# Patient Record
Sex: Female | Born: 1950 | Race: White | Hispanic: No | Smoking: Never smoker
Health system: Southern US, Community
[De-identification: ages and names within clinical notes are randomized; demographics above are authoritative.]

## PROBLEM LIST (undated history)

## (undated) DIAGNOSIS — M1990 Unspecified osteoarthritis, unspecified site: Secondary | ICD-10-CM

## (undated) DIAGNOSIS — M549 Dorsalgia, unspecified: Secondary | ICD-10-CM

## (undated) DIAGNOSIS — L719 Rosacea, unspecified: Secondary | ICD-10-CM

## (undated) DIAGNOSIS — T7840XA Allergy, unspecified, initial encounter: Secondary | ICD-10-CM

## (undated) DIAGNOSIS — E559 Vitamin D deficiency, unspecified: Secondary | ICD-10-CM

## (undated) DIAGNOSIS — R000 Tachycardia, unspecified: Secondary | ICD-10-CM

## (undated) HISTORY — DX: Dorsalgia, unspecified: M54.9

## (undated) HISTORY — DX: Vitamin D deficiency, unspecified: E55.9

## (undated) HISTORY — DX: Unspecified osteoarthritis, unspecified site: M19.90

## (undated) HISTORY — PX: COLONOSCOPY: SHX174

## (undated) HISTORY — DX: Tachycardia, unspecified: R00.0

## (undated) HISTORY — PX: OTHER SURGICAL HISTORY: SHX169

## (undated) HISTORY — DX: Allergy, unspecified, initial encounter: T78.40XA

---
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
## 1977-12-02 HISTORY — PX: PARTIAL HYSTERECTOMY: SHX80

## 2010-03-28 LAB — HM COLONOSCOPY

## 2012-12-07 LAB — HM MAMMOGRAPHY

## 2015-03-03 DIAGNOSIS — R21 Rash and other nonspecific skin eruption: Secondary | ICD-10-CM | POA: Diagnosis not present

## 2015-03-03 DIAGNOSIS — B351 Tinea unguium: Secondary | ICD-10-CM | POA: Diagnosis not present

## 2015-03-03 DIAGNOSIS — B372 Candidiasis of skin and nail: Secondary | ICD-10-CM | POA: Diagnosis not present

## 2015-03-14 DIAGNOSIS — T25232A Burn of second degree of left toe(s) (nail), initial encounter: Secondary | ICD-10-CM | POA: Diagnosis not present

## 2015-03-22 DIAGNOSIS — B881 Tungiasis [sandflea infestation]: Secondary | ICD-10-CM | POA: Diagnosis not present

## 2015-09-19 DIAGNOSIS — H43821 Vitreomacular adhesion, right eye: Secondary | ICD-10-CM | POA: Diagnosis not present

## 2015-09-19 DIAGNOSIS — H43392 Other vitreous opacities, left eye: Secondary | ICD-10-CM | POA: Diagnosis not present

## 2015-12-29 DIAGNOSIS — Z23 Encounter for immunization: Secondary | ICD-10-CM | POA: Diagnosis not present

## 2016-03-06 DIAGNOSIS — L718 Other rosacea: Secondary | ICD-10-CM | POA: Diagnosis not present

## 2016-03-30 ENCOUNTER — Emergency Department (INDEPENDENT_AMBULATORY_CARE_PROVIDER_SITE_OTHER)
Admission: EM | Admit: 2016-03-30 | Discharge: 2016-03-30 | Disposition: A | Discharge status: Home / Self Care | Payer: Medicare Other | Source: Home / Self Care

## 2016-03-30 ENCOUNTER — Emergency Department (INDEPENDENT_AMBULATORY_CARE_PROVIDER_SITE_OTHER): Payer: Medicare Other

## 2016-03-30 ENCOUNTER — Encounter: Payer: Self-pay | Admitting: Emergency Medicine

## 2016-03-30 DIAGNOSIS — S93402A Sprain of unspecified ligament of left ankle, initial encounter: Secondary | ICD-10-CM

## 2016-03-30 DIAGNOSIS — M79672 Pain in left foot: Secondary | ICD-10-CM | POA: Diagnosis not present

## 2016-03-30 DIAGNOSIS — S99922A Unspecified injury of left foot, initial encounter: Secondary | ICD-10-CM | POA: Diagnosis not present

## 2016-03-30 DIAGNOSIS — M25572 Pain in left ankle and joints of left foot: Secondary | ICD-10-CM | POA: Diagnosis not present

## 2016-03-30 DIAGNOSIS — M7989 Other specified soft tissue disorders: Secondary | ICD-10-CM | POA: Diagnosis not present

## 2016-03-30 DIAGNOSIS — S99912A Unspecified injury of left ankle, initial encounter: Secondary | ICD-10-CM | POA: Diagnosis not present

## 2016-03-30 NOTE — Discharge Instructions (Signed)
Apply ice pack for 30 minutes every 1 to 2 hours today and tomorrow.  Elevate.  Use crutches for 3 to 5 days.  Wear Ace wrap until swelling decreases.  Wear brace for about 2 to 3 weeks.  Begin range of motion and stretching exercises in about 5 days as per instruction sheet.  May take Ibuprofen 200mg , 4 tabs every 8 hours with food.    Ankle Sprain An ankle sprain is an injury to the strong, fibrous tissues (ligaments) that hold the bones of your ankle joint together.  CAUSES An ankle sprain is usually caused by a fall or by twisting your ankle. Ankle sprains most commonly occur when you step on the outer edge of your foot, and your ankle turns inward. People who participate in sports are more prone to these types of injuries.  SYMPTOMS   Pain in your ankle. The pain may be present at rest or only when you are trying to stand or walk.  Swelling.  Bruising. Bruising may develop immediately or within 1 to 2 days after your injury.  Difficulty standing or walking, particularly when turning corners or changing directions. DIAGNOSIS  Your caregiver will ask you details about your injury and perform a physical exam of your ankle to determine if you have an ankle sprain. During the physical exam, your caregiver will press on and apply pressure to specific areas of your foot and ankle. Your caregiver will try to move your ankle in certain ways. An X-ray exam may be done to be sure a bone was not broken or a ligament did not separate from one of the bones in your ankle (avulsion fracture).  TREATMENT  Certain types of braces can help stabilize your ankle. Your caregiver can make a recommendation for this. Your caregiver may recommend the use of medicine for pain. If your sprain is severe, your caregiver may refer you to a surgeon who helps to restore function to parts of your skeletal system (orthopedist) or a physical therapist. [place] ice to your injury for 1-2 days or as  directed by your caregiver. Applying ice helps to reduce inflammation and pain.  Put ice in a plastic bag.  Place a towel between your skin and the bag.  Leave the ice on for 15-20 minutes at a time, every 2 hours while you are awake.  Only take over-the-counter or prescription medicines for pain, discomfort, or fever as directed by your caregiver.  Elevate your injured ankle above the level of your heart as much as possible for 2-3 days.  If your caregiver recommends crutches, use them as instructed. Gradually put weight on the affected ankle. Continue to use crutches or a cane until you can walk without feeling pain in your ankle.  If you have a plaster splint, wear the splint as directed by your caregiver. Do not rest it on anything harder than a pillow for the first 24 hours. Do not put weight on it. Do not get it wet. You may take it off to take a shower or bath.  You may have been given an elastic bandage to wear around your ankle to provide support. If the elastic bandage is too tight (you have numbness or tingling in your foot or your foot becomes cold and blue), adjust the bandage to make it comfortable.  If you have an air splint, you may blow more air into it or let air out to make it more comfortable. You may take  your splint off at night and before taking a shower or bath. Wiggle your toes in the splint several times per day to decrease swelling. SEEK MEDICAL CARE IF:   You have rapidly increasing bruising or swelling.  Your toes feel extremely cold or you lose feeling in your foot.  Your pain is not relieved with medicine. SEEK IMMEDIATE MEDICAL CARE IF:  Your toes are numb or blue.  You have severe pain that is increasing. MAKE SURE YOU:   Understand these instructions.  Will watch your condition.  Will get help right away if you are not doing well or get worse.   This information is not intended to replace advice given to you by your health care provider. Make  sure you discuss any questions you have with your health care provider.   Document Released: 11/18/2005 Document Revised: 12/09/2014 Document Reviewed: 11/30/2011 Elsevier Interactive Patient Education [employer].

## 2016-03-30 NOTE — ED Provider Notes (Signed)
CSN: [id number]     Arrival date & time 03/30/16  [id number] History   None    Chief Complaint  Patient presents with  . Ankle Pain  . Foot Pain      HPI Comments: Patient fell and inverted her left foot/ankle yesterday, and has had persistent pain in her left foot/ankle.  Patient is a 65 y.o. female presenting with ankle pain. The history is provided by the patient.  Ankle Pain Location:  Ankle and foot Time since incident:  1 day Injury: yes   Mechanism of injury: fall   Ankle location:  L ankle Foot location:  L foot Pain details:    Quality:  Aching   Radiates to:  Does not radiate   Severity:  Moderate   Onset quality:  Sudden   Duration:  1 day   Timing:  Constant   Progression:  Unchanged Chronicity:  New Dislocation: no   Prior injury to area:  No Worsened by:  Bearing weight Ineffective treatments:  Ice and compression Associated symptoms: decreased ROM, stiffness and swelling   Associated symptoms: no back pain, no muscle weakness, no numbness and no tingling   Risk factors: obesity     History reviewed. No pertinent past medical history. Past Surgical History  Procedure Laterality Date  . Tonsillectomy    . Dilation curretage     Family History  Problem Relation Age of Onset  . Diabetes Paternal Uncle    Social History  Substance Use Topics  . Smoking status: Never Smoker   . Smokeless tobacco: None  . Alcohol Use: Yes   OB History    No data available     Review of Systems  Musculoskeletal: Positive for stiffness. Negative for back pain.  All other systems reviewed and are negative.   Allergies  Shellfish allergy  Home Medications   Prior to Admission medications   Not on File   Meds Ordered and Administered this Visit  Medications - No data to display  BP 124/76 mmHg  Pulse 73  Temp(Src) 98.6 F (37 C) (Oral)  Resp 16  Ht 5\' 10"  (1.778 m)  Wt 223 lb (101.152 kg)  BMI 32.00 kg/m2  SpO2 98% No data found.   Physical Exam    Constitutional: She is oriented to person, place, and time. She appears well-developed and well-nourished. No distress.  Patient is obese (BMI 32.0)  HENT:  Head: Atraumatic.  Eyes: Pupils are equal, round, and reactive to light.  Cardiovascular: Normal heart sounds.   Pulmonary/Chest: Breath sounds normal.  Musculoskeletal:       Left ankle: She exhibits decreased range of motion. She exhibits no swelling, no ecchymosis, no deformity and no laceration. Tenderness. Lateral malleolus and AITFL tenderness found. No head of 5th metatarsal tenderness found. Achilles tendon normal.       Left foot: There is tenderness and bony tenderness. There is normal range of motion, no swelling, normal capillary refill, no crepitus, no deformity and no laceration.       Feet:  Neurological: She is alert and oriented to person, place, and time.  Skin: Skin is warm and dry.  Nursing note and vitals reviewed.   ED Course  Procedures none   Imaging Review Dg Ankle Complete Left  03/30/2016  CLINICAL DATA:  Left ankle injury yesterday, posterior pain. EXAM: LEFT ANKLE COMPLETE - 3+ VIEW COMPARISON:  None. FINDINGS: Osseous alignment is normal. Bone mineralization appears normal. No fracture line or displaced fracture fragment seen. Ankle  mortise is symmetric. Soft tissues about the left ankle are unremarkable. IMPRESSION: Negative. Electronically Signed   By: Franki Cabot M.D.   On: 03/30/2016 10:12   Dg Foot Complete Left  03/30/2016  CLINICAL DATA:  Left foot injury yesterday, lateral pain with swelling. EXAM: LEFT FOOT - COMPLETE 3+ VIEW COMPARISON:  None. FINDINGS: Osseous alignment is normal. Perhaps mild diffuse osteopenia, bone mineralization otherwise normal. No fracture line or displaced fracture fragment seen. No significant degenerative change. Soft tissues about the left foot are unremarkable. IMPRESSION: Negative. Electronically Signed   By: Franki Cabot M.D.   On: 03/30/2016 10:11      MDM    1. Left ankle sprain, initial encounter    Ace wrap applied.  Dispensed AirCast stirrup splint and crutches. Apply ice pack for 30 minutes every 1 to 2 hours today and tomorrow.  Elevate.  Use crutches for 3 to 5 days.  Wear Ace wrap until swelling decreases.  Wear brace for about 2 to 3 weeks.  Begin range of motion and stretching exercises in about 5 days as per instruction sheet.  May take Ibuprofen 200mg , 4 tabs every 8 hours with food.  Followup with Dr. Aundria Mems or Dr. Lynne Leader ([facility]) if not improving about two weeks.     Kandra Nicolas, MD 04/09/16 [id number]

## 2016-03-30 NOTE — ED Notes (Signed)
Patient fell and rolled left foot and ankle yesterday; has elevated, iced and ace wrapped it but still painful. No recent OTCs.

## 2016-05-02 DIAGNOSIS — S93492A Sprain of other ligament of left ankle, initial encounter: Secondary | ICD-10-CM | POA: Diagnosis not present

## 2016-05-02 DIAGNOSIS — S93422A Sprain of deltoid ligament of left ankle, initial encounter: Secondary | ICD-10-CM | POA: Diagnosis not present

## 2016-05-02 DIAGNOSIS — M1711 Unilateral primary osteoarthritis, right knee: Secondary | ICD-10-CM | POA: Diagnosis not present

## 2016-05-02 DIAGNOSIS — M25562 Pain in left knee: Secondary | ICD-10-CM | POA: Diagnosis not present

## 2016-05-28 ENCOUNTER — Encounter: Payer: Self-pay | Admitting: *Deleted

## 2016-05-28 ENCOUNTER — Emergency Department (INDEPENDENT_AMBULATORY_CARE_PROVIDER_SITE_OTHER)
Admission: EM | Admit: 2016-05-28 | Discharge: 2016-05-28 | Disposition: A | Discharge status: Home / Self Care | Payer: Medicare Other | Source: Home / Self Care | Attending: Family Medicine | Admitting: Family Medicine

## 2016-05-28 DIAGNOSIS — S91311A Laceration without foreign body, right foot, initial encounter: Secondary | ICD-10-CM | POA: Diagnosis not present

## 2016-05-28 HISTORY — DX: Rosacea, unspecified: L71.9

## 2016-05-28 MED ORDER — LIDOCAINE-EPINEPHRINE-TETRACAINE (LET) SOLUTION
3.0000 mL | Freq: Once | NASAL | Status: AC
  Administered 2016-05-28: 3 mL via TOPICAL

## 2016-05-28 NOTE — Discharge Instructions (Signed)
You may take acetaminophen and ibuprofen as needed for pain. You should keep bandage in place for 24 hours, then removal and gently clean with soap and water. You may use over the counter antibiotic ointment for up to 5 days, any longer might cause skin irritation.  Keep covered with a bandage or gauze when wearing shoes that may rub over the area.

## 2016-05-28 NOTE — ED Provider Notes (Signed)
CSN: [id number]     Arrival date & time 05/28/16  1806 History   First MD Initiated Contact with Patient 05/28/16 1812     Chief Complaint  Patient presents with  . Extremity Laceration   (Consider location/radiation/quality/duration/timing/severity/associated sxs/prior Treatment) HPI  Donna [name] is a 65 y.o. female presenting to UC with c/o laceration to the top of her Right foot that occurred about 88min PTA.  Pt reports dropping a sharp metal quilting tool "kind of like a pizza cutter" onto her foot.  Bleeding controlled with bandage but states back when bandage removed.  Pain is minimal at this time. Pt is unsure of last tetanus and wants to check with her PCP before getting a booster.    Past Medical History  Diagnosis Date  . Rosacea    Past Surgical History  Procedure Laterality Date  . Tonsillectomy    . Dilation curretage     Family History  Problem Relation Age of Onset  . Diabetes Paternal Uncle    Social History  Substance Use Topics  . Smoking status: Never Smoker   . Smokeless tobacco: None  . Alcohol Use: Yes   OB History    No data available     Review of Systems  Musculoskeletal: Negative for myalgias and arthralgias.  Skin: Positive for wound. Negative for rash.    Allergies  Shellfish allergy  Home Medications   Prior to Admission medications   Medication Sig Start Date End Date Taking? Authorizing Provider  UNKNOWN TO PATIENT    Yes Historical Provider, MD  UNKNOWN TO PATIENT    Yes Historical Provider, MD   Meds Ordered and Administered this Visit   Medications  lidocaine-EPINEPHrine-tetracaine (LET) solution (3 mLs Topical Given 05/28/16 1846)    BP 160/93 mmHg  Pulse 84  Temp(Src) 98.2 F (36.8 C) (Oral)  Resp 18  Ht 5\' 10"  (1.778 m)  Wt 215 lb (97.523 kg)  BMI 30.85 kg/m2  SpO2 96% No data found.   Physical Exam  Constitutional: She is oriented to person, place, and time. She appears well-developed and well-nourished.   HENT:  Head: Normocephalic and atraumatic.  Eyes: EOM are normal.  Neck: Normal range of motion.  Cardiovascular: Normal rate.   Pulses:      Dorsalis pedis pulses are 2+ on the right side.  Right little toe: cap refill < 3 seconds  Pulmonary/Chest: Effort normal.  Musculoskeletal: Normal range of motion.  Neurological: She is alert and oriented to person, place, and time.  Right little toe: normal sensation   Skin: Skin is warm and dry.  Right foot, dorsal aspect over 5th metatarsal: 1.5cm laceration, oozing red blood. No foreign bodies seen or palpated.   Psychiatric: She has a normal mood and affect. Her behavior is normal.  Nursing note and vitals reviewed.   ED Course  .Marland KitchenLaceration Repair Date/Time: 05/28/2016 7:51 PM Performed by: Noland Fordyce Authorized by: Theone Murdoch A Consent: Verbal consent obtained. Risks and benefits: risks, benefits and alternatives were discussed Consent given by: patient Patient understanding: patient states understanding of the procedure being performed Patient consent: the patient's understanding of the procedure matches consent given Required items: required blood products, implants, devices, and special equipment available Patient identity confirmed: verbally with patient Body area: lower extremity Location details: right foot Laceration length: 1.5 cm Foreign bodies: no foreign bodies Tendon involvement: none Nerve involvement: none Vascular damage: no Anesthesia: local infiltration Local anesthetic: LET (lido,epi,tetracaine) Anesthetic total: 2 ml Patient sedated: no Preparation:  Patient was prepped and draped in the usual sterile fashion. Irrigation solution: saline Irrigation method: syringe Amount of cleaning: standard Debridement: none Degree of undermining: none Skin closure: 4-0 Prolene Number of sutures: 3 Technique: simple Approximation: close Approximation difficulty: simple Dressing: 4x4 sterile gauze and  antibiotic ointment Patient tolerance: Patient tolerated the procedure well with no immediate complications   (including critical care time)  Labs Review Labs Reviewed - No data to display  Imaging Review No results found.    MDM   1. Foot laceration, right, initial encounter    Laceration to Right foot. PMS in tact. Laceration sutured closed as noted above. Pt to call back or return tomorrow if she needs Tdap update depending on response from her PCP.  Home care instructions for sutured wound care provided. Return in 10-14 days at [facility] or f/u with PCP for suture removal. Sooner if signs of infection.  Patient verbalized understanding and agreement with treatment plan.     Noland Fordyce, PA-C 05/28/16 1953

## 2016-05-28 NOTE — ED Notes (Signed)
Pt c/o RT foot laceration x 45 minutes ago. She reports dropping a quilting tool on it. Tdap status unknown. She would like to check with her PCP about her last Tdap date.

## 2016-06-03 DIAGNOSIS — L821 Other seborrheic keratosis: Secondary | ICD-10-CM | POA: Diagnosis not present

## 2016-06-03 DIAGNOSIS — D225 Melanocytic nevi of trunk: Secondary | ICD-10-CM | POA: Diagnosis not present

## 2016-06-10 ENCOUNTER — Ambulatory Visit (INDEPENDENT_AMBULATORY_CARE_PROVIDER_SITE_OTHER): Payer: Medicare Other | Admitting: Physician Assistant

## 2016-06-10 ENCOUNTER — Encounter: Payer: Self-pay | Admitting: Physician Assistant

## 2016-06-10 VITALS — BP 122/57 | HR 84 | Ht 70.0 in | Wt 231.0 lb

## 2016-06-10 DIAGNOSIS — IMO0002 Reserved for concepts with insufficient information to code with codable children: Secondary | ICD-10-CM | POA: Insufficient documentation

## 2016-06-10 DIAGNOSIS — Z780 Asymptomatic menopausal state: Secondary | ICD-10-CM

## 2016-06-10 DIAGNOSIS — Z13220 Encounter for screening for lipoid disorders: Secondary | ICD-10-CM

## 2016-06-10 DIAGNOSIS — Z823 Family history of stroke: Secondary | ICD-10-CM

## 2016-06-10 DIAGNOSIS — E7800 Pure hypercholesterolemia, unspecified: Secondary | ICD-10-CM | POA: Insufficient documentation

## 2016-06-10 DIAGNOSIS — M25562 Pain in left knee: Secondary | ICD-10-CM | POA: Diagnosis not present

## 2016-06-10 DIAGNOSIS — Z131 Encounter for screening for diabetes mellitus: Secondary | ICD-10-CM | POA: Diagnosis not present

## 2016-06-10 DIAGNOSIS — M1712 Unilateral primary osteoarthritis, left knee: Secondary | ICD-10-CM | POA: Insufficient documentation

## 2016-06-10 DIAGNOSIS — M1710 Unilateral primary osteoarthritis, unspecified knee: Secondary | ICD-10-CM

## 2016-06-10 NOTE — Patient Instructions (Signed)
Labs: fast 8 hours before labs drawn. Nothing to eat or drink except water and/or black coffee.

## 2016-06-11 DIAGNOSIS — Z1231 Encounter for screening mammogram for malignant neoplasm of breast: Secondary | ICD-10-CM | POA: Diagnosis not present

## 2016-06-12 NOTE — Progress Notes (Signed)
Subjective:    Patient ID: Donna [name], female    DOB: 09-Apr-1951, 65 y.o.   MRN: [id number]  HPI Pt is a 65 yo female who presents to the clinic to establish care.   .. Active Ambulatory Problems    Diagnosis Date Noted  . Left knee pain 06/10/2016  . Primary osteoarthritis of knee 06/10/2016  . Laceration 06/10/2016  . Elevated LDL cholesterol level 06/10/2016   Resolved Ambulatory Problems    Diagnosis Date Noted  . No Resolved Ambulatory Problems   Past Medical History  Diagnosis Date  . Rosacea   . Tachycardia    .[name] Family History  Problem Relation Age of Onset  . Diabetes Paternal Uncle   . Breast cancer Sister   . Stroke Paternal Grandmother   . Liver disease Father    .[name] Social History   Social History  . Marital Status: Married    Spouse Name: N/A  . Number of Children: N/A  . Years of Education: N/A   Occupational History  . Not on file.   Social History Main Topics  . Smoking status: Never Smoker   . Smokeless tobacco: Not on file  . Alcohol Use: 0.0 oz/week    0 Standard drinks or equivalent per week     Comment: 0-1 drinks per week  . Drug Use: No  . Sexual Activity: Not Currently   Other Topics Concern  . Not on file   Social History Narrative    She was seen in UC for right foot laceration. She comes in today for suture removal. No complications.   Pt travels a lot and does not remember the last time she had labs.   She is also having some left knee pain for the past few months. Hx of OA in knees. Taking ibuprofen as needed which does help. She wants to be more active and walk more but the pain is restricting her. She remember twisting her ankle a few months back and wonders if could have twisted knee. Left knee has "given out" at times.    Review of Systems  All other systems reviewed and are negative.      Objective:   Physical Exam  Constitutional: She is oriented to person, place, and time. She appears well-developed  and well-nourished.  HENT:  Head: Normocephalic and atraumatic.  Cardiovascular: Normal rate, regular rhythm and normal heart sounds.   Pulmonary/Chest: Effort normal and breath sounds normal. She has no wheezes.  Musculoskeletal:  Left knee: +fluid wave.  NROM. Tenderness over medial joint space of left knee.  No laxity.  Negative mcmurrays. Negative anterior drawer.   Neurological: She is alert and oriented to person, place, and time.  Skin:  1.5cm well healed laceration with 3 stitches of the dorsal foot over 5th metatarsal.   Psychiatric: She has a normal mood and affect. Her behavior is normal.          Assessment & Plan:  Right foot laceration- 3 stitches removed without complication. Appears well-healed. Vitamin E encouraged. Per pt has had Tdap in last 6 years but no documentation.   Left knee pain- hx of OA. Discussed xray she declined today. Concerned she could have a meniscal tear. Exercises given. mobic given daily as needed. Discussed follow up with sports medicine there could be a   Screening labs ordered to check lipid and cmp. Hx of elevated LDL.   Needs mammogram and other health prevention. Pt needs CPE. Will wait to get records  to see everything that needs to be completed.

## 2016-06-17 ENCOUNTER — Encounter: Payer: Medicare Other | Admitting: Sports Medicine

## 2016-06-18 ENCOUNTER — Encounter: Payer: Self-pay | Admitting: Sports Medicine

## 2016-06-18 ENCOUNTER — Ambulatory Visit (INDEPENDENT_AMBULATORY_CARE_PROVIDER_SITE_OTHER): Payer: Medicare Other | Admitting: Sports Medicine

## 2016-06-18 DIAGNOSIS — Z780 Asymptomatic menopausal state: Secondary | ICD-10-CM | POA: Diagnosis not present

## 2016-06-18 DIAGNOSIS — E7800 Pure hypercholesterolemia, unspecified: Secondary | ICD-10-CM | POA: Diagnosis not present

## 2016-06-18 DIAGNOSIS — Z823 Family history of stroke: Secondary | ICD-10-CM | POA: Diagnosis not present

## 2016-06-18 DIAGNOSIS — Z131 Encounter for screening for diabetes mellitus: Secondary | ICD-10-CM | POA: Diagnosis not present

## 2016-06-18 DIAGNOSIS — M1712 Unilateral primary osteoarthritis, left knee: Secondary | ICD-10-CM | POA: Diagnosis not present

## 2016-06-18 DIAGNOSIS — Z13220 Encounter for screening for lipoid disorders: Secondary | ICD-10-CM | POA: Diagnosis not present

## 2016-06-18 NOTE — Progress Notes (Addendum)
   Subjective:    I'm seeing this patient as a consultation for:  Iran Planas, PA-C  CC: Left knee pain  HPI: This is a pleasant 65 year old female with years of left knee pain, moderate, persistent, localized at the medial joint line without radiation, no mechanical symptoms, she has seen an orthopedist in the past who suspected a torn meniscus. She's never had an injection.  Past medical history, Surgical history, Family history not pertinant except as noted below, Social history, Allergies, and medications have been entered into the medical record, reviewed, and no changes needed.   Review of Systems: No headache, visual changes, nausea, vomiting, diarrhea, constipation, dizziness, abdominal pain, skin rash, fevers, chills, night sweats, weight loss, swollen lymph nodes, body aches, joint swelling, muscle aches, chest pain, shortness of breath, mood changes, visual or auditory hallucinations.   Objective:   General: Well Developed, well nourished, and in no acute distress.  Neuro/Psych: Alert and oriented x3, extra-ocular muscles intact, able to move all 4 extremities, sensation grossly intact. Skin: Warm and dry, no rashes noted.  Respiratory: Not using accessory muscles, speaking in full sentences, trachea midline.  Cardiovascular: Pulses palpable, no extremity edema. Abdomen: Does not appear distended. Left Knee: Minimal swelling with tenderness at the medial joint line ROM normal in flexion and extension and lower leg rotation. Ligaments with solid consistent endpoints including ACL, PCL, LCL, MCL. Negative Mcmurray's and provocative meniscal tests. Non painful patellar compression. Patellar and quadriceps tendons unremarkable. Hamstring and quadriceps strength is normal.  Impression and Recommendations:   This case required medical decision making of moderate complexity.  I spent 25 minutes with this patient, greater than 50% was face-to-face time counseling regarding the  above diagnoses

## 2016-06-18 NOTE — Addendum Note (Signed)
Addended by: Silverio Decamp on: 06/18/2016 10:56 AM   Modules accepted: Miquel Dunn

## 2016-06-18 NOTE — Assessment & Plan Note (Signed)
Physical therapy, continue meloxicam, return for custom orthotics. [place], we can inject her before that if needed.

## 2016-06-19 LAB — COMPLETE METABOLIC PANEL WITH GFR
ALBUMIN: 3.8 g/dL (ref 3.6–5.1)
ALK PHOS: 95 U/L (ref 33–130)
ALT: 11 U/L (ref 6–29)
AST: 12 U/L (ref 10–35)
BILIRUBIN TOTAL: 0.6 mg/dL (ref 0.2–1.2)
BUN: 20 mg/dL (ref 7–25)
CALCIUM: 8.7 mg/dL (ref 8.6–10.4)
CHLORIDE: 106 mmol/L (ref 98–110)
CO2: 24 mmol/L (ref 20–31)
Creat: 0.92 mg/dL (ref 0.50–0.99)
GFR, Est African American: 75 mL/min (ref >=60)
GFR, Est Non African American: 65 mL/min (ref >=60)
GLUCOSE: 95 mg/dL (ref 65–99)
Potassium: 4.2 mmol/L (ref 3.5–5.3)
SODIUM: 138 mmol/L (ref 135–146)
Total Protein: 6.6 g/dL (ref 6.1–8.1)

## 2016-06-19 LAB — LIPID PANEL
Cholesterol: 192 mg/dL (ref 125–200)
HDL: 72 mg/dL (ref >=46)
LDL Cholesterol: 94 mg/dL (ref <130)
Total CHOL/HDL Ratio: 2.7 Ratio (ref <=5.0)
Triglycerides: 130 mg/dL (ref <150)
VLDL: 26 mg/dL (ref <30)

## 2016-06-25 ENCOUNTER — Encounter: Payer: Self-pay | Admitting: Sports Medicine

## 2016-06-25 ENCOUNTER — Ambulatory Visit (INDEPENDENT_AMBULATORY_CARE_PROVIDER_SITE_OTHER): Payer: Medicare Other | Admitting: Sports Medicine

## 2016-06-25 ENCOUNTER — Encounter: Payer: Self-pay | Admitting: Physician Assistant

## 2016-06-25 DIAGNOSIS — M1712 Unilateral primary osteoarthritis, left knee: Secondary | ICD-10-CM

## 2016-06-25 NOTE — Assessment & Plan Note (Signed)
Custom orthotics as above. 

## 2016-06-25 NOTE — Progress Notes (Signed)

## 2016-07-03 ENCOUNTER — Encounter: Payer: Self-pay | Admitting: Rehabilitative and Restorative Service Providers"

## 2016-07-03 ENCOUNTER — Ambulatory Visit (INDEPENDENT_AMBULATORY_CARE_PROVIDER_SITE_OTHER): Payer: Medicare Other | Admitting: Rehabilitative and Restorative Service Providers"

## 2016-07-03 DIAGNOSIS — M25562 Pain in left knee: Secondary | ICD-10-CM | POA: Diagnosis not present

## 2016-07-03 DIAGNOSIS — R29898 Other symptoms and signs involving the musculoskeletal system: Secondary | ICD-10-CM

## 2016-07-03 NOTE — Therapy (Addendum)
[place], [place], [address] Phone: [phone]   Fax:  [phone]  Physical Therapy Evaluation  Patient Details  Name: Donna [name] MRN: [id number] Date of Birth: 01-22-1951 Referring Provider: Dr. Sophronia Simas  Encounter Date: 07/03/2016      PT End of Session - 07/03/16 1400    Visit Number 1   Number of Visits 12   Date for PT Re-Evaluation 08/14/16   PT Start Time 1400   PT Stop Time 1456   PT Time Calculation (min) 56 min   Activity Tolerance Patient tolerated treatment well      Past Medical History:  Diagnosis Date  . Rosacea   . Tachycardia     Past Surgical History:  Procedure Laterality Date  . dilation curretage    . endometrial tumor    . NOSE SURGERY  1960  . PARTIAL HYSTERECTOMY  1979   removed one tube and one ovary   . TONSILLECTOMY  1957    There were no vitals filed for this visit.       Subjective Assessment - 07/03/16 1403    Subjective Patient reports that she has been having Lt knee pain which began sometime after Lt ankle sprain 03/29/16. She was on crutches and in an air cast for about 2 weeks.  Lt knee began to hurt when she was full weight bearing on the Lt LE again. She was seen by MD and xrays of knee showed some mild arthritis. She returned to treadmill and more normal activites and pain continued/increased. She was seen by Dr. Dianah Field and diagnosed with arthritis and referred to PT.    Pertinent History Some Rt knee pain on an intermittent basis; arthritis; some heart problems non-life threatening    How long can you stand comfortably? 15 min    How long can you walk comfortably? 30 min    Diagnostic tests xray   Patient Stated Goals improve knee pain to be able to walk and exercise and go up and down stairs    Currently in Pain? No/denies   Pain Location Knee   Pain Orientation Left   Pain Descriptors / Indicators Sharp;Sore;Tightness    Pain Type Acute pain   Aggravating Factors  stairs; twisting   Pain Relieving Factors rest            [facility] PT Assessment - 07/03/16 0001      Assessment   Medical Diagnosis Lt knee pain    Referring Provider Dr. Sophronia Simas   Onset Date/Surgical Date 03/29/16   Hand Dominance Right   Next MD Visit 07/26/16   Prior Therapy none     Precautions   Precautions None   Precaution Comments no twisting of knee      Balance Screen   Has the patient fallen in the past 6 months Yes   How many times? 1   Has the patient had a decrease in activity level because of a fear of falling?  No   Is the patient reluctant to leave their home because of a fear of falling?  No     Home Environment   Additional Comments single level home level entry      Prior Function   Level of Independence Independent   Vocation Retired   Chief Financial Officer - retired 2013   Leisure household chores; Psychologist, occupational work; gym 3 days/wk cardio and machines; enjoys walking      Observation/Other Assessments   Focus  on Therapeutic Outcomes (FOTO)  52% limitation     Sensation   Additional Comments WNL's per pt report      Posture/Postural Control   Posture Comments sits with Lt knee crossed over Rt      AROM   Overall AROM Comments WFL's bilat LE's Lt equal to Rt      Strength   Overall Strength Comments 5/5 bilat LE's no pain      Flexibility   Hamstrings tight Rt 85 deg; Lt 80 deg   Quadriceps tight Rt 100 deg; Lt 95 deg heel to buttock in prone    ITB mild tightness bilat    Piriformis mild tightness Lt > Rt     Palpation   Patella mobility WFL's    Palpation comment tender to palpation along the medial joint line and just distal medially Lt knee      Ambulation/Gait   Gait Comments WFL's no noticable limp                    [facility] Adult PT Treatment/Exercise - 07/03/16 0001      Knee/Hip Exercises: Stretches   Passive Hamstring Stretch 3 reps;30  seconds   ITB Stretch 3 reps;30 seconds   Other Knee/Hip Stretches HS adductor stretch 30 sec x 2 Lt      Knee/Hip Exercises: Supine   Quad Sets Left;1 set;10 reps  small flat pillow under knee      Cryotherapy   Number Minutes Cryotherapy 15 Minutes   Cryotherapy Location Knee  Lt   Type of Cryotherapy Ice pack     Iontophoresis   Type of Iontophoresis Dexamethasone   Location medial Lt knee   Dose 120 mAmp   Time 8 hours      Ankle Exercises: Stretches   Soleus Stretch 2 reps;30 seconds   Gastroc Stretch 2 reps;30 seconds                PT Education - 07/03/16 1451    Education provided Yes   Education Details HEP; ionto   Person(s) Educated Patient   Methods Explanation;Demonstration;Tactile cues;Verbal cues;Handout   Comprehension Verbalized understanding;Returned demonstration;Verbal cues required;Tactile cues required             PT Long Term Goals - 07/03/16 1459      PT LONG TERM GOAL #1   Title Decrease pain and tenderness to palpation through the Lt medial knee area 08/15/16   Time 6   Period Weeks   Status New     PT LONG TERM GOAL #2   Title Patient reports increased standing and walking tolerance to 30-60 min 08/15/16   Time 6   Period Weeks   Status New     PT LONG TERM GOAL #3   Title Ascend and descend steps with one railing with min to no pain 08/15/16   Time 6   Period Weeks   Status New     PT LONG TERM GOAL #4   Title Independent in HEP 08/15/16   Time 6   Period Weeks   Status New     PT LONG TERM GOAL #5   Title Improve FOTO to </=39% limitation 08/15/16   Time 6   Period Weeks   Status New               Plan - 07/03/16 1456    Clinical Impression Statement Patient presents with 2 month history of Lt knee pain following Lt ankle sprain  03/29/16. She has pain in the medial aspect of the Lt knee to palpation and pain with functional activities including prolonged standing; walking and ascending/descending steps.     Rehab Potential Good   PT Frequency 2x / week   PT Duration 6 weeks   PT Treatment/Interventions Patient/family education;ADLs/Self Care Home Management;Cryotherapy;Electrical Stimulation;Iontophoresis 4mg /ml Dexamethasone;Ultrasound;Moist Heat;Manual techniques;Dry needling;Therapeutic activities;Therapeutic exercise   PT Next Visit Plan progress with strengthening Lt LE; assess response to ionto; modalities as indicated    Consulted and Agree with Plan of Care Patient      Patient will benefit from skilled therapeutic intervention in order to improve the following deficits and impairments:  Improper body mechanics, Pain, Decreased activity tolerance, Abnormal gait  Visit Diagnosis: Left knee pain - Plan: PT plan of care cert/re-cert  Other symptoms and signs involving the musculoskeletal system     Problem List Patient Active Problem List   Diagnosis Date Noted  . Left knee pain 06/10/2016  . Primary osteoarthritis of left knee 06/10/2016  . Laceration 06/10/2016  . Elevated LDL cholesterol level 06/10/2016    Keyoni Lapinski Nilda Simmer PT, MPH  07/03/2016, 3:07 PM  [facility] [place], [place], [address] Phone: [phone]   Fax:  [phone]  Name: Donna [name] MRN: [id number] Date of Birth: Dec 28, 1950

## 2016-07-03 NOTE — Patient Instructions (Addendum)
HIP: Hamstrings - Supine   Place strap around foot. Raise leg up, keeping knee straight.  Bend opposite knee to protect back if indicated. Hold 30 seconds. 3 reps per set, 2-3 sets per day    Outer Hip Stretch: Reclined IT Band Stretch (Strap)   Strap around one foot, pull leg across body until you feel a pull or stretch, with shoulders on mat. Hold for 30 seconds. Repeat 3 times each leg. 2-3 times/day.  Repeat - bringing leg out to side  Hold 30 sec 3 reps  2-3 times per day    Quads / HF, Prone   Lie face down. Grasp one ankle with same-side hand. Use towel if needed to reach. Gently pull foot toward buttock.  Hold 30 seconds. Repeat 3 times per session. Do 2-3 sessions per day.  Quad Set    With other leg bent, foot flat, slowly tighten muscles on thigh of straight leg while counting out loud to __10__. Repeat with other leg. Repeat __10__ times. Do __2__ sessions per day.   Achilles / Gastroc, Standing    Stand, right foot behind, heel on floor and turned slightly out, leg straight, forward leg bent. Move hips forward. Hold _30__ seconds. Repeat _3__ times per session. Do _2-3__ sessions per day.  Achilles / Soleus, Standing    Stand, right foot behind, heel on floor and turned slightly out. Lower hips and bend knees. Hold _30__ seconds. Repeat _3__ times per session. Do __2-3_ sessions per day.      Stretch out strap - [employer]

## 2016-07-10 ENCOUNTER — Ambulatory Visit (INDEPENDENT_AMBULATORY_CARE_PROVIDER_SITE_OTHER): Payer: Medicare Other | Admitting: Rehabilitative and Restorative Service Providers"

## 2016-07-10 ENCOUNTER — Encounter (INDEPENDENT_AMBULATORY_CARE_PROVIDER_SITE_OTHER): Payer: Self-pay

## 2016-07-10 ENCOUNTER — Encounter: Payer: Self-pay | Admitting: Rehabilitative and Restorative Service Providers"

## 2016-07-10 DIAGNOSIS — R29898 Other symptoms and signs involving the musculoskeletal system: Secondary | ICD-10-CM | POA: Diagnosis not present

## 2016-07-10 DIAGNOSIS — M25562 Pain in left knee: Secondary | ICD-10-CM | POA: Diagnosis present

## 2016-07-10 NOTE — Therapy (Signed)
[place], [place], [address] Phone: [phone]   Fax:  [phone]  Physical Therapy Treatment  Patient Details  Name: Donna [name] MRN: [id number] Date of Birth: March 26, 1951 Referring Provider: Dr. Sophronia Simas  Encounter Date: 07/10/2016      PT End of Session - 07/10/16 0850    Visit Number 2   Number of Visits 12   Date for PT Re-Evaluation 08/14/16   PT Start Time 0850   PT Stop Time 0945   PT Time Calculation (min) 55 min   Activity Tolerance Patient tolerated treatment well      Past Medical History:  Diagnosis Date  . Rosacea   . Tachycardia     Past Surgical History:  Procedure Laterality Date  . dilation curretage    . endometrial tumor    . NOSE SURGERY  1960  . PARTIAL HYSTERECTOMY  1979   removed one tube and one ovary   . TONSILLECTOMY  1957    There were no vitals filed for this visit.      Subjective Assessment - 07/10/16 0853    Subjective Patient reports that her exercises went "pretty good".    Currently in Pain? Yes   Pain Score 2    Pain Location Knee   Pain Orientation Left   Pain Descriptors / Indicators Sore;Tightness   Pain Type Acute pain   Pain Onset More than a month ago   Pain Frequency Intermittent                         [facility] Adult PT Treatment/Exercise - 07/10/16 0001      Knee/Hip Exercises: Stretches   Passive Hamstring Stretch 3 reps;30 seconds   ITB Stretch 3 reps;30 seconds   Other Knee/Hip Stretches HS adductor stretch 30 sec x 2 Lt      Knee/Hip Exercises: Aerobic   Stationary Bike Nustep L4 x 5 min      Knee/Hip Exercises: Standing   Heel Raises Both;10 reps;1 second;2 seconds   Forward Step Up Left;10 reps  4 " step    Forward Step Up Limitations pain reported Lt knee    SLS Lt LE for 10-30 sec x 4 trials - pt reported pain in the Lt knee with longer trials of SLS      Knee/Hip Exercises: Supine   Quad Sets Left;1 set;10 reps  small flat pillow under knee    Hip Adduction Isometric Strengthening;Both;10 reps  ball squeeze    Bridges Strengthening;Both;10 reps  3-5 sec hold    Straight Leg Raise with External Rotation Strengthening;Left;10 reps  3-5 sec hold - ~12 in lift    Other Supine Knee/Hip Exercises clam w/red tb      Iontophoresis   Type of Iontophoresis Dexamethasone   Location medial Lt knee   Dose 120 mAmp   Time 8 hours      Ankle Exercises: Stretches   Soleus Stretch 2 reps;30 seconds   Gastroc Stretch 2 reps;30 seconds                PT Education - 07/10/16 0933    Education provided Yes   Education Details HEP   Person(s) Educated Patient   Methods Explanation;Demonstration;Tactile cues;Verbal cues   Comprehension Verbalized understanding;Returned demonstration;Verbal cues required;Tactile cues required             PT Long Term Goals - 07/10/16 0916      PT  LONG TERM GOAL #1   Title Decrease pain and tenderness to palpation through the Lt medial knee area 08/15/16   Time 6   Period Weeks   Status On-going     PT LONG TERM GOAL #2   Title Patient reports increased standing and walking tolerance to 30-60 min 08/15/16   Time 6   Period Weeks   Status On-going     PT LONG TERM GOAL #3   Title Ascend and descend steps with one railing with min to no pain 08/15/16   Time 6   Period Weeks   Status On-going     PT LONG TERM GOAL #4   Title Independent in HEP 08/15/16   Time 6   Period Weeks   Status On-going     PT LONG TERM GOAL #5   Title Improve FOTO to </=39% limitation 08/15/16   Time 6   Period Weeks   Status On-going               Plan - 07/10/16 1140    Clinical Impression Statement Patient reports increased pain today - not sure why. She was busy yesterday but was mostly sitting for volunteer activities. She didn't have time to work on her exercises much. Not sure the ionto patch helped. Part of the patch came  loose from her leg. She didn't remove it but just left it where it was. Patient tolerated most oexercises without increased pain but had difficulty with weight bearing activities. She will add those cautiously for home. Cleaned skin well before applying ionto so that will hopefully help with patch sticking. Continue rehab - only 2nd visit.    Rehab Potential Good   PT Frequency 2x / week   PT Duration 6 weeks   PT Treatment/Interventions Patient/family education;ADLs/Self Care Home Management;Cryotherapy;Electrical Stimulation;Iontophoresis 4mg /ml Dexamethasone;Ultrasound;Moist Heat;Manual techniques;Dry needling;Therapeutic activities;Therapeutic exercise   PT Next Visit Plan progress with strengthening Lt LE; assess response to ionto; modalities as indicated    Consulted and Agree with Plan of Care Patient      Patient will benefit from skilled therapeutic intervention in order to improve the following deficits and impairments:  Improper body mechanics, Pain, Decreased activity tolerance, Abnormal gait  Visit Diagnosis: Left knee pain  Other symptoms and signs involving the musculoskeletal system     Problem List Patient Active Problem List   Diagnosis Date Noted  . Left knee pain 06/10/2016  . Primary osteoarthritis of left knee 06/10/2016  . Laceration 06/10/2016  . Elevated LDL cholesterol level 06/10/2016    Zakyia Gagan Nilda Simmer PT, MPH  07/10/2016, 12:25 PM  [facility] [place], [place], [address] Phone: [phone]   Fax:  [phone]  Name: Donna [name] MRN: [id number] Date of Birth: 04/03/1951

## 2016-07-10 NOTE — Patient Instructions (Addendum)
Straight Leg Raise: With External Leg Rotation    Lie on back with right leg straight, opposite leg bent. Rotate straight leg out and lift _12___ inches. Repeat __10__ times per set. Do _1-3___ sets per session. Do __1-2__ sessions per day.    Strengthening: Hip Adduction - Isometric    With ball or folded pillow between knees, squeeze knees together. Hold __3-5__ seconds. Repeat _10___ times per set. Do __1-3__ sets per session. Do _1-2___ sessions per day.    Strengthening: Hip Abductor - Resisted    With band looped around both legs above knees, push thighs apart. Repeat __10__ times per set. Do _1-3___ sets per session. Do _1-2___ sessions per day.  Bridging    Slowly raise buttocks from floor, keeping stomach tight. Repeat _10___ times per set. Do _1-3___ sets per session. Do __1-2__ sessions per day.    Heel Raise: Bilateral (Standing)    Rise on balls of feet. Repeat _10__ times per set. Do __1-3__ sets per session. Do _1-2___ sessions per day.     Balance: Unilateral    Attempt to balance on left leg, eyes open. Hold _10___ seconds. Repeat __10__ times per set. Do __1-3__ sets per session. Do ___1-2_ sessions per day. Perform exercise with eyes closed.

## 2016-07-12 ENCOUNTER — Encounter: Payer: Medicare Other | Admitting: Rehabilitative and Restorative Service Providers"

## 2016-07-15 ENCOUNTER — Ambulatory Visit (INDEPENDENT_AMBULATORY_CARE_PROVIDER_SITE_OTHER): Payer: Medicare Other | Admitting: Rehabilitative and Restorative Service Providers"

## 2016-07-15 ENCOUNTER — Encounter: Payer: Self-pay | Admitting: Rehabilitative and Restorative Service Providers"

## 2016-07-15 DIAGNOSIS — R29898 Other symptoms and signs involving the musculoskeletal system: Secondary | ICD-10-CM | POA: Diagnosis not present

## 2016-07-15 DIAGNOSIS — M25562 Pain in left knee: Secondary | ICD-10-CM | POA: Diagnosis present

## 2016-07-15 NOTE — Patient Instructions (Addendum)
Strengthening: Hip Abduction (Side-Lying)    Tighten muscles on front of left thigh, then lift leg __12-15__ inches from surface, keeping knee locked.  Repeat __10__ times per set. Do __1-3__ sets per session. Do __1_ sessions per day.  As above tap forward 2-3 reps/tap back 2-3 reps  Same reps as above     Functional Quadriceps: Chair Squat    Keeping feet flat on floor, shoulder width apart, squat as low as is comfortable. Use support as necessary. Repeat __10__ times per set. Do __1-3__ sets per session. Do __1-2__ sessions per day.   Forward    Facing step, place one leg on step, flexed at hip. Step up slowly, bringing hips in line with knee and shoulder. Bring other foot onto step. Reverse process to step back down. Repeat with other leg. Do _10___ repetitions, _1-3___ sets. Start with 1-2 inch book and gradually progress with height of step

## 2016-07-15 NOTE — Therapy (Addendum)
[place], [place], [address] Phone: [phone]   Fax:  [phone]  Physical Therapy Treatment  Patient Details  Name: Donna [name] MRN: [id number] Date of Birth: [birth year] Referring Provider: Dr Dianah Field  Encounter Date: 07/15/2016      PT End of Session - 07/15/16 0904    Visit Number 3   Number of Visits 12   Date for PT Re-Evaluation 08/14/16   PT Start Time 0847   PT Stop Time 0945   PT Time Calculation (min) 58 min   Activity Tolerance Patient tolerated treatment well      Past Medical History:  Diagnosis Date  . Rosacea   . Tachycardia     Past Surgical History:  Procedure Laterality Date  . dilation curretage    . endometrial tumor    . NOSE SURGERY  1960  . PARTIAL HYSTERECTOMY  1979   removed one tube and one ovary   . TONSILLECTOMY  1957    There were no vitals filed for this visit.      Subjective Assessment - 07/15/16 0901    Subjective Some days a bit better some days worse. Really bad day Friday - not sure why. Concerned with the cost of her therapy and wants to hold PT until she sees Dr. Georgina Snell next week. She does notice that her balance is better. Pain is intermittent. Can not tell a difference in the ionto patches.    Currently in Pain? Yes   Pain Score 4    Pain Location Knee   Pain Orientation Left   Pain Descriptors / Indicators Tightness;Sore   Pain Type Acute pain   Pain Onset More than a month ago   Pain Frequency Intermittent   Aggravating Factors  stairs; twisting    Pain Relieving Factors rest            [facility] PT Assessment - 07/15/16 0001      Assessment   Medical Diagnosis Lt knee pain    Referring Provider Dr Dianah Field   Onset Date/Surgical Date 03/29/16   Hand Dominance Right   Next MD Visit 07/26/16   Prior Therapy none     Strength   Overall Strength Comments 5/5 bilat LE's no pain      Flexibility   Hamstrings tight  Rt 90 deg; Lt 90 deg   Quadriceps tight Rt 100 deg; Lt 95 deg heel to buttock in prone    ITB mild tightness bilat    Piriformis mild tightness Lt > Rt     Palpation   Patella mobility WFL's    Palpation comment tender to palpation along the medial joint line and just distal medially Lt knee      Ambulation/Gait   Gait Comments WFL's no noticable limp                      [facility] Adult PT Treatment/Exercise - 07/15/16 0001      Knee/Hip Exercises: Stretches   Passive Hamstring Stretch 3 reps;30 seconds   ITB Stretch 3 reps;30 seconds   Other Knee/Hip Stretches HS adductor stretch 30 sec x 2 Lt      Knee/Hip Exercises: Aerobic   Stationary Bike Nustep L4 x 7 min      Knee/Hip Exercises: Standing   Heel Raises Both;10 reps;1 second;2 seconds   Forward Step Up Left;10 reps  1 " book - pt will gradually increase height of step  SLS Lt LE for 10-30 sec x 4 trials - pt reported pain in the Lt knee with longer trials of SLS      Knee/Hip Exercises: Seated   Sit to Sand 10 reps     Knee/Hip Exercises: Supine   Quad Sets Left;1 set;10 reps  small flat pillow under knee    Hip Adduction Isometric Strengthening;Both;10 reps  ball squeeze    Bridges Strengthening;Both;10 reps  3-5 sec hold    Bridges with Actuary with Clamshell Strengthening;Right;Left;10 reps  red TB   Straight Leg Raise with External Rotation Strengthening;Left;10 reps  3-5 sec hold - ~12 in lift    Other Supine Knee/Hip Exercises clam w/red tb    Other Supine Knee/Hip Exercises hip abduction in sidelying x 10; hip abduction tapping forward/back x 10      Ankle Exercises: Stretches   Soleus Stretch 2 reps;30 seconds   Gastroc Stretch 2 reps;30 seconds                PT Education - 07/15/16 0925    Education provided Yes   Education Details HEP   Person(s) Educated Patient   Methods Explanation;Demonstration;Tactile cues;Verbal cues;Handout   Comprehension  Verbalized understanding;Returned demonstration;Verbal cues required;Tactile cues required             PT Long Term Goals - 07/15/16 0904      PT LONG TERM GOAL #1   Title Decrease pain and tenderness to palpation through the Lt medial knee area 08/15/16   Time 6   Period Weeks   Status On-going     PT LONG TERM GOAL #2   Title Patient reports increased standing and walking tolerance to 30-60 min 08/15/16   Time 6   Period Weeks   Status On-going     PT LONG TERM GOAL #3   Title Ascend and descend steps with one railing with min to no pain 08/15/16   Time 6   Period Weeks   Status On-going     PT LONG TERM GOAL #4   Title Independent in HEP 08/15/16   Time 6   Period Weeks   Status On-going     PT LONG TERM GOAL #5   Title Improve FOTO to </=39% limitation 08/15/16   Time 6   Period Weeks   Status On-going               Plan - 07/15/16 0931    Clinical Impression Statement continued symptoms with some days better than others. She is frustrated with the slow progress and wants to hold PT until she sees Dr Darene Lamer. Terrence Dupont and ROM are WFL's with testing. Goals are partially accomplished.    Rehab Potential Good   PT Frequency 2x / week   PT Duration 6 weeks   PT Treatment/Interventions Patient/family education;ADLs/Self Care Home Management;Cryotherapy;Electrical Stimulation;Iontophoresis 71m/ml Dexamethasone;Ultrasound;Moist Heat;Manual techniques;Dry needling;Therapeutic activities;Therapeutic exercise   PT Next Visit Plan hold PT pending MD visit next week - patient encouraged to continue with HEP    Consulted and Agree with Plan of Care Patient      Patient will benefit from skilled therapeutic intervention in order to improve the following deficits and impairments:  Improper body mechanics, Pain, Decreased activity tolerance, Abnormal gait  Visit Diagnosis: Left knee pain  Other symptoms and signs involving the musculoskeletal system     Problem  List Patient Active Problem List   Diagnosis Date Noted  . Left knee pain 06/10/2016  .  Primary osteoarthritis of left knee 06/10/2016  . Laceration 06/10/2016  . Elevated LDL cholesterol level 06/10/2016    Celyn Nilda Simmer PT, MPH  07/15/2016, 9:38 AM  [facility] [place], [place], [address] Phone: [phone]   Fax:  [phone]  Name: Donna [name] MRN: [id number] Date of Birth: 13-Feb-1951  PHYSICAL THERAPY DISCHARGE SUMMARY  Visits from Start of Care: 3  Current functional level related to goals / functional outcomes: See last progress note for discharge status. Current status unknown   Remaining deficits: unknown   Education / Equipment: HEP Plan: Patient agrees to discharge.  Patient goals were not met. Patient is being discharged due to not returning since the last visit.  ?????    Celyn P. Helene Kelp PT, MPH 10/28/16 9:39 AM

## 2016-07-26 ENCOUNTER — Encounter: Payer: Self-pay | Admitting: Sports Medicine

## 2016-07-26 ENCOUNTER — Ambulatory Visit (INDEPENDENT_AMBULATORY_CARE_PROVIDER_SITE_OTHER): Payer: Medicare Other | Admitting: Sports Medicine

## 2016-07-26 DIAGNOSIS — Z6832 Body mass index (BMI) 32.0-32.9, adult: Secondary | ICD-10-CM

## 2016-07-26 DIAGNOSIS — E669 Obesity, unspecified: Secondary | ICD-10-CM

## 2016-07-26 DIAGNOSIS — E6609 Other obesity due to excess calories: Secondary | ICD-10-CM | POA: Insufficient documentation

## 2016-07-26 DIAGNOSIS — E66811 Obesity, class 1: Secondary | ICD-10-CM | POA: Insufficient documentation

## 2016-07-26 DIAGNOSIS — M1712 Unilateral primary osteoarthritis, left knee: Secondary | ICD-10-CM | POA: Diagnosis not present

## 2016-07-26 MED ORDER — PHENTERMINE HCL 37.5 MG PO TABS: ORAL_TABLET | ORAL | 0 refills | Status: DC

## 2016-07-26 MED ORDER — MELOXICAM 15 MG PO TABS
15.0000 mg | ORAL_TABLET | Freq: Every day | ORAL | 3 refills | Status: DC
Start: 2016-07-26 — End: 2017-07-30

## 2016-07-26 NOTE — Assessment & Plan Note (Signed)
Essentially 90% better with conservative measures including orthotics which were planed down today. Continue meloxicam which is highly effective. She is also going to work on weight loss. There is a trip coming up to [place] at the end of October, so I'm happy to inject her before that if needed.

## 2016-07-26 NOTE — Progress Notes (Signed)
  Subjective:    CC: Follow-up  HPI: Left knee osteoarthritis: 90% better after orthotics, meloxicam, physical therapy. She is also agreeable to start weight loss treatment.  Past medical history, Surgical history, Family history not pertinant except as noted below, Social history, Allergies, and medications have been entered into the medical record, reviewed, and no changes needed.   Review of Systems: No fevers, chills, night sweats, weight loss, chest pain, or shortness of breath.   Objective:    General: Well Developed, well nourished, and in no acute distress.  Neuro: Alert and oriented x3, extra-ocular muscles intact, sensation grossly intact.  HEENT: Normocephalic, atraumatic, pupils equal round reactive to light, neck supple, no masses, no lymphadenopathy, thyroid nonpalpable.  Skin: Warm and dry, no rashes. Cardiac: Regular rate and rhythm, no murmurs rubs or gallops, no lower extremity edema.  Respiratory: Clear to auscultation bilaterally. Not using accessory muscles, speaking in full sentences. Left Knee: Normal to inspection with no erythema or effusion or obvious bony abnormalities. Palpation normal with no warmth or joint line tenderness or patellar tenderness or condyle tenderness. ROM normal in flexion and extension and lower leg rotation. Ligaments with solid consistent endpoints including ACL, PCL, LCL, MCL. Negative Mcmurray's and provocative meniscal tests. Non painful patellar compression. Patellar and quadriceps tendons unremarkable. Hamstring and quadriceps strength is normal.  Impression and Recommendations:    Primary osteoarthritis of left knee Essentially 90% better with conservative measures including orthotics which were planed down today. Continue meloxicam which is highly effective. She is also going to work on weight loss. There is a trip coming up to [place] at the end of October, so I'm happy to inject her before that if  needed.  Obesity Starting phentermine, she does have a history of what sounds to be supraventricular tachycardia, she is post-ablation in the distant past. Rarely gets symptoms in the do not put her in the hospital. She will start a half tab phentermine for 1 week before going to a full tab with careful observation of her heart rate. I'm also going to place a referral to nutrition, she will follow this up with her PCP.  I spent 25 minutes with this patient, greater than 50% was face-to-face time counseling regarding the above diagnoses

## 2016-07-26 NOTE — Assessment & Plan Note (Signed)
Starting phentermine, she does have a history of what sounds to be supraventricular tachycardia, she is post-ablation in the distant past. Rarely gets symptoms in the do not put her in the hospital. She will start a half tab phentermine for 1 week before going to a full tab with careful observation of her heart rate. I'm also going to place a referral to nutrition, she will follow this up with her PCP.

## 2016-08-15 DIAGNOSIS — Z23 Encounter for immunization: Secondary | ICD-10-CM | POA: Diagnosis not present

## 2016-08-23 ENCOUNTER — Ambulatory Visit (INDEPENDENT_AMBULATORY_CARE_PROVIDER_SITE_OTHER): Payer: Medicare Other | Admitting: Physician Assistant

## 2016-08-23 ENCOUNTER — Encounter: Payer: Self-pay | Admitting: Physician Assistant

## 2016-08-23 VITALS — BP 141/68 | HR 97 | Ht 70.0 in | Wt 220.0 lb

## 2016-08-23 DIAGNOSIS — R635 Abnormal weight gain: Secondary | ICD-10-CM | POA: Diagnosis not present

## 2016-08-23 DIAGNOSIS — E669 Obesity, unspecified: Secondary | ICD-10-CM

## 2016-08-23 MED ORDER — PHENTERMINE HCL 37.5 MG PO TABS: ORAL_TABLET | ORAL | 0 refills | Status: DC

## 2016-08-23 NOTE — Patient Instructions (Signed)
Grilled/Baked Chicken/Fish, once or twice a week red meat.  Protein need about 200g.  shakeology is a good replacement shake.   Lose it  My Fitness Pal

## 2016-08-25 ENCOUNTER — Encounter: Payer: Self-pay | Admitting: Physician Assistant

## 2016-08-25 NOTE — Progress Notes (Signed)
   Subjective:    Patient ID: Donna [name], female    DOB: 1951/09/07, 65 y.o.   MRN: [id number]  HPI Pt presents to the clinic to follow up on weight loss. Dr. Darene Lamer started  phentermine. She is having a lot of heart palpitations and feeling more anxious. She would like to cut back on dose. She is exercising regularly. She has lost 10lbs in 1 month.    Review of Systems  All other systems reviewed and are negative.  See HPI.     Objective:   Physical Exam  Constitutional: She is oriented to person, place, and time. She appears well-nourished.  HENT:  Head: Normocephalic and atraumatic.  Cardiovascular: Normal rate, regular rhythm and normal heart sounds.   Pulmonary/Chest: Effort normal and breath sounds normal.  Neurological: She is alert and oriented to person, place, and time.  Psychiatric: She has a normal mood and affect. Her behavior is normal.          Assessment & Plan:  Abnormal weight gain/obesity- discussion about weight loss and diet. Encouraged Mediterranean  diet and printed out information. Encouraged use of myfitness pal and/or lose it. Encouraged 1500 calorie diet. Encouraged at least 150 minutes of exercise a week. Decreased phentermine to 1/2 tablet due to palpitations.   Spent 30 minutes and greater than 50 percent of visit spent with diet education and exercise.   Follow up in 1 month for vital and symptom recheck.

## 2016-10-01 IMAGING — DX DG FOOT COMPLETE 3+V*L*
3 series · 3 of 3 positions shown · non-contrast
Comparison: None.

CLINICAL DATA: Left foot injury yesterday, lateral pain with
swelling.

EXAM:
LEFT FOOT - COMPLETE 3+ VIEW

[foot ap]
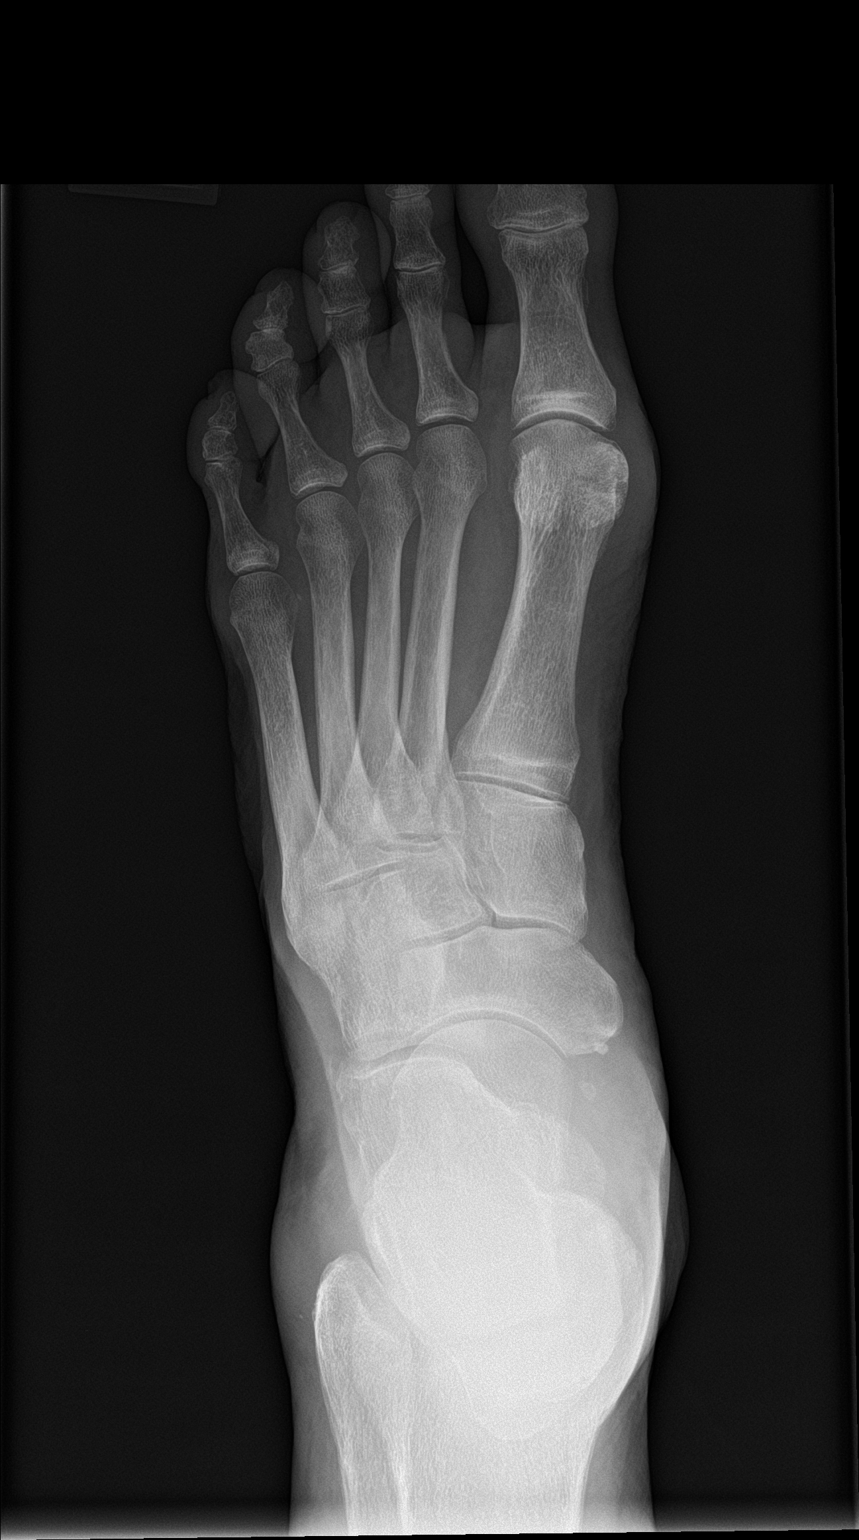

[foot obl]
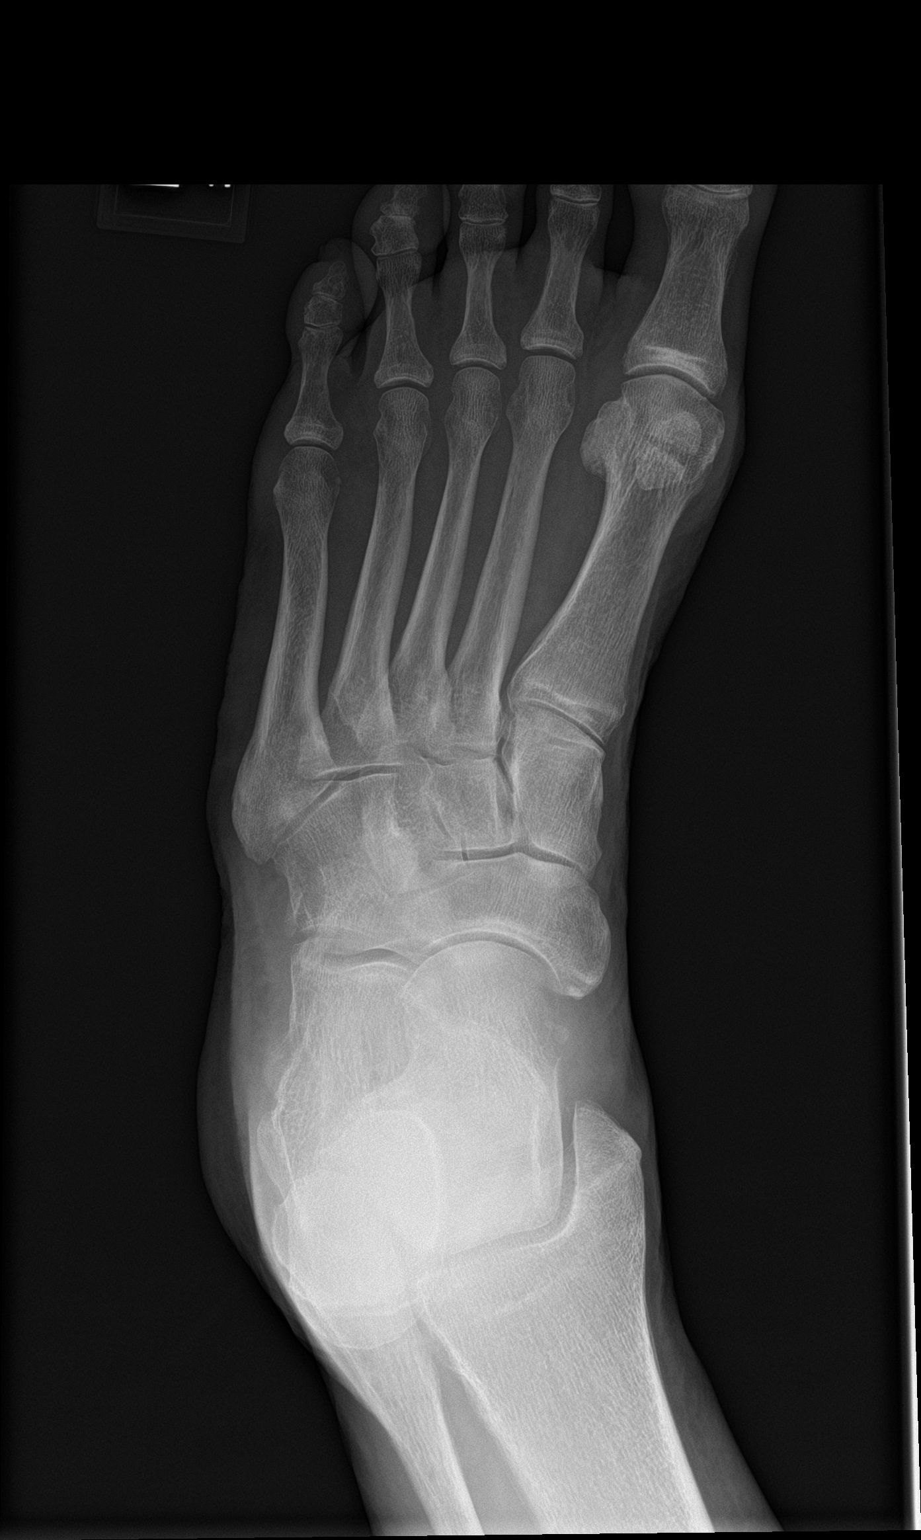

[foot lat]
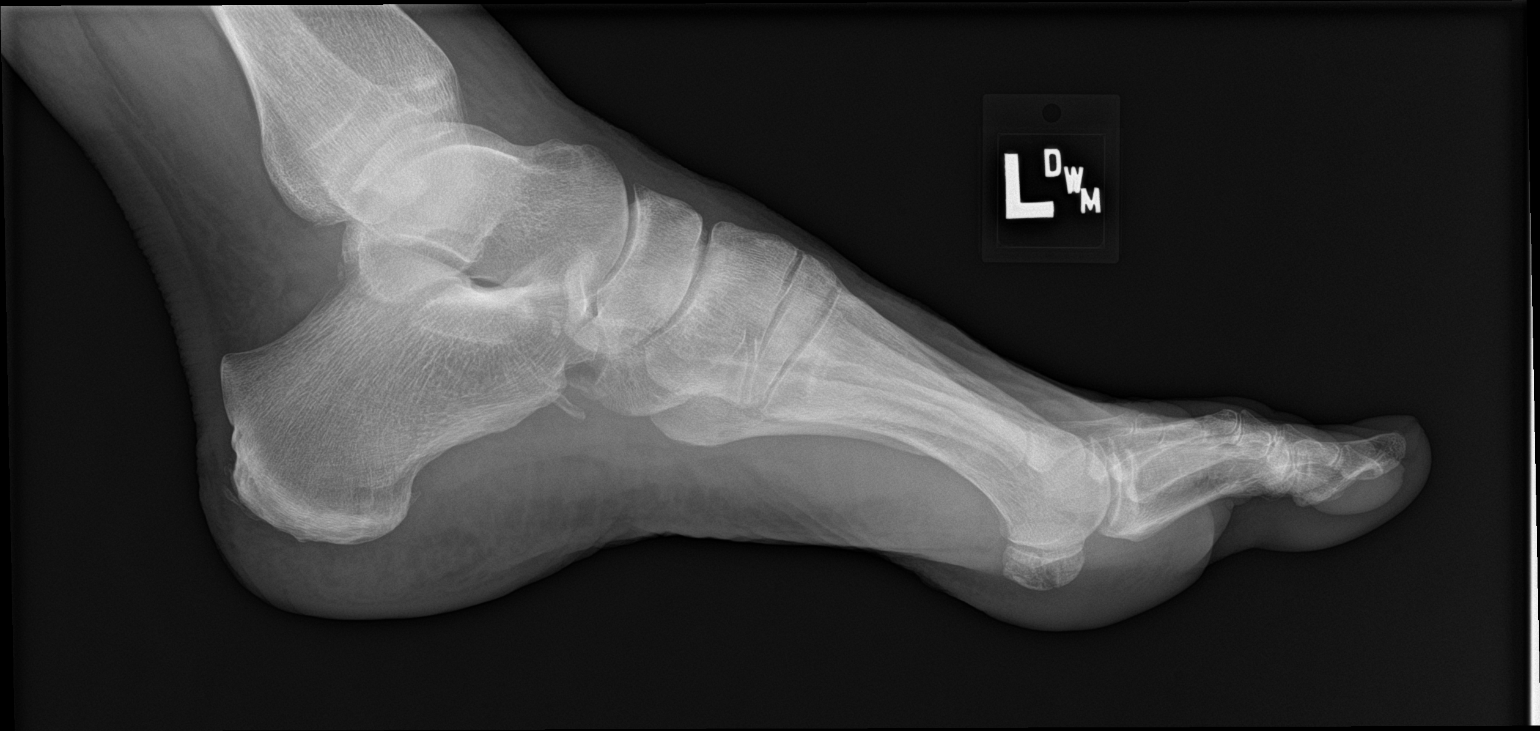

[3 of 3 positions shown; findings below may reference images not displayed]

FINDINGS: Osseous alignment is normal. Perhaps mild diffuse osteopenia, bone
mineralization otherwise normal. No fracture line or displaced
fracture fragment seen. No significant degenerative change. Soft
tissues about the left foot are unremarkable.
IMPRESSION: Negative.

## 2016-10-23 ENCOUNTER — Ambulatory Visit (INDEPENDENT_AMBULATORY_CARE_PROVIDER_SITE_OTHER): Payer: Medicare Other | Admitting: Physician Assistant

## 2016-10-23 ENCOUNTER — Encounter: Payer: Self-pay | Admitting: Physician Assistant

## 2016-10-23 VITALS — BP 148/82 | HR 81 | Ht 70.0 in | Wt 213.0 lb

## 2016-10-23 DIAGNOSIS — E6609 Other obesity due to excess calories: Secondary | ICD-10-CM | POA: Diagnosis not present

## 2016-10-23 DIAGNOSIS — Z6833 Body mass index (BMI) 33.0-33.9, adult: Secondary | ICD-10-CM | POA: Diagnosis not present

## 2016-10-23 MED ORDER — PHENTERMINE HCL 37.5 MG PO TABS: ORAL_TABLET | ORAL | 0 refills | Status: DC

## 2016-10-23 NOTE — Progress Notes (Signed)
   Subjective:    Patient ID: Donna [name], female    DOB: 02/13/1951, 65 y.o.   MRN: [id number]  HPI Pt is a 65 yo female who presents to the clinic to follow up on weight loss. She has lost 7lbs in 2 months taking 1/2 tablet for phentermine. She denies any palpitations, headaches, CP. She is doing a combination of mediterranean diet and atkins diet. She is exercising at least 3 times a week on stationary bike and treadmill.    Review of Systems  All other systems reviewed and are negative.      Objective:   Physical Exam  Constitutional: She is oriented to person, place, and time. She appears well-developed and well-nourished.  HENT:  Head: Normocephalic and atraumatic.  Cardiovascular: Normal rate, regular rhythm and normal heart sounds.   Pulmonary/Chest: Effort normal and breath sounds normal.  Neurological: She is alert and oriented to person, place, and time.  Psychiatric: She has a normal mood and affect. Her behavior is normal.          Assessment & Plan:  Marland KitchenMarland KitchenKamea was seen today for abnormal weight gain.  Diagnoses and all orders for this visit:  Class 1 obesity due to excess calories without serious comorbidity with body mass index (BMI) of 33.0 to 33.9 in adult -     phentermine (ADIPEX-P) 37.5 MG tablet; One tablet daily.   Refilled for another month.  Continue diet and exercise.  Follow up in 2 months.

## 2016-11-22 ENCOUNTER — Ambulatory Visit (INDEPENDENT_AMBULATORY_CARE_PROVIDER_SITE_OTHER): Payer: Medicare Other | Admitting: Physician Assistant

## 2016-11-22 ENCOUNTER — Encounter: Payer: Self-pay | Admitting: Physician Assistant

## 2016-11-22 VITALS — BP 127/74 | HR 118 | Ht 70.0 in | Wt 207.0 lb

## 2016-11-22 DIAGNOSIS — N764 Abscess of vulva: Secondary | ICD-10-CM | POA: Diagnosis not present

## 2016-11-22 MED ORDER — DOXYCYCLINE HYCLATE 100 MG PO TABS: 100.0000 mg | ORAL_TABLET | Freq: Two times a day (BID) | ORAL | 0 refills | Status: DC

## 2016-11-22 NOTE — Patient Instructions (Signed)

## 2016-11-22 NOTE — Progress Notes (Signed)
   Subjective:    Patient ID: Donna [name], female    DOB: 07-08-1951, 65 y.o.   MRN: [id number]  HPI Pt is a 65 yo female who presents to the clinic with a painful bump on left lower labium. Symptoms have been present for 3 days. She denies any fever, chills, nausea, abdominal pain, vomiting. She has not tried anything to make better. Feels like it might be increasing in size.    Review of Systems  All other systems reviewed and are negative.      Objective:   Physical Exam  Constitutional: She appears well-developed and well-nourished.  HENT:  Head: Normocephalic and atraumatic.  Cardiovascular: Normal rate, regular rhythm and normal heart sounds.   Pulmonary/Chest: Effort normal and breath sounds normal.  Genitourinary:             Assessment & Plan:  Marland KitchenMarland KitchenDiagnoses and all orders for this visit:  Boil, labium -     doxycycline (VIBRA-TABS) 100 MG tablet; Take 1 tablet (100 mg total) by mouth 2 (two) times daily. For 10 days.   Discussed symptomatic care with ibuprofen, warm compresses. Possible to be some bartholin's involvement. If no improvement need to drain.

## 2017-01-24 ENCOUNTER — Ambulatory Visit: Payer: Medicare Other | Admitting: Physician Assistant

## 2017-01-27 ENCOUNTER — Ambulatory Visit (INDEPENDENT_AMBULATORY_CARE_PROVIDER_SITE_OTHER): Payer: Medicare Other | Admitting: Physician Assistant

## 2017-01-27 ENCOUNTER — Encounter: Payer: Self-pay | Admitting: Physician Assistant

## 2017-01-27 VITALS — BP 123/72 | HR 89 | Ht 70.0 in | Wt 197.0 lb

## 2017-01-27 DIAGNOSIS — G8929 Other chronic pain: Secondary | ICD-10-CM

## 2017-01-27 DIAGNOSIS — M25562 Pain in left knee: Secondary | ICD-10-CM

## 2017-01-27 DIAGNOSIS — M25561 Pain in right knee: Secondary | ICD-10-CM | POA: Diagnosis not present

## 2017-01-27 DIAGNOSIS — R252 Cramp and spasm: Secondary | ICD-10-CM

## 2017-01-27 DIAGNOSIS — M1712 Unilateral primary osteoarthritis, left knee: Secondary | ICD-10-CM | POA: Diagnosis not present

## 2017-01-27 DIAGNOSIS — E663 Overweight: Secondary | ICD-10-CM | POA: Diagnosis not present

## 2017-01-27 NOTE — Progress Notes (Signed)
   Subjective:    Patient ID: Donna [name], female    DOB: 04-12-1951, 66 y.o.   MRN: [id number]  HPI  Pt is a 66 yo female who presents to the clinic for weight follow up.   She started out as 213 and down to 197. She has lost the last 10lbs without phentermine. 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 BMI is 28 now. She is trying to keep same diet but she notice as soon as she got back that her cravings started again. She is having trouble controlling her diet and so she started back with 1/2 tablet of phentermine.    While she was out of the country she had some bilateral knee pain with left worse than right. At times her knee was weak and felt like it was going to give out but has not. She was doing a lot of walking 1-3KM a day. She also complained of muscle cramps at night only while in [place]. It was really hot and admits to not drinking water like she should.         Review of Systems See HPI.     Objective:   Physical Exam  Constitutional: She is oriented to person, place, and time. She appears well-developed and well-nourished.  HENT:  Head: Normocephalic and atraumatic.  Neck: Normal range of motion. Neck supple.  Cardiovascular: Normal rate, regular rhythm and normal heart sounds.   Pulmonary/Chest: Effort normal and breath sounds normal.  Musculoskeletal:  Mild creptius bilaterally with left greater than right.  NROM of bilateral knees.  No joint space tenderness.  Negative anterior drawer.  Negative mcmurrays.  No effusion, redness, warmth.   Lymphadenopathy:    She has no cervical adenopathy.  Neurological: She is alert and oriented to person, place, and time.  Psychiatric: She has a normal mood and affect. Her behavior is normal.          Assessment & Plan:  Marland KitchenMarland KitchenKalandra was seen today for obesity.  Diagnoses and all orders for this visit:  Chronic pain of both knees  Muscle cramps  Primary osteoarthritis of left knee  Overweight (BMI  25.0-29.9)   Discussed knee strengthening exercise. Consider knee sleeve for support while strengthen knee. mobic as needed for pain. Follow up with sports medicine for future injections.   Discussed possible causes of muscle cramps. Consider magnesium/alka seltzer for as needed cramps. Stay hydrated. Follow up as needed.   Continue to try to lose weight. She had phentermine left over ok to take. Discussed diet and exercise. She is almost to goal. Suggested 10 more pounds.   Spent 30 minutes with patient and greater than 50 percent of visit spent counseling patient regarding knee pain and weight loss.

## 2017-01-27 NOTE — Patient Instructions (Addendum)
Knee Exercises Ask your health care provider which exercises are safe for you. Do exercises exactly as told by your health care provider and adjust them as directed. It is normal to feel mild stretching, pulling, tightness, or discomfort as you do these exercises, but you should stop right away if you feel sudden pain or your pain gets worse.Do not begin these exercises until told by your health care provider. STRETCHING AND RANGE OF MOTION EXERCISES  These exercises warm up your muscles and joints and improve the movement and flexibility of your knee. These exercises also help to relieve pain, numbness, and tingling. Exercise A: Knee Extension, Prone 1. Lie on your abdomen on a bed. 2. Place your left / right knee just beyond the edge of the surface so your knee is not on the bed. You can put a towel under your left / right thigh just above your knee for comfort. 3. Relax your leg muscles and allow gravity to straighten your knee. You should feel a stretch behind your left / right knee. 4. Hold this position for __________ seconds. 5. Scoot up so your knee is supported between repetitions. Repeat __________ times. Complete this stretch __________ times a day. Exercise B: Knee Flexion, Active  1. Lie on your back with both knees straight. If this causes back discomfort, bend your left / right knee so your foot is flat on the floor. 2. Slowly slide your left / right heel back toward your buttocks until you feel a gentle stretch in the front of your knee or thigh. 3. Hold this position for __________ seconds. 4. Slowly slide your left / right heel back to the starting position. Repeat __________ times. Complete this exercise __________ times a day. Exercise C: Quadriceps, Prone  1. Lie on your abdomen on a firm surface, such as a bed or padded floor. 2. Bend your left / right knee and hold your ankle. If you cannot reach your ankle or pant leg, loop a belt around your foot and grab the belt  instead. 3. Gently pull your heel toward your buttocks. Your knee should not slide out to the side. You should feel a stretch in the front of your thigh and knee. 4. Hold this position for __________ seconds. Repeat __________ times. Complete this stretch __________ times a day. Exercise D: Hamstring, Supine 1. Lie on your back. 2. Loop a belt or towel over the ball of your left / right foot. The ball of your foot is on the walking surface, right under your toes. 3. Straighten your left / right knee and slowly pull on the belt to raise your leg until you feel a gentle stretch behind your knee.  Do not let your left / right knee bend while you do this.  Keep your other leg flat on the floor. 4. Hold this position for __________ seconds. Repeat __________ times. Complete this stretch __________ times a day. STRENGTHENING EXERCISES  These exercises build strength and endurance in your knee. Endurance is the ability to use your muscles for a long time, even after they get tired. Exercise E: Quadriceps, Isometric  1. Lie on your back with your left / right leg extended and your other knee bent. Put a rolled towel or small pillow under your knee if told by your health care provider. 2. Slowly tense the muscles in the front of your left / right thigh. You should see your kneecap slide up toward your hip or see increased dimpling just above the  knee. This motion will push the back of the knee toward the floor. 3. For __________ seconds, keep the muscle as tight as you can without increasing your pain. 4. Relax the muscles slowly and completely. Repeat __________ times. Complete this exercise __________ times a day. Exercise F: Straight Leg Raises - Quadriceps 1. Lie on your back with your left / right leg extended and your other knee bent. 2. Tense the muscles in the front of your left / right thigh. You should see your kneecap slide up or see increased dimpling just above the knee. Your thigh may  even shake a bit. 3. Keep these muscles tight as you raise your leg 4-6 inches (10-15 cm) off the floor. Do not let your knee bend. 4. Hold this position for __________ seconds. 5. Keep these muscles tense as you lower your leg. 6. Relax your muscles slowly and completely after each repetition. Repeat __________ times. Complete this exercise __________ times a day. Exercise G: Hamstring, Isometric 1. Lie on your back on a firm surface. 2. Bend your left / right knee approximately __________ degrees. 3. Dig your left / right heel into the surface as if you are trying to pull it toward your buttocks. Tighten the muscles in the back of your thighs to dig as hard as you can without increasing any pain. 4. Hold this position for __________ seconds. 5. Release the tension gradually and allow your muscles to relax completely for __________ seconds after each repetition. Repeat __________ times. Complete this exercise __________ times a day. Exercise H: Hamstring Curls  If told by your health care provider, do this exercise while wearing ankle weights. Begin with __________ weights. Then increase the weight by 1 lb (0.5 kg) increments. Do not wear ankle weights that are more than __________. 1. Lie on your abdomen with your legs straight. 2. Bend your left / right knee as far as you can without feeling pain. Keep your hips flat against the floor. 3. Hold this position for __________ seconds. 4. Slowly lower your leg to the starting position. Repeat __________ times. Complete this exercise __________ times a day. Exercise I: Squats (Quadriceps) 1. Stand in front of a table, with your feet and knees pointing straight ahead. You may rest your hands on the table for balance but not for support. 2. Slowly bend your knees and lower your hips like you are going to sit in a chair.  Keep your weight over your heels, not over your toes.  Keep your lower legs upright so they are parallel with the table  legs.  Do not let your hips go lower than your knees.  Do not bend lower than told by your health care provider.  If your knee pain increases, do not bend as low. 3. Hold the squat position for __________ seconds. 4. Slowly push with your legs to return to standing. Do not use your hands to pull yourself to standing. Repeat __________ times. Complete this exercise __________ times a day. Exercise J: Wall Slides (Quadriceps)  1. Lean your back against a smooth wall or door while you walk your feet out 18-24 inches (46-61 cm) from it. 2. Place your feet hip-width apart. 3. Slowly slide down the wall or door until your knees bend __________ degrees. Keep your knees over your heels, not over your toes. Keep your knees in line with your hips. 4. Hold for __________ seconds. Repeat __________ times. Complete this exercise __________ times a day. Exercise K: Straight Leg Raises -  Hip Abductors 1. Lie on your side with your left / right leg in the top position. Lie so your head, shoulder, knee, and hip line up. You may bend your bottom knee to help you keep your balance. 2. Roll your hips slightly forward so your hips are stacked directly over each other and your left / right knee is facing forward. 3. Leading with your heel, lift your top leg 4-6 inches (10-15 cm). You should feel the muscles in your outer hip lifting.  Do not let your foot drift forward.  Do not let your knee roll toward the ceiling. 4. Hold this position for __________ seconds. 5. Slowly return your leg to the starting position. 6. Let your muscles relax completely after each repetition. Repeat __________ times. Complete this exercise __________ times a day. Exercise L: Straight Leg Raises - Hip Extensors 1. Lie on your abdomen on a firm surface. You can put a pillow under your hips if that is more comfortable. 2. Tense the muscles in your buttocks and lift your left / right leg about 4-6 inches (10-15 cm). Keep your knee  straight as you lift your leg. 3. Hold this position for __________ seconds. 4. Slowly lower your leg to the starting position. 5. Let your leg relax completely after each repetition. Repeat __________ times. Complete this exercise __________ times a day. This information is not intended to replace advice given to you by your health care provider. Make sure you discuss any questions you have with your health care provider. Document Released: 10/02/2005 Document Revised: 08/12/2016 Document Reviewed: 09/24/2015 Elsevier Interactive Patient Education  2017 [employer] Leg Cramps Introduction Leg cramps occur when a muscle or muscles tighten and you have no control over this tightening (involuntary muscle contraction). Muscle cramps can develop in any muscle, but the most common place is in the calf muscles of the leg. Those cramps can occur during exercise or when you are at rest. Leg cramps are painful, and they may last for a few seconds to a few minutes. Cramps may return several times before they finally stop. Usually, leg cramps are not caused by a serious medical problem. In many cases, the cause is not known. Some common causes include:  Overexertion.  Overuse from repetitive motions, or doing the same thing over and over.  Remaining in a certain position for a long period of time.  Improper preparation, form, or technique while performing a sport or an activity.  Dehydration.  Injury.  Side effects of some medicines.  Abnormally low levels of the salts and ions in your blood (electrolytes), especially potassium and calcium. These levels could be low if you are taking water pills (diuretics) or if you are pregnant. Follow these instructions at home: Watch your condition for any changes. Taking the following actions may help to lessen any discomfort that you are feeling:  Stay well-hydrated. Drink enough fluid to keep your urine clear or pale yellow.  Try massaging,  stretching, and relaxing the affected muscle. Do this for several minutes at a time.  For tight or tense muscles, use a warm towel, heating pad, or hot shower water directed to the affected area.  If you are sore or have pain after a cramp, applying ice to the affected area may relieve discomfort.  Put ice in a plastic bag.  Place a towel between your skin and the bag.  Leave the ice on for 20 minutes, 2-3 times per day.  Avoid strenuous exercise for several days  if you have been having frequent leg cramps.  Make sure that your diet includes the essential minerals for your muscles to work normally.  Take medicines only as directed by your health care provider. Contact a health care provider if:  Your leg cramps get more severe or more frequent, or they do not improve over time.  Your foot becomes cold, numb, or blue. This information is not intended to replace advice given to you by your health care provider. Make sure you discuss any questions you have with your health care provider. Document Released: 12/26/2004 Document Revised: 04/25/2016 Document Reviewed: 10/26/2014  2017 [employer]vier

## 2017-07-23 DIAGNOSIS — H5203 Hypermetropia, bilateral: Secondary | ICD-10-CM | POA: Diagnosis not present

## 2017-07-23 DIAGNOSIS — H524 Presbyopia: Secondary | ICD-10-CM | POA: Diagnosis not present

## 2017-07-23 DIAGNOSIS — H4322 Crystalline deposits in vitreous body, left eye: Secondary | ICD-10-CM | POA: Diagnosis not present

## 2017-07-23 DIAGNOSIS — H52223 Regular astigmatism, bilateral: Secondary | ICD-10-CM | POA: Diagnosis not present

## 2017-07-23 DIAGNOSIS — H31011 Macula scars of posterior pole (postinflammatory) (post-traumatic), right eye: Secondary | ICD-10-CM | POA: Diagnosis not present

## 2017-07-23 DIAGNOSIS — H2513 Age-related nuclear cataract, bilateral: Secondary | ICD-10-CM | POA: Diagnosis not present

## 2017-07-30 ENCOUNTER — Other Ambulatory Visit: Payer: Self-pay

## 2017-07-30 DIAGNOSIS — M1712 Unilateral primary osteoarthritis, left knee: Secondary | ICD-10-CM

## 2017-07-30 MED ORDER — MELOXICAM 15 MG PO TABS: 15.0000 mg | ORAL_TABLET | Freq: Every day | ORAL | 3 refills | Status: DC

## 2017-07-31 ENCOUNTER — Telehealth: Payer: Self-pay | Admitting: Sports Medicine

## 2017-07-31 NOTE — Telephone Encounter (Signed)
Pt left VM requesting refill on her meloxicam. Pt questions if she needs an appointment, or if a refill can be sent. Requesting a year supply.

## 2017-07-31 NOTE — Telephone Encounter (Signed)
I gave her 80 with 3 refills yesterday.

## 2017-08-01 NOTE — Telephone Encounter (Signed)
Pt advised,verbalized understanding. 

## 2017-08-06 DIAGNOSIS — L570 Actinic keratosis: Secondary | ICD-10-CM | POA: Diagnosis not present

## 2017-08-06 DIAGNOSIS — L218 Other seborrheic dermatitis: Secondary | ICD-10-CM | POA: Diagnosis not present

## 2017-09-25 DIAGNOSIS — Z23 Encounter for immunization: Secondary | ICD-10-CM | POA: Diagnosis not present

## 2017-11-03 ENCOUNTER — Telehealth: Payer: Self-pay | Admitting: *Deleted

## 2017-11-03 NOTE — Telephone Encounter (Signed)
Good for 2 years!

## 2017-11-03 NOTE — Telephone Encounter (Signed)
Pt left vm stating that she is going back to [place] in about 4 weeks and wanted to know if she needed another typhoid vaccine & if so, when?  Please advise.

## 2017-11-04 NOTE — Telephone Encounter (Signed)
Tried calling pt back; no answer or vm.

## 2017-11-06 ENCOUNTER — Other Ambulatory Visit: Payer: Self-pay

## 2017-11-13 DIAGNOSIS — Z1231 Encounter for screening mammogram for malignant neoplasm of breast: Secondary | ICD-10-CM | POA: Diagnosis not present

## 2017-11-13 LAB — HM MAMMOGRAPHY

## 2017-12-12 ENCOUNTER — Encounter: Payer: Self-pay | Admitting: Physician Assistant

## 2018-03-25 DIAGNOSIS — L820 Inflamed seborrheic keratosis: Secondary | ICD-10-CM | POA: Diagnosis not present

## 2018-03-25 DIAGNOSIS — L821 Other seborrheic keratosis: Secondary | ICD-10-CM | POA: Diagnosis not present

## 2018-04-30 DIAGNOSIS — Z0100 Encounter for examination of eyes and vision without abnormal findings: Secondary | ICD-10-CM | POA: Diagnosis not present

## 2018-08-11 ENCOUNTER — Ambulatory Visit (INDEPENDENT_AMBULATORY_CARE_PROVIDER_SITE_OTHER): Payer: Medicare HMO | Admitting: Physician Assistant

## 2018-08-11 ENCOUNTER — Encounter: Payer: Self-pay | Admitting: Physician Assistant

## 2018-08-11 ENCOUNTER — Ambulatory Visit (INDEPENDENT_AMBULATORY_CARE_PROVIDER_SITE_OTHER): Payer: Medicare HMO

## 2018-08-11 VITALS — BP 120/64 | HR 85 | Ht 66.0 in | Wt 229.0 lb

## 2018-08-11 DIAGNOSIS — M25511 Pain in right shoulder: Secondary | ICD-10-CM

## 2018-08-11 MED ORDER — TRAMADOL HCL 50 MG PO TABS: 50.0000 mg | ORAL_TABLET | Freq: Three times a day (TID) | ORAL | 0 refills | Status: DC | PRN

## 2018-08-11 NOTE — Patient Instructions (Signed)
Rotator Cuff Tendinitis Rotator cuff tendinitis is inflammation of the tough, cord-like bands that connect muscle to bone (tendons) in the rotator cuff. The rotator cuff includes all of the muscles and tendons that connect the arm to the shoulder. The rotator cuff holds the head of the upper arm bone (humerus) in the cup (fossa) of the shoulder blade (scapula). This condition can lead to a long-lasting (chronic) tear. The tear may be partial or complete. What are the causes? This condition is usually caused by overusing the rotator cuff. What increases the risk? This condition is more likely to develop in athletes and workers who frequently use their shoulder or reach over their heads. This can include activities such as:  Tennis.  Baseball or softball.  Swimming.  Construction work.  Painting.  What are the signs or symptoms? Symptoms of this condition include:  Pain spreading (radiating) from the shoulder to the upper arm.  Swelling and tenderness in front of the shoulder.  Pain when reaching, pulling, or lifting the arm above the head.  Pain when lowering the arm from above the head.  Minor pain in the shoulder when resting.  Increased pain in the shoulder at night.  Difficulty placing the arm behind the back.  How is this diagnosed? This condition is diagnosed with a medical history and physical exam. Tests may also be done, including:  X-rays.  MRI.  Ultrasounds.  CT or MR arthrogram. During this test, a contrast material is injected and then images are taken.  How is this treated? Treatment for this condition depends on the severity of the condition. In less severe cases, treatment may include:  Rest. This may be done with a sling that holds the shoulder still (immobilization). Your health care provider may also recommend avoiding activities that involve lifting your arm over your head.  Icing the shoulder.  Anti-inflammatory medicines, such as aspirin or  ibuprofen.  In more severe cases, treatment may include:  Physical therapy.  Steroid injections.  Surgery.  Follow these instructions at home: If you have a sling:  Wear the sling as told by your health care provider. Remove it only as told by your health care provider.  Loosen the sling if your fingers tingle, become numb, or turn cold and blue.  Keep the sling clean.  If the sling is not waterproof, do not let it get wet. Remove it, if allowed, or cover it with a watertight covering when you take a bath or shower. Managing pain, stiffness, and swelling  If directed, put ice on the injured area. ? If you have a removable sling, remove it as told by your health care provider. ? Put ice in a plastic bag. ? Place a towel between your skin and the bag. ? Leave the ice on for 20 minutes, 2-3 times a day.  Move your fingers often to avoid stiffness and to lessen swelling.  Raise (elevate) the injured area above the level of your heart while you are lying down.  Find a comfortable sleeping position or sleep on a recliner, if available. Driving  Do not drive or use heavy machinery while taking prescription pain medicine.  Ask your health care provider when it is safe to drive if you have a sling on your arm. Activity  Rest your shoulder as told by your health care provider.  Return to your normal activities as told by your health care provider. Ask your health care provider what activities are safe for you.  Do any   exercises or stretches as told by your health care provider.  If you do repetitive overhead tasks, take small breaks in between and include stretching exercises as told by your health care provider. General instructions  Do not use any products that contain nicotine or tobacco, such as cigarettes and e-cigarettes. These can delay healing. If you need help quitting, ask your health care provider.  Take over-the-counter and prescription medicines only as told by  your health care provider.  Keep all follow-up visits as told by your health care provider. This is important. Contact a health care provider if:  Your pain gets worse.  You have new pain in your arm, hands, or fingers.  Your pain is not relieved with medicine or does not get better after 6 weeks of treatment.  You have cracking sensations when moving your shoulder in certain directions.  You hear a snapping sound after using your shoulder, followed by severe pain and weakness. Get help right away if:  Your arm, hand, or fingers are numb or tingling.  Your arm, hand, or fingers are swollen or painful or they turn white or blue. Summary  Rotator cuff tendinitis is inflammation of the tough, cord-like bands that connect muscle to bone (tendons) in the rotator cuff.  This condition is usually caused by overusing the rotator cuff, which includes all of the muscles and tendons that connect the arm to the shoulder.  This condition is more likely to develop in athletes and workers who frequently use their shoulder or reach over their heads.  Treatment generally includes rest, anti-inflammatory medicines, and icing. In some cases, physical therapy and steroid injections may be needed. In severe cases, surgery may be needed. This information is not intended to replace advice given to you by your health care provider. Make sure you discuss any questions you have with your health care provider. Document Released: 02/08/2004 Document Revised: 11/04/2016 Document Reviewed: 11/04/2016 Elsevier Interactive Patient Education  2017 [employer]  

## 2018-08-11 NOTE — Progress Notes (Signed)
   Subjective:    Patient ID: Donna [name], female    DOB: 06/28/1951, 67 y.o.   MRN: [id number]  HPI  Pt is a 67 yo female who presents to the clinic with right shoulder pain for 5 or more months. progressively her pain has gotten worse and worse. 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 She denies any trauma or injury. She does use her shoulder a lot carrying luggage and moving things. She was told by the clinic in [place] that it was bursitis. She was given exercises and told to take tylenol. She has had no relief from exercises. 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 She had mobic for her knee and started using that which has helped some.   .. Active Ambulatory Problems    Diagnosis Date Noted  . Primary osteoarthritis of left knee 06/10/2016  . Laceration 06/10/2016  . Elevated LDL cholesterol level 06/10/2016  . Obesity 07/26/2016  . Boil, labium 11/22/2016  . Overweight (BMI 25.0-29.9) 01/27/2017   Resolved Ambulatory Problems    Diagnosis Date Noted  . Left knee pain 06/10/2016   Past Medical History:  Diagnosis Date  . Rosacea   . Tachycardia      Review of Systems See HPI.     Objective:   Physical Exam  Constitutional: She is oriented to person, place, and time. She appears well-developed and well-nourished.  HENT:  Head: Normocephalic and atraumatic.  Cardiovascular: Normal rate and regular rhythm.  Pulmonary/Chest: Effort normal and breath sounds normal.  Musculoskeletal:  Right shoulder: No tenderness to palpation over clavicle, anterior or posterior shoulder to palpation.  Tenderness over AC joint space and deltoid.  ROM is limited due to pain. Abduction 100 degrees with difficultly.  Positive Hawkins.  Negative drop arm.  Positive empty can.   Neurological: She is alert and oriented to person, place, and time.  Psychiatric: She has a normal mood and affect. Her behavior  is normal.          Assessment & Plan:  Marland KitchenMarland KitchenViolett was seen today for shoulder pain.  Diagnoses and all orders for this visit:  Acute pain of right shoulder -     DG Shoulder Right -     traMADol (ULTRAM) 50 MG tablet; Take 1 tablet (50 mg total) by mouth every 8 (eight) hours as needed.   On PE suspect some rotator cuff etiology/impingement syndrome. Start home exercises, continue mobic daily, will get xray. Tramadol given for more severe pain. [place] controlled substance database reviewed with no concerns.   xray did show spurring and degenerative changes of AC joint. Suspect that is what is causing impingement. Encouraged follow up with sports medicine for injections.

## 2018-08-12 ENCOUNTER — Encounter: Payer: Self-pay | Admitting: Physician Assistant

## 2018-08-12 NOTE — Progress Notes (Signed)
Call pt: you do have some arthritis with narrowing and spurring at the top of your shoulder when I believe is causing some impingement on your rotator cuff and into your deltoid. Keep doing rotator cuff exercises that I gave you along with mobic daily. I do think you would benefit from a shoulder injection. Schedule with sports medicine(here in office dr. Darene Lamer or dr. Georgina Snell) they will likely do a combination approach to hit all areas of pain.

## 2018-08-14 NOTE — Progress Notes (Signed)
Would she like a topical anti-inflammatory cream to try?

## 2018-08-21 ENCOUNTER — Ambulatory Visit (INDEPENDENT_AMBULATORY_CARE_PROVIDER_SITE_OTHER): Payer: Medicare HMO | Admitting: Physician Assistant

## 2018-08-21 ENCOUNTER — Encounter: Payer: Self-pay | Admitting: Physician Assistant

## 2018-08-21 VITALS — BP 118/49 | HR 80 | Ht 69.5 in | Wt 229.0 lb

## 2018-08-21 DIAGNOSIS — Z13220 Encounter for screening for lipoid disorders: Secondary | ICD-10-CM | POA: Diagnosis not present

## 2018-08-21 DIAGNOSIS — Z6832 Body mass index (BMI) 32.0-32.9, adult: Secondary | ICD-10-CM

## 2018-08-21 DIAGNOSIS — Z13820 Encounter for screening for osteoporosis: Secondary | ICD-10-CM | POA: Diagnosis not present

## 2018-08-21 DIAGNOSIS — H9193 Unspecified hearing loss, bilateral: Secondary | ICD-10-CM | POA: Diagnosis not present

## 2018-08-21 DIAGNOSIS — E6609 Other obesity due to excess calories: Secondary | ICD-10-CM

## 2018-08-21 DIAGNOSIS — M25511 Pain in right shoulder: Secondary | ICD-10-CM

## 2018-08-21 DIAGNOSIS — Z0000 Encounter for general adult medical examination without abnormal findings: Secondary | ICD-10-CM

## 2018-08-21 DIAGNOSIS — Z780 Asymptomatic menopausal state: Secondary | ICD-10-CM

## 2018-08-21 DIAGNOSIS — Z131 Encounter for screening for diabetes mellitus: Secondary | ICD-10-CM

## 2018-08-21 NOTE — Patient Instructions (Signed)
Health Maintenance for Postmenopausal Women Menopause is a normal process in which your reproductive ability comes to an end. This process happens gradually over a span of months to years, usually between the ages of 22 and 9. Menopause is complete when you have missed 12 consecutive menstrual periods. It is important to talk with your health care provider about some of the most common conditions that affect postmenopausal women, such as heart disease, cancer, and bone loss (osteoporosis). Adopting a healthy lifestyle and getting preventive care can help to promote your health and wellness. Those actions can also lower your chances of developing some of these common conditions. What should I know about menopause? During menopause, you may experience a number of symptoms, such as:  Moderate-to-severe hot flashes.  Night sweats.  Decrease in sex drive.  Mood swings.  Headaches.  Tiredness.  Irritability.  Memory problems.  Insomnia.  Choosing to treat or not to treat menopausal changes is an individual decision that you make with your health care provider. What should I know about hormone replacement therapy and supplements? Hormone therapy products are effective for treating symptoms that are associated with menopause, such as hot flashes and night sweats. Hormone replacement carries certain risks, especially as you become older. If you are thinking about using estrogen or estrogen with progestin treatments, discuss the benefits and risks with your health care provider. What should I know about heart disease and stroke? Heart disease, heart attack, and stroke become more likely as you age. This may be due, in part, to the hormonal changes that your body experiences during menopause. These can affect how your body processes dietary fats, triglycerides, and cholesterol. Heart attack and stroke are both medical emergencies. There are many things that you can do to help prevent heart disease  and stroke:  Have your blood pressure checked at least every 1-2 years. High blood pressure causes heart disease and increases the risk of stroke.  If you are 53-22 years old, ask your health care provider if you should take aspirin to prevent a heart attack or a stroke.  Do not use any tobacco products, including cigarettes, chewing tobacco, or electronic cigarettes. If you need help quitting, ask your health care provider.  It is important to eat a healthy diet and maintain a healthy weight. ? Be sure to include plenty of vegetables, fruits, low-fat dairy products, and lean protein. ? Avoid eating foods that are high in solid fats, added sugars, or salt (sodium).  Get regular exercise. This is one of the most important things that you can do for your health. ? Try to exercise for at least 150 minutes each week. The type of exercise that you do should increase your heart rate and make you sweat. This is known as moderate-intensity exercise. ? Try to do strengthening exercises at least twice each week. Do these in addition to the moderate-intensity exercise.  Know your numbers.Ask your health care provider to check your cholesterol and your blood glucose. Continue to have your blood tested as directed by your health care provider.  What should I know about cancer screening? There are several types of cancer. Take the following steps to reduce your risk and to catch any cancer development as early as possible. Breast Cancer  Practice breast self-awareness. ? This means understanding how your breasts normally appear and feel. ? It also means doing regular breast self-exams. Let your health care provider know about any changes, no matter how small.  If you are 40  or older, have a clinician do a breast exam (clinical breast exam or CBE) every year. Depending on your age, family history, and medical history, it may be recommended that you also have a yearly breast X-ray (mammogram).  If you  have a family history of breast cancer, talk with your health care provider about genetic screening.  If you are at high risk for breast cancer, talk with your health care provider about having an MRI and a mammogram every year.  Breast cancer (BRCA) gene test is recommended for women who have family members with BRCA-related cancers. Results of the assessment will determine the need for genetic counseling and BRCA1 and for BRCA2 testing. BRCA-related cancers include these types: ? Breast. This occurs in males or females. ? Ovarian. ? Tubal. This may also be called fallopian tube cancer. ? Cancer of the abdominal or pelvic lining (peritoneal cancer). ? Prostate. ? Pancreatic.  Cervical, Uterine, and Ovarian Cancer Your health care provider may recommend that you be screened regularly for cancer of the pelvic organs. These include your ovaries, uterus, and vagina. This screening involves a pelvic exam, which includes checking for microscopic changes to the surface of your cervix (Pap test).  For women ages 21-65, health care providers may recommend a pelvic exam and a Pap test every three years. For women ages 79-65, they may recommend the Pap test and pelvic exam, combined with testing for human papilloma virus (HPV), every five years. Some types of HPV increase your risk of cervical cancer. Testing for HPV may also be done on women of any age who have unclear Pap test results.  Other health care providers may not recommend any screening for nonpregnant women who are considered low risk for pelvic cancer and have no symptoms. Ask your health care provider if a screening pelvic exam is right for you.  If you have had past treatment for cervical cancer or a condition that could lead to cancer, you need Pap tests and screening for cancer for at least 20 years after your treatment. If Pap tests have been discontinued for you, your risk factors (such as having a new sexual partner) need to be  reassessed to determine if you should start having screenings again. Some women have medical problems that increase the chance of getting cervical cancer. In these cases, your health care provider may recommend that you have screening and Pap tests more often.  If you have a family history of uterine cancer or ovarian cancer, talk with your health care provider about genetic screening.  If you have vaginal bleeding after reaching menopause, tell your health care provider.  There are currently no reliable tests available to screen for ovarian cancer.  Lung Cancer Lung cancer screening is recommended for adults 69-62 years old who are at high risk for lung cancer because of a history of smoking. A yearly low-dose CT scan of the lungs is recommended if you:  Currently smoke.  Have a history of at least 30 pack-years of smoking and you currently smoke or have quit within the past 15 years. A pack-year is smoking an average of one pack of cigarettes per day for one year.  Yearly screening should:  Continue until it has been 15 years since you quit.  Stop if you develop a health problem that would prevent you from having lung cancer treatment.  Colorectal Cancer  This type of cancer can be detected and can often be prevented.  Routine colorectal cancer screening usually begins at  age 42 and continues through age 45.  If you have risk factors for colon cancer, your health care provider may recommend that you be screened at an earlier age.  If you have a family history of colorectal cancer, talk with your health care provider about genetic screening.  Your health care provider may also recommend using home test kits to check for hidden blood in your stool.  A small camera at the end of a tube can be used to examine your colon directly (sigmoidoscopy or colonoscopy). This is done to check for the earliest forms of colorectal cancer.  Direct examination of the colon should be repeated every  5-10 years until age 71. However, if early forms of precancerous polyps or small growths are found or if you have a family history or genetic risk for colorectal cancer, you may need to be screened more often.  Skin Cancer  Check your skin from head to toe regularly.  Monitor any moles. Be sure to tell your health care provider: ? About any new moles or changes in moles, especially if there is a change in a mole's shape or color. ? If you have a mole that is larger than the size of a pencil eraser.  If any of your family members has a history of skin cancer, especially at a young age, talk with your health care provider about genetic screening.  Always use sunscreen. Apply sunscreen liberally and repeatedly throughout the day.  Whenever you are outside, protect yourself by wearing long sleeves, pants, a wide-brimmed hat, and sunglasses.  What should I know about osteoporosis? Osteoporosis is a condition in which bone destruction happens more quickly than new bone creation. After menopause, you may be at an increased risk for osteoporosis. To help prevent osteoporosis or the bone fractures that can happen because of osteoporosis, the following is recommended:  If you are 46-71 years old, get at least 1,000 mg of calcium and at least 600 mg of vitamin D per day.  If you are older than age 55 but younger than age 65, get at least 1,200 mg of calcium and at least 600 mg of vitamin D per day.  If you are older than age 54, get at least 1,200 mg of calcium and at least 800 mg of vitamin D per day.  Smoking and excessive alcohol intake increase the risk of osteoporosis. Eat foods that are rich in calcium and vitamin D, and do weight-bearing exercises several times each week as directed by your health care provider. What should I know about how menopause affects my mental health? Depression may occur at any age, but it is more common as you become older. Common symptoms of depression  include:  Low or sad mood.  Changes in sleep patterns.  Changes in appetite or eating patterns.  Feeling an overall lack of motivation or enjoyment of activities that you previously enjoyed.  Frequent crying spells.  Talk with your health care provider if you think that you are experiencing depression. What should I know about immunizations? It is important that you get and maintain your immunizations. These include:  Tetanus, diphtheria, and pertussis (Tdap) booster vaccine.  Influenza every year before the flu season begins.  Pneumonia vaccine.  Shingles vaccine.  Your health care provider may also recommend other immunizations. This information is not intended to replace advice given to you by your health care provider. Make sure you discuss any questions you have with your health care provider. Document Released: 01/10/2006  Document Revised: 06/07/2016 Document Reviewed: 08/22/2015 Elsevier Interactive Patient Education  2018 [employer]  

## 2018-08-21 NOTE — Progress Notes (Signed)
Subjective:     Donna [name] is a 67 y.o. female and is here for a comprehensive physical exam. The patient reports problems - see below.    Pt had hearing screening test done. The results were some impairment and to discuss formal testing. Pt has noticed hearing loss.     Social History   Socioeconomic History  . Marital status: Married    Spouse name: Not on file  . Number of children: Not on file  . Years of education: Not on file  . Highest education level: Not on file  Occupational History  . Not on file  Social Needs  . Financial resource strain: Not on file  . Food insecurity:    Worry: Not on file    Inability: Not on file  . Transportation needs:    Medical: Not on file    Non-medical: Not on file  Tobacco Use  . Smoking status: Never Smoker  . Smokeless tobacco: Never Used  Substance and Sexual Activity  . Alcohol use: Yes    Alcohol/week: 0.0 standard drinks    Comment: 0-1 drinks per week  . Drug use: No  . Sexual activity: Not Currently  Lifestyle  . Physical activity:    Days per week: Not on file    Minutes per session: Not on file  . Stress: Not on file  Relationships  . Social connections:    Talks on phone: Not on file    Gets together: Not on file    Attends religious service: Not on file    Active member of club or organization: Not on file    Attends meetings of clubs or organizations: Not on file    Relationship status: Not on file  . Intimate partner violence:    Fear of current or ex partner: Not on file    Emotionally abused: Not on file    Physically abused: Not on file    Forced sexual activity: Not on file  Other Topics Concern  . Not on file  Social History Narrative  . Not on file   Health Maintenance  Topic Date Due  . DEXA SCAN  12/04/2014  . INFLUENZA VACCINE  03/31/2019 (Originally 07/02/2018)  . TETANUS/TDAP  08/12/2019 (Originally 12/04/1969)  . Hepatitis C Screening  08/12/2019 (Originally 1951/09/20)  . PNA vac Low  Risk Adult (2 of 2 - PPSV23) 09/25/2018  . MAMMOGRAM  11/14/2019  . COLONOSCOPY  03/28/2020    The following portions of the patient's history were reviewed and updated as appropriate: allergies, current medications, past family history, past medical history, past social history, past surgical history and problem list.  Review of Systems A comprehensive review of systems was negative.   Objective:    BP (!) 118/49   Pulse 80   Ht 5' 9.5" (1.765 m)   Wt 229 lb (103.9 kg)   BMI 33.33 kg/m  General appearance: alert, cooperative and appears stated age Head: Normocephalic, without obvious abnormality, atraumatic Eyes: conjunctivae/corneas clear. PERRL, EOM's intact. Fundi benign. Ears: normal TM's and external ear canals both ears Nose: Nares normal. Septum midline. Mucosa normal. No drainage or sinus tenderness. Throat: lips, mucosa, and tongue normal; teeth and gums normal Neck: no adenopathy, no carotid bruit, no JVD, supple, symmetrical, trachea midline and thyroid not enlarged, symmetric, no tenderness/mass/nodules Back: symmetric, no curvature. ROM normal. No CVA tenderness. Lungs: clear to auscultation bilaterally Heart: regular rate and rhythm, S1, S2 normal, no murmur, click, rub or gallop Abdomen: soft,  non-tender; bowel sounds normal; no masses,  no organomegaly Extremities: extremities normal, atraumatic, no cyanosis or edema Pulses: 2+ and symmetric Skin: Skin color, texture, turgor normal. No rashes or lesions Lymph nodes: Cervical, supraclavicular, and axillary nodes normal. Neurologic: Alert and oriented X 3, normal strength and tone. Normal symmetric reflexes. Normal coordination and gait    Assessment:    Healthy female exam.      Plan:    Marland KitchenMarland KitchenZoi was seen today for annual exam.  Diagnoses and all orders for this visit:  Routine physical examination -     DG Bone Density -     Lipid Panel w/reflex Direct LDL -     COMPLETE METABOLIC PANEL WITH GFR -      CBC with Differential/Platelet -     Amb ref to Medical Nutrition Therapy-MNT -     TSH  Osteoporosis screening -     DG Bone Density  Post-menopausal -     DG Bone Density  Class 1 obesity due to excess calories without serious comorbidity with body mass index (BMI) of 32.0 to 32.9 in adult -     Amb ref to Medical Nutrition Therapy-MNT -     TSH  Screening for diabetes mellitus -     COMPLETE METABOLIC PANEL WITH GFR  Screening for lipid disorders -     Lipid Panel w/reflex Direct LDL  Bilateral hearing loss, unspecified hearing loss type -     Ambulatory referral to Audiology  Acute pain of right shoulder  .[name] Depression screen [facility] 2/9 08/11/2018 06/10/2016  Decreased Interest 0 0  Down, Depressed, Hopeless 0 0  PHQ - 2 Score 0 0  Altered sleeping 1 -  Tired, decreased energy 1 -  Change in appetite 2 -  Feeling bad or failure about yourself  0 -  Trouble concentrating 0 -  Moving slowly or fidgety/restless 0 -  Suicidal thoughts 0 -  PHQ-9 Score 4 -  Difficult doing work/chores Somewhat difficult -   .[name] Discussed 150 minutes of exercise a week.  Encouraged vitamin D 1000 units and Calcium 1300mg  or 4 servings of dairy a day.  Pt declined flu and tetanus.  Ordered dexa.  Mammogram and colonoscopy up to date Fasting labs ordered. .   Discussed OA of right shoulder. Consider injections with sports medicine.   Will refer to audiology for hearing test.   ..Discussed low carb diet with 1500 calories and 80g of protein.  Exercising at least 150 minutes a week.  [employer] could be a Microbiologist.  Pt request nutrition referral. Aware might not be covered. She wants to price it.     See After Visit Summary for Counseling Recommendations

## 2018-08-23 ENCOUNTER — Encounter: Payer: Self-pay | Admitting: Physician Assistant

## 2018-08-23 DIAGNOSIS — M75101 Unspecified rotator cuff tear or rupture of right shoulder, not specified as traumatic: Secondary | ICD-10-CM | POA: Insufficient documentation

## 2018-08-23 DIAGNOSIS — Z780 Asymptomatic menopausal state: Secondary | ICD-10-CM | POA: Insufficient documentation

## 2018-08-23 DIAGNOSIS — H9193 Unspecified hearing loss, bilateral: Secondary | ICD-10-CM | POA: Insufficient documentation

## 2018-08-28 ENCOUNTER — Telehealth: Payer: Self-pay | Admitting: *Deleted

## 2018-08-28 ENCOUNTER — Ambulatory Visit (INDEPENDENT_AMBULATORY_CARE_PROVIDER_SITE_OTHER): Payer: Medicare HMO | Admitting: Sports Medicine

## 2018-08-28 ENCOUNTER — Encounter: Payer: Self-pay | Admitting: Sports Medicine

## 2018-08-28 DIAGNOSIS — M25511 Pain in right shoulder: Secondary | ICD-10-CM | POA: Diagnosis not present

## 2018-08-28 DIAGNOSIS — M1712 Unilateral primary osteoarthritis, left knee: Secondary | ICD-10-CM

## 2018-08-28 DIAGNOSIS — Z23 Encounter for immunization: Secondary | ICD-10-CM | POA: Diagnosis not present

## 2018-08-28 MED ORDER — MELOXICAM 15 MG PO TABS: 15.0000 mg | ORAL_TABLET | Freq: Every day | ORAL | 3 refills | Status: DC

## 2018-08-28 NOTE — Telephone Encounter (Signed)
Pt stopped by and stated that [name] told her about another Weight loss medication that she could try that would be better that the phentermine and thought that [name] was going to send this to her pharmacy when she was here for her CPE on 08/21/18? I advised pt that I would send PCP a message to let her know and that we can get back to her with this information. She stated that she does NOT want to do the phentermine anymore. Will route to PCP and CMA for follow up.Elouise Munroe, [place]

## 2018-08-28 NOTE — Assessment & Plan Note (Signed)
Subacromial bursitis/impingement syndrome. Injection, rehab exercises given, return to see me in 1 month. Refilling meloxicam.

## 2018-08-28 NOTE — Progress Notes (Signed)
Subjective:    CC: Right shoulder pain  HPI: For months this pleasant 67 year old female has had pain that she localizes over the deltoid in her right shoulder, worse with overhead activities, no radiation past the elbow, waking her from sleep.  She was on what sounds to be a mission in [place] and was advised to do some exercises by a doctor there.  Tells [place] that it made her have somewhat more pain.  Symptoms are moderate, persistent, localized without radiation.  No trauma, no mechanical symptoms.  I reviewed the past medical history, family history, social history, surgical history, and allergies today and no changes were needed.  Please see the problem list section below in epic for further details.  Past Medical History: Past Medical History:  Diagnosis Date  . Rosacea   . Tachycardia    Past Surgical History: Past Surgical History:  Procedure Laterality Date  . dilation curretage    . endometrial tumor    . NOSE SURGERY  1960  . PARTIAL HYSTERECTOMY  1979   removed one tube and one ovary   . TONSILLECTOMY  1957   Social History: Social History   Socioeconomic History  . Marital status: Married    Spouse name: Not on file  . Number of children: Not on file  . Years of education: Not on file  . Highest education level: Not on file  Occupational History  . Not on file  Social Needs  . Financial resource strain: Not on file  . Food insecurity:    Worry: Not on file    Inability: Not on file  . Transportation needs:    Medical: Not on file    Non-medical: Not on file  Tobacco Use  . Smoking status: Never Smoker  . Smokeless tobacco: Never Used  Substance and Sexual Activity  . Alcohol use: Yes    Alcohol/week: 0.0 standard drinks    Comment: 0-1 drinks per week  . Drug use: No  . Sexual activity: Not Currently  Lifestyle  . Physical activity:    Days per week: Not on file    Minutes per session: Not on file  . Stress: Not on file  Relationships  . Social  connections:    Talks on phone: Not on file    Gets together: Not on file    Attends religious service: Not on file    Active member of club or organization: Not on file    Attends meetings of clubs or organizations: Not on file    Relationship status: Not on file  Other Topics Concern  . Not on file  Social History Narrative  . Not on file   Family History: Family History  Problem Relation Age of Onset  . Diabetes Paternal Uncle   . Breast cancer Sister   . Stroke Paternal Grandmother   . Liver disease Father    Allergies: Allergies  Allergen Reactions  . Shellfish Allergy Nausea And Vomiting   Medications: See med rec.  Review of Systems: No fevers, chills, night sweats, weight loss, chest pain, or shortness of breath.   Objective:    General: Well Developed, well nourished, and in no acute distress.  Neuro: Alert and oriented x3, extra-ocular muscles intact, sensation grossly intact.  HEENT: Normocephalic, atraumatic, pupils equal round reactive to light, neck supple, no masses, no lymphadenopathy, thyroid nonpalpable.  Skin: Warm and dry, no rashes. Cardiac: Regular rate and rhythm, no murmurs rubs or gallops, no lower extremity edema.  Respiratory:  Clear to auscultation bilaterally. Not using accessory muscles, speaking in full sentences. Right shoulder: Inspection reveals no abnormalities, atrophy or asymmetry. Palpation is normal with no tenderness over AC joint or bicipital groove. ROM is full in all planes. Rotator cuff strength normal throughout. Positive Neer and Hawkin's tests, empty can. Speeds and Yergason's tests normal. No labral pathology noted with negative Obrien's, negative crank, negative clunk, and good stability. Normal scapular function observed. No painful arc and no drop arm sign. No apprehension sign  Procedure: Real-time Ultrasound Guided Injection of right subacromial bursa Device: GE Logiq E  Verbal informed consent obtained.  Time-out  conducted.  Noted no overlying erythema, induration, or other signs of local infection.  Skin prepped in a sterile fashion.  Local anesthesia: Topical Ethyl chloride.  With sterile technique and under real time ultrasound guidance: Noted heterogeneity of the supraspinatus, using a 25-gauge needle and injected 1 cc kenalog 40, 1 cc lidocaine, 1 cc bupivacaine. Completed without difficulty  Pain immediately resolved suggesting accurate placement of the medication.  Advised to call if fevers/chills, erythema, induration, drainage, or persistent bleeding.  Images permanently stored and available for review in the ultrasound unit.  Impression: Technically successful ultrasound guided injection.  Impression and Recommendations:    Acute pain of right shoulder Subacromial bursitis/impingement syndrome. Injection, rehab exercises given, return to see me in 1 month. Refilling meloxicam. ___________________________________________ Gwen Her. Dianah Field, M.D., ABFM., CAQSM. Primary Care and [place] Instructor of [place] [employer] [facility] of Medicine

## 2018-08-31 DIAGNOSIS — Z131 Encounter for screening for diabetes mellitus: Secondary | ICD-10-CM | POA: Diagnosis not present

## 2018-08-31 DIAGNOSIS — E6609 Other obesity due to excess calories: Secondary | ICD-10-CM | POA: Diagnosis not present

## 2018-08-31 DIAGNOSIS — Z6832 Body mass index (BMI) 32.0-32.9, adult: Secondary | ICD-10-CM | POA: Diagnosis not present

## 2018-08-31 DIAGNOSIS — Z0000 Encounter for general adult medical examination without abnormal findings: Secondary | ICD-10-CM | POA: Diagnosis not present

## 2018-08-31 DIAGNOSIS — Z13220 Encounter for screening for lipoid disorders: Secondary | ICD-10-CM | POA: Diagnosis not present

## 2018-08-31 LAB — CBC WITH DIFFERENTIAL/PLATELET
BASOS ABS: 36 {cells}/uL (ref 0–200)
Basophils Relative: 0.4 %
EOS ABS: 18 {cells}/uL (ref 15–500)
EOS PCT: 0.2 %
HCT: 40.1 % (ref 35.0–45.0)
Hemoglobin: 13.2 g/dL (ref 11.7–15.5)
Lymphs Abs: 2120 cells/uL (ref 850–3900)
MCH: 29.9 pg (ref 27.0–33.0)
MCHC: 32.9 g/dL (ref 32.0–36.0)
MCV: 90.9 fL (ref 80.0–100.0)
MONOS PCT: 7 %
MPV: 10.2 fL (ref 7.5–12.5)
Neutro Abs: 6288 cells/uL (ref 1500–7800)
Neutrophils Relative %: 69.1 %
PLATELETS: 264 10*3/uL (ref 140–400)
RBC: 4.41 10*6/uL (ref 3.80–5.10)
RDW: 13 % (ref 11.0–15.0)
TOTAL LYMPHOCYTE: 23.3 %
WBC mixed population: 637 cells/uL (ref 200–950)
WBC: 9.1 10*3/uL (ref 3.8–10.8)

## 2018-08-31 LAB — LIPID PANEL W/REFLEX DIRECT LDL
CHOL/HDL RATIO: 2.5 (calc) (ref <5.0)
CHOLESTEROL: 185 mg/dL (ref <200)
HDL: 74 mg/dL (ref >50)
LDL Cholesterol (Calc): 92 mg/dL (calc)
Non-HDL Cholesterol (Calc): 111 mg/dL (calc) (ref <130)
TRIGLYCERIDES: 95 mg/dL (ref <150)

## 2018-08-31 LAB — COMPLETE METABOLIC PANEL WITH GFR
AG RATIO: 1.3 (calc) (ref 1.0–2.5)
ALT: 12 U/L (ref 6–29)
AST: 12 U/L (ref 10–35)
Albumin: 3.6 g/dL (ref 3.6–5.1)
Alkaline phosphatase (APISO): 92 U/L (ref 33–130)
BILIRUBIN TOTAL: 0.5 mg/dL (ref 0.2–1.2)
BUN/Creatinine Ratio: 33 (calc) — ABNORMAL HIGH (ref 6–22)
BUN: 31 mg/dL — ABNORMAL HIGH (ref 7–25)
CALCIUM: 8.7 mg/dL (ref 8.6–10.4)
CHLORIDE: 107 mmol/L (ref 98–110)
CO2: 27 mmol/L (ref 20–32)
Creat: 0.95 mg/dL (ref 0.50–0.99)
GFR, EST AFRICAN AMERICAN: 71 mL/min/{1.73_m2} (ref >=60)
GFR, EST NON AFRICAN AMERICAN: 62 mL/min/{1.73_m2} (ref >=60)
GLOBULIN: 2.7 g/dL (ref 1.9–3.7)
Glucose, Bld: 88 mg/dL (ref 65–99)
POTASSIUM: 4.6 mmol/L (ref 3.5–5.3)
SODIUM: 140 mmol/L (ref 135–146)
TOTAL PROTEIN: 6.3 g/dL (ref 6.1–8.1)

## 2018-08-31 LAB — TSH: TSH: 1.25 m[IU]/L (ref 0.40–4.50)

## 2018-08-31 NOTE — Telephone Encounter (Signed)
I thought we were going to do nutrition referral and then you were going to consider options of belviq or saxenda. They can be pricey and was going to call insurance company and see what they cover. Saxenda are injections and belviq are pills. Let me know which one she wants sent to pharmacy.

## 2018-09-01 NOTE — Progress Notes (Signed)
Call pt: cholesterol looks great. No anemia. Thyroid looks great. Kidney function is normal BUN just out of normal range but could be due to dehydration, stress etc. Not worried but will continue to watch. Check chart there was a question about weight loss medication. Did she get her question answered?

## 2018-09-02 ENCOUNTER — Ambulatory Visit (INDEPENDENT_AMBULATORY_CARE_PROVIDER_SITE_OTHER): Payer: Medicare HMO

## 2018-09-02 ENCOUNTER — Encounter: Payer: Self-pay | Admitting: Physician Assistant

## 2018-09-02 DIAGNOSIS — M85852 Other specified disorders of bone density and structure, left thigh: Secondary | ICD-10-CM

## 2018-09-02 DIAGNOSIS — M8580 Other specified disorders of bone density and structure, unspecified site: Secondary | ICD-10-CM | POA: Insufficient documentation

## 2018-09-02 NOTE — Progress Notes (Signed)
Yes I did and I wanted her to check with insurance on coverage first to see which one would be covered. saxenda and belviq. Let me know which one she wants to try and is covered.

## 2018-09-02 NOTE — Progress Notes (Signed)
Call pt: osteopenia low bone mass. Make sure taking at least vitamin D 1000 units and calcium 4 servings of diary or 1300mg  daily. Recheck in 2 years.

## 2018-09-03 NOTE — Telephone Encounter (Signed)
Spoke with patient and she is going to call her insurance company and will let us know which medication they will cover. No further questions or concerns at this time.

## 2018-09-18 ENCOUNTER — Telehealth: Payer: Self-pay

## 2018-09-18 NOTE — Telephone Encounter (Signed)
Donna [name] called today and said she called her insurance company to check and see what weight loss medications they cover. They told her that they will only cover Phentermine. She said she has previously been on Phentermine, and would like to give it another try if it's possible. Just wanted to let you know. Thanks!

## 2018-09-22 MED ORDER — PHENTERMINE HCL 37.5 MG PO TABS: 37.5000 mg | ORAL_TABLET | Freq: Every day | ORAL | 0 refills | Status: DC

## 2018-09-22 NOTE — Telephone Encounter (Signed)
Follow up in one month. rx sent to pharmacy.

## 2018-09-28 DIAGNOSIS — L821 Other seborrheic keratosis: Secondary | ICD-10-CM | POA: Diagnosis not present

## 2018-09-28 DIAGNOSIS — L820 Inflamed seborrheic keratosis: Secondary | ICD-10-CM | POA: Diagnosis not present

## 2018-11-09 ENCOUNTER — Ambulatory Visit (INDEPENDENT_AMBULATORY_CARE_PROVIDER_SITE_OTHER): Payer: Medicare HMO | Admitting: Family Medicine

## 2018-11-09 ENCOUNTER — Encounter: Payer: Self-pay | Admitting: Family Medicine

## 2018-11-09 ENCOUNTER — Ambulatory Visit: Payer: Medicare HMO | Admitting: Physician Assistant

## 2018-11-09 VITALS — BP 131/67 | HR 97 | Ht 69.0 in | Wt 224.0 lb

## 2018-11-09 DIAGNOSIS — Z7184 Encounter for health counseling related to travel: Secondary | ICD-10-CM | POA: Diagnosis not present

## 2018-11-09 DIAGNOSIS — R202 Paresthesia of skin: Secondary | ICD-10-CM | POA: Diagnosis not present

## 2018-11-09 DIAGNOSIS — M25511 Pain in right shoulder: Secondary | ICD-10-CM | POA: Diagnosis not present

## 2018-11-09 DIAGNOSIS — Z23 Encounter for immunization: Secondary | ICD-10-CM | POA: Diagnosis not present

## 2018-11-09 MED ORDER — TYPHOID VACCINE PO CPDR: 1.0000 | DELAYED_RELEASE_CAPSULE | ORAL | 0 refills | Status: DC

## 2018-11-09 MED ORDER — CIPROFLOXACIN HCL 500 MG PO TABS: 500.0000 mg | ORAL_TABLET | Freq: Two times a day (BID) | ORAL | 0 refills | Status: DC

## 2018-11-09 NOTE — Patient Instructions (Addendum)
Thank you for coming in today. We will keep an eye on the leg symptoms.   Return if not improving.   If the shoulder pain is still worsening prior to travel return.   Paresthesia Paresthesia is an abnormal burning or prickling sensation. This sensation is generally felt in the hands, arms, legs, or feet. However, it may occur in any part of the body. Usually, it is not painful. The feeling may be described as:  Tingling or numbness.  Pins and needles.  Skin crawling.  Buzzing.  Limbs falling asleep.  Itching.  Most people experience temporary (transient) paresthesia at some time in their lives. Paresthesia may occur when you breathe too quickly (hyperventilation). It can also occur without any apparent cause. Commonly, paresthesia occurs when pressure is placed on a nerve. The sensation quickly goes away after the pressure is removed. For some people, however, paresthesia is a long-lasting (chronic) condition that is caused by an underlying disorder. If you continue to have paresthesia, you may need further medical evaluation. Follow these instructions at home: Watch your condition for any changes. Taking the following actions may help to lessen any discomfort that you are feeling:  Avoid drinking alcohol.  Try acupuncture or massage to help relieve your symptoms.  Keep all follow-up visits as directed by your health care provider. This is important.  Contact a health care provider if:  You continue to have episodes of paresthesia.  Your burning or prickling feeling gets worse when you walk.  You have pain, cramps, or dizziness.  You develop a rash. Get help right away if:  You feel weak.  You have trouble walking or moving.  You have problems with speech, understanding, or vision.  You feel confused.  You cannot control your bladder or bowel movements.  You have numbness after an injury.  You faint. This information is not intended to replace advice given to  you by your health care provider. Make sure you discuss any questions you have with your health care provider. Document Released: 11/08/2002 Document Revised: 04/25/2016 Document Reviewed: 11/14/2014 Elsevier Interactive Patient Education  [employer].

## 2018-11-09 NOTE — Progress Notes (Signed)
Donna [name] is a 67 y.o. female who presents to [place]: [place] today for bilateral leg numbness.  Patient notes occasional numb tingly sensation mostly at bedtime in her bilateral lower extremities left worse than right.  This is been ongoing for a few weeks now.  She denies any pain weakness or loss of function.  She denies any bowel or bladder dysfunction.  She notes symptoms mostly occur on the medial calves but do not radiate to the dorsal or plantar foot or to the proximal to the knee.  She does not feel a creepy crawly sensation when she is trying to sleep.  Additionally she notes continued pain in her right shoulder.  She was seen by my partner Dr. Dianah Field in late September for this thought to have rotator cuff tendinitis.  She had subacromial bursa injection which worked quite well until recently.  She notes the pain is mild and is slowly returning.  She is traveling to [place] for 5 weeks on January 3.  She thinks she would like a repeat injection prior to travel if it is worsening.  Additionally as noted she is traveling to [place] and would like a prescription for the oral typhoid vaccine as well as backup ciprofloxacin to take if she develops traveler's diarrhea there.  She has has been to [place] multiple times.  ROS as above:  Exam:  BP 131/67   Pulse 97   Ht 5\' 9"  (1.753 m)   Wt 224 lb (101.6 kg)   BMI 33.08 kg/m  Wt Readings from Last 5 Encounters:  11/09/18 224 lb (101.6 kg)  08/21/18 229 lb (103.9 kg)  08/11/18 229 lb (103.9 kg)  01/27/17 197 lb (89.4 kg)  11/22/16 207 lb (93.9 kg)    Gen: Well NAD HEENT: EOMI,  MMM Lungs: Normal work of breathing. CTABL Heart: RRR no MRG Abd: NABS, Soft. Nondistended, Nontender Exts: Brisk capillary refill, warm and well perfused.  MSK: Right shoulder normal-appearing normal motion pain with abduction.  Intact  strength.  Positive Hawkins Neer's and empty can test. Pulses cap refill and sensation are intact distally.  L-spine nontender to midline normal back motion.  Lower extremity strength reflexes and sensation are equal and normal throughout.  Lower legs feet and ankle normal-appearing nontender normal motion intact sensation.  Normal gait.  Lab and Radiology Results EXAM: RIGHT SHOULDER - 2+ VIEW  COMPARISON:  None.  FINDINGS: Degenerative changes in the [facility] joint with joint space narrowing and spurring. Glenohumeral joint is maintained. No acute bony abnormality. Specifically, no fracture, subluxation, or dislocation. Soft tissues are intact.  IMPRESSION: Degenerative changes in the right AC joint.  No acute findings.   Electronically Signed   By: Rolm Baptise M.D.   On: 08/12/2018 08:54  I personally (independently) visualized and performed the interpretation of the images attached in this note.    Assessment and Plan: 67 y.o. female with  Bilateral lower extremity paresthesias.  Unclear etiology.  Distribution is not characteristic of lumbar radicular pain.  Regardless symptoms are quite mild at this time.  Plan for watchful waiting.  Reasonable to treat symptomatically with gabapentin if bad enough however patient notes it is not quite bad enough.  If not improving or worsening would be reasonable to proceed with EMG.  Right shoulder pain: Subacromial bursitis/rotator cuff tendinitis.  Recommend patient recheck late December prior to travel.  If continue to worsening not improving may be benefit repeat injection.  Continue home exercise program.  Travel encounter: Provided Tdap vaccine IM and oral typhoid vaccine. Additionally prescribed as needed ciprofloxacin for use for traveler's diarrhea.   Orders Placed This Encounter  Procedures  . Tdap vaccine greater than or equal to 7yo IM   Meds ordered this encounter  Medications  . typhoid (VIVOTIF) DR capsule     Sig: Take 1 capsule by mouth every other day.    Dispense:  4 capsule    Refill:  0  . ciprofloxacin (CIPRO) 500 MG tablet    Sig: Take 1 tablet (500 mg total) by mouth 2 (two) times daily.    Dispense:  30 tablet    Refill:  0     Historical information moved to improve visibility of documentation.  Past Medical History:  Diagnosis Date  . Rosacea   . Tachycardia    Past Surgical History:  Procedure Laterality Date  . dilation curretage    . endometrial tumor    . NOSE SURGERY  1960  . PARTIAL HYSTERECTOMY  1979   removed one tube and one ovary   . TONSILLECTOMY  1957   Social History   Tobacco Use  . Smoking status: Never Smoker  . Smokeless tobacco: Never Used  Substance Use Topics  . Alcohol use: Yes    Alcohol/week: 0.0 standard drinks    Comment: 0-1 drinks per week   family history includes Breast cancer in her sister; Diabetes in her paternal uncle; Liver disease in her father; Stroke in her paternal grandmother.  Medications: Current Outpatient Medications  Medication Sig Dispense Refill  . meloxicam (MOBIC) 15 MG tablet Take 1 tablet (15 mg total) by mouth daily. 90 tablet 3  . phentermine (ADIPEX-P) 37.5 MG tablet Take 1 tablet (37.5 mg total) by mouth daily before breakfast. 30 tablet 0  . ciprofloxacin (CIPRO) 500 MG tablet Take 1 tablet (500 mg total) by mouth 2 (two) times daily. 30 tablet 0  . typhoid (VIVOTIF) DR capsule Take 1 capsule by mouth every other day. 4 capsule 0   No current facility-administered medications for this visit.    Allergies  Allergen Reactions  . Shellfish Allergy Nausea And Vomiting     Discussed warning signs or symptoms. Please see discharge instructions. Patient expresses understanding.

## 2018-11-26 ENCOUNTER — Ambulatory Visit (INDEPENDENT_AMBULATORY_CARE_PROVIDER_SITE_OTHER): Payer: Medicare HMO | Admitting: Sports Medicine

## 2018-11-26 ENCOUNTER — Encounter: Payer: Self-pay | Admitting: Sports Medicine

## 2018-11-26 DIAGNOSIS — M25511 Pain in right shoulder: Secondary | ICD-10-CM | POA: Diagnosis not present

## 2018-11-26 NOTE — Progress Notes (Signed)
Subjective:    CC: Right shoulder pain  HPI: Donna [name] is a pleasant 67 year old female, she does mission trip to [place].  We did an injection about 3 to 4 months ago, did well, she does have another mission trip coming up and is noticing an increase in pain over her deltoid, moderate, persistent, worse with abduction.  Nothing radicular, no trauma, no constitutional symptoms.  I reviewed the past medical history, family history, social history, surgical history, and allergies today and no changes were needed.  Please see the problem list section below in epic for further details.  Past Medical History: Past Medical History:  Diagnosis Date  . Rosacea   . Tachycardia    Past Surgical History: Past Surgical History:  Procedure Laterality Date  . dilation curretage    . endometrial tumor    . NOSE SURGERY  1960  . PARTIAL HYSTERECTOMY  1979   removed one tube and one ovary   . TONSILLECTOMY  1957   Social History: Social History   Socioeconomic History  . Marital status: Married    Spouse name: Not on file  . Number of children: Not on file  . Years of education: Not on file  . Highest education level: Not on file  Occupational History  . Not on file  Social Needs  . Financial resource strain: Not on file  . Food insecurity:    Worry: Not on file    Inability: Not on file  . Transportation needs:    Medical: Not on file    Non-medical: Not on file  Tobacco Use  . Smoking status: Never Smoker  . Smokeless tobacco: Never Used  Substance and Sexual Activity  . Alcohol use: Yes    Alcohol/week: 0.0 standard drinks    Comment: 0-1 drinks per week  . Drug use: No  . Sexual activity: Not Currently  Lifestyle  . Physical activity:    Days per week: Not on file    Minutes per session: Not on file  . Stress: Not on file  Relationships  . Social connections:    Talks on phone: Not on file    Gets together: Not on file    Attends religious service: Not on file   Active member of club or organization: Not on file    Attends meetings of clubs or organizations: Not on file    Relationship status: Not on file  Other Topics Concern  . Not on file  Social History Narrative  . Not on file   Family History: Family History  Problem Relation Age of Onset  . Diabetes Paternal Uncle   . Breast cancer Sister   . Stroke Paternal Grandmother   . Liver disease Father    Allergies: Allergies  Allergen Reactions  . Shellfish Allergy Nausea And Vomiting   Medications: See med rec.  Review of Systems: No fevers, chills, night sweats, weight loss, chest pain, or shortness of breath.   Objective:    General: Well Developed, well nourished, and in no acute distress.  Neuro: Alert and oriented x3, extra-ocular muscles intact, sensation grossly intact.  HEENT: Normocephalic, atraumatic, pupils equal round reactive to light, neck supple, no masses, no lymphadenopathy, thyroid nonpalpable.  Skin: Warm and dry, no rashes. Cardiac: Regular rate and rhythm, no murmurs rubs or gallops, no lower extremity edema.  Respiratory: Clear to auscultation bilaterally. Not using accessory muscles, speaking in full sentences. Right shoulder: Inspection reveals no abnormalities, atrophy or asymmetry. Palpation is normal with no  tenderness over AC joint or bicipital groove. ROM is full in all planes. Rotator cuff strength normal throughout. Positive Neer and Hawkin's tests, empty can. Speeds and Yergason's tests normal. No labral pathology noted with negative Obrien's, negative crank, negative clunk, and good stability. Normal scapular function observed. No painful arc and no drop arm sign. No apprehension sign  Procedure: Real-time Ultrasound Guided Injection of right subacromial bursa Device: GE Logiq E  Verbal informed consent obtained.  Time-out conducted.  Noted no overlying erythema, induration, or other signs of local infection.  Skin prepped in a sterile  fashion.  Local anesthesia: Topical Ethyl chloride.  With sterile technique and under real time ultrasound guidance: 1 cc Kenalog 40, 1 cc lidocaine, 1 cc bupivacaine injected easily Completed without difficulty  Pain immediately resolved suggesting accurate placement of the medication.  Advised to call if fevers/chills, erythema, induration, drainage, or persistent bleeding.  Images permanently stored and available for review in the ultrasound unit.  Impression: Technically successful ultrasound guided injection.  Impression and Recommendations:    Acute pain of right shoulder Repeat subacromial injection, previous injection was 3 months ago. Return as needed. ___________________________________________ Gwen Her. Dianah Field, M.D., ABFM., CAQSM. Primary Care and Sports Medicine [facility], [place]  Adjunct Professor of [place] [employer] [facility] of Medicine

## 2018-11-26 NOTE — Assessment & Plan Note (Signed)
Repeat subacromial injection, previous injection was 3 months ago. Return as needed.

## 2019-01-25 DIAGNOSIS — L720 Epidermal cyst: Secondary | ICD-10-CM | POA: Diagnosis not present

## 2019-01-25 DIAGNOSIS — L821 Other seborrheic keratosis: Secondary | ICD-10-CM | POA: Diagnosis not present

## 2019-01-25 DIAGNOSIS — L570 Actinic keratosis: Secondary | ICD-10-CM | POA: Diagnosis not present

## 2019-02-16 ENCOUNTER — Ambulatory Visit (INDEPENDENT_AMBULATORY_CARE_PROVIDER_SITE_OTHER): Payer: Medicare HMO | Admitting: Sports Medicine

## 2019-02-16 ENCOUNTER — Other Ambulatory Visit: Payer: Self-pay

## 2019-02-16 DIAGNOSIS — M25511 Pain in right shoulder: Secondary | ICD-10-CM | POA: Diagnosis not present

## 2019-02-16 DIAGNOSIS — M75101 Unspecified rotator cuff tear or rupture of right shoulder, not specified as traumatic: Secondary | ICD-10-CM

## 2019-02-16 NOTE — Progress Notes (Signed)
Subjective:    CC: Right shoulder pain  HPI: This is a pleasant 68 year old female, we have been treating [name] for right shoulder pain for some time now, she does tell me that [name] most recent injection did not provide significant relief, though she desires another one today.  She has pain over the deltoid, worse with abduction.  Waking [name] from sleep.  Severe, persistent, localized without radiation.  I reviewed the past medical history, family history, social history, surgical history, and allergies today and no changes were needed.  Please see the problem list section below in epic for further details.  Past Medical History: Past Medical History:  Diagnosis Date  . Rosacea   . Tachycardia    Past Surgical History: Past Surgical History:  Procedure Laterality Date  . dilation curretage    . endometrial tumor    . NOSE SURGERY  1960  . PARTIAL HYSTERECTOMY  1979   removed one tube and one ovary   . TONSILLECTOMY  1957   Social History: Social History   Socioeconomic History  . Marital status: Married    Spouse name: Not on file  . Number of children: Not on file  . Years of education: Not on file  . Highest education level: Not on file  Occupational History  . Not on file  Social Needs  . Financial resource strain: Not on file  . Food insecurity:    Worry: Not on file    Inability: Not on file  . Transportation needs:    Medical: Not on file    Non-medical: Not on file  Tobacco Use  . Smoking status: Never Smoker  . Smokeless tobacco: Never Used  Substance and Sexual Activity  . Alcohol use: Yes    Alcohol/week: 0.0 standard drinks    Comment: 0-1 drinks per week  . Drug use: No  . Sexual activity: Not Currently  Lifestyle  . Physical activity:    Days per week: Not on file    Minutes per session: Not on file  . Stress: Not on file  Relationships  . Social connections:    Talks on phone: Not on file    Gets together: Not on file    Attends religious  service: Not on file    Active member of club or organization: Not on file    Attends meetings of clubs or organizations: Not on file    Relationship status: Not on file  Other Topics Concern  . Not on file  Social History Narrative  . Not on file   Family History: Family History  Problem Relation Age of Onset  . Diabetes Paternal Uncle   . Breast cancer Sister   . Stroke Paternal Grandmother   . Liver disease Father    Allergies: Allergies  Allergen Reactions  . Shellfish Allergy Nausea And Vomiting   Medications: See med rec.  Review of Systems: No fevers, chills, night sweats, weight loss, chest pain, or shortness of breath.   Objective:    General: Well Developed, well nourished, and in no acute distress.  Neuro: Alert and oriented x3, extra-ocular muscles intact, sensation grossly intact.  HEENT: Normocephalic, atraumatic, pupils equal round reactive to light, neck supple, no masses, no lymphadenopathy, thyroid nonpalpable.  Skin: Warm and dry, no rashes. Cardiac: Regular rate and rhythm, no murmurs rubs or gallops, no lower extremity edema.  Respiratory: Clear to auscultation bilaterally. Not using accessory muscles, speaking in full sentences. Right shoulder: Inspection reveals no abnormalities, atrophy or  asymmetry. Palpation is normal with no tenderness over AC joint or bicipital groove. ROM is full in all planes. Rotator cuff strength very weak to abduction Positive Neer and Hawkin's tests, empty can. Speeds and Yergason's tests normal. No labral pathology noted with negative Obrien's, negative crank, negative clunk, and good stability. Normal scapular function observed. No painful arc and no drop arm sign. No apprehension sign  Procedure: Real-time Ultrasound Guided injection of the right subacromial bursa Device: GE Logiq E  Verbal informed consent obtained.  Time-out conducted.  Noted no overlying erythema, induration, or other signs of local infection.   Skin prepped in a sterile fashion.  Local anesthesia: Topical Ethyl chloride.  With sterile technique and under real time ultrasound guidance:  Noted full-thickness retracted tear of the supraspinatus, 25-gauge needle advanced into the defect, and 1 cc Kenalog 40, 1 cc lidocaine, 1 cc bupivacaine injected, due to full-thickness rotator cuff tear this will serve as both the subacromial and glenohumeral injection as the spaces do communicate now. Completed without difficulty  Pain immediately resolved suggesting accurate placement of the medication.  Advised to call if fevers/chills, erythema, induration, drainage, or persistent bleeding.  Images permanently stored and available for review in the ultrasound unit.  Impression: Technically successful ultrasound guided injection.  Impression and Recommendations:    Tear of right supraspinatus tendon Noted full-thickness retracted supraspinatus tear. Patient was in severe pain today so we did an injection, we do need an MRI. Referral to Dr. Griffin Basil.   ___________________________________________ Donna [name]. Dianah Field, M.D., ABFM., CAQSM. Primary Care and Sports Medicine [facility]  Adjunct Professor of [place] [employer] [facility] of Medicine

## 2019-02-16 NOTE — Assessment & Plan Note (Signed)
Noted full-thickness retracted supraspinatus tear. Patient was in severe pain today so we did an injection, we do need an MRI. Referral to Dr. Griffin Basil.

## 2019-02-17 ENCOUNTER — Ambulatory Visit (INDEPENDENT_AMBULATORY_CARE_PROVIDER_SITE_OTHER): Payer: Medicare HMO | Admitting: Physician Assistant

## 2019-02-17 ENCOUNTER — Encounter: Payer: Self-pay | Admitting: Physician Assistant

## 2019-02-17 VITALS — BP 139/60 | HR 85 | Temp 98.7°F | Resp 16 | Ht 69.5 in | Wt 221.0 lb

## 2019-02-17 DIAGNOSIS — M79671 Pain in right foot: Secondary | ICD-10-CM

## 2019-02-17 MED ORDER — DICLOFENAC SODIUM 1 % TD GEL: 4.0000 g | Freq: Four times a day (QID) | TRANSDERMAL | 1 refills | Status: DC

## 2019-02-17 NOTE — Patient Instructions (Signed)

## 2019-02-17 NOTE — Progress Notes (Signed)
   Subjective:    Patient ID: Donna [name], female    DOB: 12-Nov-1951, 68 y.o.   MRN: [id number]  HPI  Patient is a 68 year old female who presents to the clinic with right heel pain for the last 7 to 8 weeks.  She remembers stepping down hard about the time it started hurting.  She thought it was getting better but it intermittently flares.  She feels like she is having more more pain.  Seems to hurt worse when she first puts pressure on it in the morning.  She is already taken 15 mg of Mobic for her right shoulder pain.  She has noticed little relief with this.  .. Active Ambulatory Problems    Diagnosis Date Noted  . Primary osteoarthritis of left knee 06/10/2016  . Laceration 06/10/2016  . Elevated LDL cholesterol level 06/10/2016  . Class 1 obesity due to excess calories without serious comorbidity with body mass index (BMI) of 32.0 to 32.9 in adult 07/26/2016  . Boil, labium 11/22/2016  . Overweight (BMI 25.0-29.9) 01/27/2017  . Bilateral hearing loss 08/23/2018  . Post-menopausal 08/23/2018  . Tear of right supraspinatus tendon 08/23/2018  . Osteopenia 09/02/2018  . Pain of right heel 02/19/2019   Resolved Ambulatory Problems    Diagnosis Date Noted  . Left knee pain 06/10/2016   Past Medical History:  Diagnosis Date  . Rosacea   . Tachycardia      Review of Systems    see HPI.  Objective:   Physical Exam Vitals signs reviewed.  Musculoskeletal:     Comments: No redness, swelling right foot.  Tenderness over right heel to palpitation.  Achilles tendon intact.  NROM of right foot.    Neurological:     General: No focal deficit present.     Mental Status: She is alert.           Assessment & Plan:  Marland KitchenMarland KitchenShila was seen today for right heel pain.  Diagnoses and all orders for this visit:  Pain of right heel -     diclofenac sodium (VOLTAREN) 1 % GEL; Apply 4 g topically 4 (four) times daily. To affected joint.  Other orders -     AMBULATORY NON  FORMULARY MEDICATION; Flex it Tens unit with sock for plantar fasciitis and right heel pain.   Sounds classic plantar fasciitis. Already on mobic. Given voltaren to use topically. Wear good supportive shoes or consider orthotics to be made. Use ice regularly. Try to rest it for 2 weeks. Start exercises. Will rx tens unit to see if can help with pain and recovery. If not improving follow up with sports med to consider injection. She is already seeing Dr. Darene Lamer for right shoulder pain. HO given.

## 2019-02-19 ENCOUNTER — Encounter: Payer: Self-pay | Admitting: Physician Assistant

## 2019-02-19 DIAGNOSIS — M25511 Pain in right shoulder: Secondary | ICD-10-CM | POA: Diagnosis not present

## 2019-02-19 DIAGNOSIS — M722 Plantar fascial fibromatosis: Secondary | ICD-10-CM | POA: Insufficient documentation

## 2019-02-19 MED ORDER — AMBULATORY NON FORMULARY MEDICATION: 0 refills | Status: DC

## 2019-02-25 ENCOUNTER — Encounter: Payer: Self-pay | Admitting: Sports Medicine

## 2019-02-25 ENCOUNTER — Ambulatory Visit (INDEPENDENT_AMBULATORY_CARE_PROVIDER_SITE_OTHER): Payer: Medicare HMO | Admitting: Sports Medicine

## 2019-02-25 ENCOUNTER — Other Ambulatory Visit: Payer: Self-pay

## 2019-02-25 ENCOUNTER — Ambulatory Visit (INDEPENDENT_AMBULATORY_CARE_PROVIDER_SITE_OTHER): Payer: Medicare HMO

## 2019-02-25 DIAGNOSIS — M722 Plantar fascial fibromatosis: Secondary | ICD-10-CM

## 2019-02-25 DIAGNOSIS — M25511 Pain in right shoulder: Secondary | ICD-10-CM | POA: Diagnosis not present

## 2019-02-25 DIAGNOSIS — M7732 Calcaneal spur, left foot: Secondary | ICD-10-CM | POA: Diagnosis not present

## 2019-02-25 DIAGNOSIS — M7730 Calcaneal spur, unspecified foot: Secondary | ICD-10-CM | POA: Diagnosis not present

## 2019-02-25 NOTE — Assessment & Plan Note (Signed)
We will start conservatively, cam boot, air heel brace. Rehab exercises. Declines analgesics. X-rays today. We did make her custom orthotics in 2017. Return to see me in a month, injection if no better.

## 2019-02-25 NOTE — Progress Notes (Signed)
Subjective:    CC: Severe heel pain  HPI: For the past several days this pleasant 68 year old female has had severe pain she localizes on the plantar aspect of her right heel, this occurred after taking a misstep.  Oral analgesics are not effective, pain is localized without radiation.  Worse with most steps, not particularly worse in the mornings.  I reviewed the past medical history, family history, social history, surgical history, and allergies today and no changes were needed.  Please see the problem list section below in epic for further details.  Past Medical History: Past Medical History:  Diagnosis Date  . Rosacea   . Tachycardia    Past Surgical History: Past Surgical History:  Procedure Laterality Date  . dilation curretage    . endometrial tumor    . NOSE SURGERY  1960  . PARTIAL HYSTERECTOMY  1979   removed one tube and one ovary   . TONSILLECTOMY  1957   Social History: Social History   Socioeconomic History  . Marital status: Married    Spouse name: Not on file  . Number of children: Not on file  . Years of education: Not on file  . Highest education level: Not on file  Occupational History  . Not on file  Social Needs  . Financial resource strain: Not on file  . Food insecurity:    Worry: Not on file    Inability: Not on file  . Transportation needs:    Medical: Not on file    Non-medical: Not on file  Tobacco Use  . Smoking status: Never Smoker  . Smokeless tobacco: Never Used  Substance and Sexual Activity  . Alcohol use: Yes    Alcohol/week: 0.0 standard drinks    Comment: 0-1 drinks per week  . Drug use: No  . Sexual activity: Not Currently  Lifestyle  . Physical activity:    Days per week: Not on file    Minutes per session: Not on file  . Stress: Not on file  Relationships  . Social connections:    Talks on phone: Not on file    Gets together: Not on file    Attends religious service: Not on file    Active member of club or  organization: Not on file    Attends meetings of clubs or organizations: Not on file    Relationship status: Not on file  Other Topics Concern  . Not on file  Social History Narrative  . Not on file   Family History: Family History  Problem Relation Age of Onset  . Diabetes Paternal Uncle   . Breast cancer Sister   . Stroke Paternal Grandmother   . Liver disease Father    Allergies: Allergies  Allergen Reactions  . Shellfish Allergy Nausea And Vomiting   Medications: See med rec.  Review of Systems: No fevers, chills, night sweats, weight loss, chest pain, or shortness of breath.   Objective:    General: Well Developed, well nourished, and in no acute distress.  Neuro: Alert and oriented x3, extra-ocular muscles intact, sensation grossly intact.  HEENT: Normocephalic, atraumatic, pupils equal round reactive to light, neck supple, no masses, no lymphadenopathy, thyroid nonpalpable.  Skin: Warm and dry, no rashes. Cardiac: Regular rate and rhythm, no murmurs rubs or gallops, no lower extremity edema.  Respiratory: Clear to auscultation bilaterally. Not using accessory muscles, speaking in full sentences. Right foot: No visible erythema or swelling. Range of motion is full in all directions. Strength is  5/5 in all directions. No hallux valgus. Pes cavus No abnormal callus noted. No pain over the navicular prominence, or base of fifth metatarsal. Moderate tenderness to palpation of the calcaneal insertion of plantar fascia. No pain at the Achilles insertion. No pain over the calcaneal bursa. No pain of the retrocalcaneal bursa. No tenderness to palpation over the tarsals, metatarsals, or phalanges. No hallux rigidus or limitus. No tenderness palpation over interphalangeal joints. No pain with compression of the metatarsal heads. Neurovascularly intact distally.  Cam boot and air heel brace both placed.  X-rays reviewed, she does have calcifications in the proximal  plantar fascia.  No acute fractures.  Impression and Recommendations:    Plantar fasciitis, right We will start conservatively, cam boot, air heel brace. Rehab exercises. Declines analgesics. X-rays today. We did make her custom orthotics in 2017. Return to see me in a month, injection if no better.   ___________________________________________ Gwen Her. Dianah Field, M.D., ABFM., CAQSM. Primary Care and Sports Medicine [facility]  Adjunct Professor of [place] [employer] [facility] of Medicine

## 2019-03-03 ENCOUNTER — Ambulatory Visit: Payer: Medicare HMO | Admitting: Physician Assistant

## 2019-03-25 ENCOUNTER — Ambulatory Visit: Payer: Medicare HMO | Admitting: Sports Medicine

## 2019-03-26 ENCOUNTER — Ambulatory Visit (INDEPENDENT_AMBULATORY_CARE_PROVIDER_SITE_OTHER): Payer: Medicare HMO | Admitting: Sports Medicine

## 2019-03-26 ENCOUNTER — Encounter: Payer: Self-pay | Admitting: Sports Medicine

## 2019-03-26 DIAGNOSIS — M722 Plantar fascial fibromatosis: Secondary | ICD-10-CM | POA: Diagnosis not present

## 2019-03-26 NOTE — Progress Notes (Signed)
Subjective:    CC: Persistent foot pain  HPI: This is a pleasant 68 year old female, we have been treating her for plantar fasciitis, she has orthotics, and air heel brace, unfortunately in spite of rehab exercises, avoiding barefoot walking, she continues to have discomfort, approximately same as before.  She tells me is not bad enough to consider an injection, moderate, persistent, localized without radiation.  I reviewed the past medical history, family history, social history, surgical history, and allergies today and no changes were needed.  Please see the problem list section below in epic for further details.  Past Medical History: Past Medical History:  Diagnosis Date  . Rosacea   . Tachycardia    Past Surgical History: Past Surgical History:  Procedure Laterality Date  . dilation curretage    . endometrial tumor    . NOSE SURGERY  1960  . PARTIAL HYSTERECTOMY  1979   removed one tube and one ovary   . TONSILLECTOMY  1957   Social History: Social History   Socioeconomic History  . Marital status: Married    Spouse name: Not on file  . Number of children: Not on file  . Years of education: Not on file  . Highest education level: Not on file  Occupational History  . Not on file  Social Needs  . Financial resource strain: Not on file  . Food insecurity:    Worry: Not on file    Inability: Not on file  . Transportation needs:    Medical: Not on file    Non-medical: Not on file  Tobacco Use  . Smoking status: Never Smoker  . Smokeless tobacco: Never Used  Substance and Sexual Activity  . Alcohol use: Yes    Alcohol/week: 0.0 standard drinks    Comment: 0-1 drinks per week  . Drug use: No  . Sexual activity: Not Currently  Lifestyle  . Physical activity:    Days per week: Not on file    Minutes per session: Not on file  . Stress: Not on file  Relationships  . Social connections:    Talks on phone: Not on file    Gets together: Not on file    Attends  religious service: Not on file    Active member of club or organization: Not on file    Attends meetings of clubs or organizations: Not on file    Relationship status: Not on file  Other Topics Concern  . Not on file  Social History Narrative  . Not on file   Family History: Family History  Problem Relation Age of Onset  . Diabetes Paternal Uncle   . Breast cancer Sister   . Stroke Paternal Grandmother   . Liver disease Father    Allergies: Allergies  Allergen Reactions  . Shellfish Allergy Nausea And Vomiting   Medications: See med rec.  Review of Systems: No fevers, chills, night sweats, weight loss, chest pain, or shortness of breath.   Objective:    General: Well Developed, well nourished, and in no acute distress.  Neuro: Alert and oriented x3, extra-ocular muscles intact, sensation grossly intact.  HEENT: Normocephalic, atraumatic, pupils equal round reactive to light, neck supple, no masses, no lymphadenopathy, thyroid nonpalpable.  Skin: Warm and dry, no rashes. Cardiac: Regular rate and rhythm, no murmurs rubs or gallops, no lower extremity edema.  Respiratory: Clear to auscultation bilaterally. Not using accessory muscles, speaking in full sentences.  Impression and Recommendations:    Plantar fasciitis, right We did  some conservative treatment initially, air heel brace, short period of time with a cam boot. Really not much better, she will increase her rehabilitation exercises, rolling with a cold bottle. We did build her custom orthotics in 2017. Return to see me in a couple of weeks, at that point we will do an injection if no better, she was not hurting enough today to consider an injection.  I spent 25 minutes with this patient, greater than 50% was face-to-face time counseling regarding the above diagnoses.  ___________________________________________ Gwen Her. Dianah Field, M.D., ABFM., CAQSM. Primary Care and Sports Medicine [facility]  [facility]  Adjunct Professor of [place] [employer] of Medicine

## 2019-03-26 NOTE — Assessment & Plan Note (Signed)
We did some conservative treatment initially, air heel brace, short period of time with a cam boot. Really not much better, she will increase her rehabilitation exercises, rolling with a cold bottle. We did build her custom orthotics in 2017. Return to see me in a couple of weeks, at that point we will do an injection if no better, she was not hurting enough today to consider an injection.

## 2019-04-09 ENCOUNTER — Other Ambulatory Visit: Payer: Self-pay

## 2019-04-09 ENCOUNTER — Encounter (HOSPITAL_BASED_OUTPATIENT_CLINIC_OR_DEPARTMENT_OTHER): Payer: Self-pay | Admitting: *Deleted

## 2019-04-12 ENCOUNTER — Other Ambulatory Visit (HOSPITAL_COMMUNITY): Payer: Medicare HMO

## 2019-04-13 ENCOUNTER — Other Ambulatory Visit (HOSPITAL_COMMUNITY)
Admission: RE | Admit: 2019-04-13 | Discharge: 2019-04-13 | Disposition: A | Discharge status: Home / Self Care | Payer: Medicare HMO | Source: Ambulatory Visit | Attending: Orthopaedic Surgery | Admitting: Orthopaedic Surgery

## 2019-04-13 ENCOUNTER — Other Ambulatory Visit: Payer: Self-pay

## 2019-04-13 DIAGNOSIS — Z1159 Encounter for screening for other viral diseases: Secondary | ICD-10-CM | POA: Insufficient documentation

## 2019-04-14 LAB — NOVEL CORONAVIRUS, NAA (HOSP ORDER, SEND-OUT TO REF LAB; TAT 18-24 HRS): SARS-CoV-2, NAA: NOT DETECTED

## 2019-04-15 ENCOUNTER — Encounter (HOSPITAL_BASED_OUTPATIENT_CLINIC_OR_DEPARTMENT_OTHER): Payer: Self-pay | Admitting: *Deleted

## 2019-04-15 ENCOUNTER — Ambulatory Visit (HOSPITAL_BASED_OUTPATIENT_CLINIC_OR_DEPARTMENT_OTHER)
Admission: RE | Admit: 2019-04-15 | Discharge: 2019-04-15 | Disposition: A | Discharge status: Home / Self Care | Payer: Medicare HMO | Attending: Orthopaedic Surgery | Admitting: Orthopaedic Surgery

## 2019-04-15 ENCOUNTER — Other Ambulatory Visit: Payer: Self-pay

## 2019-04-15 ENCOUNTER — Ambulatory Visit (HOSPITAL_BASED_OUTPATIENT_CLINIC_OR_DEPARTMENT_OTHER): Payer: Medicare HMO | Admitting: Anesthesiology

## 2019-04-15 ENCOUNTER — Encounter (HOSPITAL_BASED_OUTPATIENT_CLINIC_OR_DEPARTMENT_OTHER)
Admission: RE | Disposition: A | Discharge status: Home / Self Care | Payer: Self-pay | Source: Home / Self Care | Attending: Orthopaedic Surgery

## 2019-04-15 DIAGNOSIS — G8918 Other acute postprocedural pain: Secondary | ICD-10-CM | POA: Diagnosis not present

## 2019-04-15 DIAGNOSIS — M25811 Other specified joint disorders, right shoulder: Secondary | ICD-10-CM | POA: Insufficient documentation

## 2019-04-15 DIAGNOSIS — M7521 Bicipital tendinitis, right shoulder: Secondary | ICD-10-CM | POA: Insufficient documentation

## 2019-04-15 DIAGNOSIS — M19011 Primary osteoarthritis, right shoulder: Secondary | ICD-10-CM | POA: Diagnosis not present

## 2019-04-15 DIAGNOSIS — M75101 Unspecified rotator cuff tear or rupture of right shoulder, not specified as traumatic: Secondary | ICD-10-CM | POA: Insufficient documentation

## 2019-04-15 DIAGNOSIS — M949 Disorder of cartilage, unspecified: Secondary | ICD-10-CM | POA: Diagnosis not present

## 2019-04-15 DIAGNOSIS — M7541 Impingement syndrome of right shoulder: Secondary | ICD-10-CM | POA: Diagnosis not present

## 2019-04-15 DIAGNOSIS — M24111 Other articular cartilage disorders, right shoulder: Secondary | ICD-10-CM | POA: Diagnosis not present

## 2019-04-15 DIAGNOSIS — M1712 Unilateral primary osteoarthritis, left knee: Secondary | ICD-10-CM

## 2019-04-15 DIAGNOSIS — S46011A Strain of muscle(s) and tendon(s) of the rotator cuff of right shoulder, initial encounter: Secondary | ICD-10-CM | POA: Diagnosis not present

## 2019-04-15 HISTORY — PX: SHOULDER ARTHROSCOPY WITH ROTATOR CUFF REPAIR AND SUBACROMIAL DECOMPRESSION: SHX5686

## 2019-04-15 SURGERY — SHOULDER ARTHROSCOPY WITH ROTATOR CUFF REPAIR AND SUBACROMIAL DECOMPRESSION
Anesthesia: General | Site: Shoulder | Laterality: Right

## 2019-04-15 MED ORDER — OXYCODONE HCL 5 MG PO TABS: 5.0000 mg | ORAL_TABLET | Freq: Once | ORAL | Status: DC | PRN

## 2019-04-15 MED ORDER — CEFAZOLIN SODIUM-DEXTROSE 2-4 GM/100ML-% IV SOLN
2.0000 g | INTRAVENOUS | Status: AC
  Administered 2019-04-15: 2 g via INTRAVENOUS

## 2019-04-15 MED ORDER — BUPIVACAINE LIPOSOME 1.3 % IJ SUSP
INTRAMUSCULAR | Status: DC | PRN
  Administered 2019-04-15: 10 mL

## 2019-04-15 MED ORDER — CHLORHEXIDINE GLUCONATE 4 % EX LIQD: 60.0000 mL | Freq: Once | CUTANEOUS | Status: DC

## 2019-04-15 MED ORDER — ONDANSETRON HCL 4 MG/2ML IJ SOLN
INTRAMUSCULAR | Status: DC | PRN
  Administered 2019-04-15: 4 mg via INTRAVENOUS

## 2019-04-15 MED ORDER — DEXAMETHASONE SODIUM PHOSPHATE 4 MG/ML IJ SOLN
INTRAMUSCULAR | Status: DC | PRN
  Administered 2019-04-15: 10 mg via INTRAVENOUS

## 2019-04-15 MED ORDER — SODIUM CHLORIDE 0.9 % IV SOLN
INTRAVENOUS | Status: DC | PRN
  Administered 2019-04-15: 50 ug/min via INTRAVENOUS

## 2019-04-15 MED ORDER — DEXAMETHASONE SODIUM PHOSPHATE 10 MG/ML IJ SOLN
INTRAMUSCULAR | Status: AC
  Filled 2019-04-15: qty 1

## 2019-04-15 MED ORDER — PHENYLEPHRINE HCL (PRESSORS) 10 MG/ML IV SOLN
INTRAVENOUS | Status: DC | PRN
  Administered 2019-04-15 (×2): 80 ug via INTRAVENOUS

## 2019-04-15 MED ORDER — LACTATED RINGERS IV SOLN
INTRAVENOUS | Status: DC
  Administered 2019-04-15: 10:00:00 via INTRAVENOUS
  Administered 2019-04-15: 09:00:00 10 mL/h via INTRAVENOUS

## 2019-04-15 MED ORDER — BUPIVACAINE HCL (PF) 0.5 % IJ SOLN
INTRAMUSCULAR | Status: DC | PRN
  Administered 2019-04-15: 15 mL via PERINEURAL

## 2019-04-15 MED ORDER — ONDANSETRON HCL 4 MG/2ML IJ SOLN
INTRAMUSCULAR | Status: AC
  Filled 2019-04-15: qty 2

## 2019-04-15 MED ORDER — ONDANSETRON HCL 4 MG/2ML IJ SOLN: 4.0000 mg | Freq: Once | INTRAMUSCULAR | Status: DC | PRN

## 2019-04-15 MED ORDER — ACETAMINOPHEN 500 MG PO TABS: 1000.0000 mg | ORAL_TABLET | Freq: Three times a day (TID) | ORAL | 0 refills | Status: AC

## 2019-04-15 MED ORDER — FENTANYL CITRATE (PF) 100 MCG/2ML IJ SOLN
INTRAMUSCULAR | Status: AC
  Filled 2019-04-15: qty 2

## 2019-04-15 MED ORDER — OXYCODONE HCL 5 MG PO TABS: ORAL_TABLET | ORAL | 0 refills | Status: AC

## 2019-04-15 MED ORDER — OMEPRAZOLE 20 MG PO CPDR: 20.0000 mg | DELAYED_RELEASE_CAPSULE | Freq: Every day | ORAL | 0 refills | Status: DC

## 2019-04-15 MED ORDER — MIDAZOLAM HCL 2 MG/2ML IJ SOLN
1.0000 mg | INTRAMUSCULAR | Status: DC | PRN
  Administered 2019-04-15: 1 mg via INTRAVENOUS

## 2019-04-15 MED ORDER — PROPOFOL 10 MG/ML IV BOLUS
INTRAVENOUS | Status: DC | PRN
  Administered 2019-04-15: 150 mg via INTRAVENOUS
  Administered 2019-04-15: 30 mg via INTRAVENOUS

## 2019-04-15 MED ORDER — EPHEDRINE SULFATE 50 MG/ML IJ SOLN
INTRAMUSCULAR | Status: DC | PRN
  Administered 2019-04-15 (×2): 10 mg via INTRAVENOUS

## 2019-04-15 MED ORDER — SODIUM CHLORIDE 0.9 % IR SOLN
Status: DC | PRN
  Administered 2019-04-15: 10000 mL

## 2019-04-15 MED ORDER — FENTANYL CITRATE (PF) 100 MCG/2ML IJ SOLN: 25.0000 ug | INTRAMUSCULAR | Status: DC | PRN

## 2019-04-15 MED ORDER — ONDANSETRON HCL 4 MG PO TABS: 4.0000 mg | ORAL_TABLET | Freq: Three times a day (TID) | ORAL | 1 refills | Status: AC | PRN

## 2019-04-15 MED ORDER — MIDAZOLAM HCL 2 MG/2ML IJ SOLN
INTRAMUSCULAR | Status: AC
  Filled 2019-04-15: qty 2

## 2019-04-15 MED ORDER — BUPIVACAINE-EPINEPHRINE (PF) 0.25% -1:200000 IJ SOLN
INTRAMUSCULAR | Status: AC
  Filled 2019-04-15: qty 30

## 2019-04-15 MED ORDER — MELOXICAM 15 MG PO TABS: 15.0000 mg | ORAL_TABLET | Freq: Every day | ORAL | 3 refills | Status: DC

## 2019-04-15 MED ORDER — SUCCINYLCHOLINE CHLORIDE 20 MG/ML IJ SOLN
INTRAMUSCULAR | Status: DC | PRN
  Administered 2019-04-15: 100 mg via INTRAVENOUS

## 2019-04-15 MED ORDER — CEFAZOLIN SODIUM-DEXTROSE 2-4 GM/100ML-% IV SOLN
INTRAVENOUS | Status: AC
  Filled 2019-04-15: qty 100

## 2019-04-15 MED ORDER — LIDOCAINE HCL (CARDIAC) PF 100 MG/5ML IV SOSY
PREFILLED_SYRINGE | INTRAVENOUS | Status: DC | PRN
  Administered 2019-04-15: 60 mg via INTRAVENOUS

## 2019-04-15 MED ORDER — OXYCODONE HCL 5 MG/5ML PO SOLN: 5.0000 mg | Freq: Once | ORAL | Status: DC | PRN

## 2019-04-15 MED ORDER — SCOPOLAMINE 1 MG/3DAYS TD PT72: 1.0000 | MEDICATED_PATCH | Freq: Once | TRANSDERMAL | Status: DC | PRN

## 2019-04-15 MED ORDER — EPINEPHRINE PF 1 MG/ML IJ SOLN
INTRAMUSCULAR | Status: AC
  Filled 2019-04-15: qty 1

## 2019-04-15 MED ORDER — FENTANYL CITRATE (PF) 100 MCG/2ML IJ SOLN
50.0000 ug | INTRAMUSCULAR | Status: DC | PRN
  Administered 2019-04-15: 50 ug via INTRAVENOUS
  Administered 2019-04-15: 25 ug via INTRAVENOUS

## 2019-04-15 MED ORDER — PROPOFOL 500 MG/50ML IV EMUL
INTRAVENOUS | Status: AC
  Filled 2019-04-15: qty 50

## 2019-04-15 SURGICAL SUPPLY — 71 items
ANCHOR SUT FBRTK 2 TIGTAIL (Anchor) ×4 IMPLANT
BLADE EXCALIBUR 4.0X13 (MISCELLANEOUS) ×2 IMPLANT
BLADE SHAVER BONE 5.0X13 (MISCELLANEOUS) IMPLANT
BNDG COHESIVE 4X5 TAN STRL (GAUZE/BANDAGES/DRESSINGS) IMPLANT
BURR OVAL 8 FLU 4.0X13 (MISCELLANEOUS) ×2 IMPLANT
CANNULA 5.75X71 LONG (CANNULA) IMPLANT
CANNULA PASSPORT 5 (CANNULA) IMPLANT
CANNULA PASSPORT BUTTON 10-40 (CANNULA) ×2 IMPLANT
CANNULA TWIST IN 8.25X7CM (CANNULA) IMPLANT
CHLORAPREP W/TINT 26 (MISCELLANEOUS) ×4 IMPLANT
COVER WAND RF STERILE (DRAPES) IMPLANT
DECANTER SPIKE VIAL GLASS SM (MISCELLANEOUS) IMPLANT
DISSECTOR 3.5MM X 13CM CVD (MISCELLANEOUS) IMPLANT
DISSECTOR 4.0MMX13CM CVD (MISCELLANEOUS) IMPLANT
DRAPE IMP U-DRAPE 54X76 (DRAPES) ×2 IMPLANT
DRAPE INCISE IOBAN 66X45 STRL (DRAPES) IMPLANT
DRAPE SHOULDER BEACH CHAIR (DRAPES) ×2 IMPLANT
DRSG PAD ABDOMINAL 8X10 ST (GAUZE/BANDAGES/DRESSINGS) ×2 IMPLANT
DW OUTFLOW CASSETTE/TUBE SET (MISCELLANEOUS) ×2 IMPLANT
ELECT NEEDLE TIP 2.8 STRL (NEEDLE) IMPLANT
ELECT REM PT RETURN 9FT ADLT (ELECTROSURGICAL) ×2
ELECTRODE REM PT RTRN 9FT ADLT (ELECTROSURGICAL) ×1 IMPLANT
GAUZE SPONGE 4X4 12PLY STRL (GAUZE/BANDAGES/DRESSINGS) ×2 IMPLANT
GAUZE XEROFORM 1X8 LF (GAUZE/BANDAGES/DRESSINGS) IMPLANT
GLOVE BIOGEL PI IND STRL 8 (GLOVE) ×1 IMPLANT
GLOVE BIOGEL PI INDICATOR 8 (GLOVE) ×1
GLOVE ECLIPSE 8.0 STRL XLNG CF (GLOVE) ×4 IMPLANT
GOWN STRL REUS W/ TWL LRG LVL3 (GOWN DISPOSABLE) ×2 IMPLANT
GOWN STRL REUS W/TWL LRG LVL3 (GOWN DISPOSABLE) ×2
GOWN STRL REUS W/TWL XL LVL3 (GOWN DISPOSABLE) ×2 IMPLANT
IMPL SPEEDBRIDGE KIT (Orthopedic Implant) ×1 IMPLANT
IMPLANT SPEEDBRIDGE KIT (Orthopedic Implant) ×2 IMPLANT
KIT SPEAR STR 1.6MM DRILL (MISCELLANEOUS) ×2 IMPLANT
KIT STABILIZATION SHOULDER (MISCELLANEOUS) ×2 IMPLANT
KIT STR SPEAR 1.8 FBRTK DISP (KITS) IMPLANT
LASSO 90 CVE QUICKPAS (DISPOSABLE) IMPLANT
MANIFOLD NEPTUNE II (INSTRUMENTS) ×2 IMPLANT
NDL SAFETY ECLIPSE 18X1.5 (NEEDLE) ×1 IMPLANT
NDL SUT 6 .5 CRC .975X.05 MAYO (NEEDLE) IMPLANT
NEEDLE HYPO 18GX1.5 SHARP (NEEDLE) ×1
NEEDLE MAYO TAPER (NEEDLE)
NEEDLE SCORPION MULTI FIRE (NEEDLE) IMPLANT
PACK ARTHROSCOPY DSU (CUSTOM PROCEDURE TRAY) ×2 IMPLANT
PACK BASIN DAY SURGERY FS (CUSTOM PROCEDURE TRAY) ×2 IMPLANT
PENCIL BUTTON HOLSTER BLD 10FT (ELECTRODE) IMPLANT
PORT APPOLLO RF 90DEGREE MULTI (SURGICAL WAND) ×2 IMPLANT
RESTRAINT HEAD UNIVERSAL NS (MISCELLANEOUS) ×2 IMPLANT
SHEET MEDIUM DRAPE 40X70 STRL (DRAPES) ×2 IMPLANT
SLEEVE SCD COMPRESS KNEE MED (MISCELLANEOUS) ×2 IMPLANT
SLING ARM FOAM STRAP LRG (SOFTGOODS) IMPLANT
SLING ARM IMMOBILIZER LRG (SOFTGOODS) IMPLANT
SLING ARM IMMOBILIZER MED (SOFTGOODS) IMPLANT
SLING ARM MED ADULT FOAM STRAP (SOFTGOODS) IMPLANT
SLING ARM XL FOAM STRAP (SOFTGOODS) IMPLANT
SPONGE LAP 4X18 RFD (DISPOSABLE) IMPLANT
STRIP CLOSURE SKIN 1/2X4 (GAUZE/BANDAGES/DRESSINGS) IMPLANT
SUCTION FRAZIER HANDLE 10FR (MISCELLANEOUS)
SUCTION TUBE FRAZIER 10FR DISP (MISCELLANEOUS) IMPLANT
SUT FIBERWIRE #2 38 T-5 BLUE (SUTURE)
SUT MNCRL AB 4-0 PS2 18 (SUTURE) ×2 IMPLANT
SUT PDS AB 1 CT  36 (SUTURE)
SUT PDS AB 1 CT 36 (SUTURE) IMPLANT
SUT TIGER TAPE 7 IN WHITE (SUTURE) IMPLANT
SUTURE FIBERWR #2 38 T-5 BLUE (SUTURE) IMPLANT
SUTURE TAPE TIGERLINK 1.3MM BL (SUTURE) IMPLANT
SUTURETAPE TIGERLINK 1.3MM BL (SUTURE)
SYR 5ML LUER SLIP (SYRINGE) ×2 IMPLANT
TAPE FIBER 2MM 7IN #2 BLUE (SUTURE) IMPLANT
TOWEL GREEN STERILE FF (TOWEL DISPOSABLE) ×4 IMPLANT
TUBE SUCTION HIGH CAP CLEAR NV (SUCTIONS) IMPLANT
TUBING ARTHROSCOPY IRRIG 16FT (MISCELLANEOUS) ×2 IMPLANT

## 2019-04-15 NOTE — Transfer of Care (Signed)
Immediate Anesthesia Transfer of Care Note  Patient: Donna [name]  Procedure(s) Performed: RIGHT SHOULDER ARTHROSCOPY WITH ROTATOR CUFF REPAIR AND SUBACROMIAL DECOMPRESSION, DISTAL CLAVICULECTOMY, BICEPS TENODESIS (Right Shoulder)  Patient Location: PACU  Anesthesia Type:GA combined with regional for post-op pain  Level of Consciousness: sedated  Airway & Oxygen Therapy: Patient Spontanous Breathing and Patient connected to nasal cannula oxygen  Post-op Assessment: Report given to RN and Post -op Vital signs reviewed and stable  Post vital signs: Reviewed and stable  Last Vitals:  Vitals Value Taken Time  BP 130/61 04/15/2019 11:42 AM  Temp    Pulse 85 04/15/2019 11:45 AM  Resp 17 04/15/2019 11:45 AM  SpO2 99 % 04/15/2019 11:45 AM  Vitals shown include unvalidated device data.  Last Pain:  Vitals:   04/15/19 0821  TempSrc:   PainSc: 2       Patients Stated Pain Goal: 1 (64/31/42 7670)  Complications: No apparent anesthesia complications

## 2019-04-15 NOTE — Anesthesia Procedure Notes (Signed)
Anesthesia Regional Block: Interscalene brachial plexus block   Pre-Anesthetic Checklist: ,, timeout performed, Correct Patient, Correct Site, Correct Laterality, Correct Procedure, Correct Position, site marked, Risks and benefits discussed,  Surgical consent,  Pre-op evaluation,  At surgeon's request and post-op pain management  Laterality: Right  Prep: chloraprep       Needles:  Injection technique: Single-shot  Needle Type: Echogenic Stimulator Needle     Needle Length: 9cm  Needle Gauge: 21     Additional Needles:   Procedures:,,,, ultrasound used (permanent image in chart),,,,  Narrative:  Start time: 04/15/2019 9:00 AM End time: 04/15/2019 9:06 AM Injection made incrementally with aspirations every 5 mL.  Performed by: Personally  Anesthesiologist: Lidia Collum, MD  Additional Notes: Monitors applied. Injection made in 5cc increments. No resistance to injection. Good needle visualization. Patient tolerated procedure well.

## 2019-04-15 NOTE — Op Note (Signed)
Orthopaedic Surgery Operative Note (CSN: [id number])  Donna [name]  08-Apr-1951 Date of Surgery: 04/15/2019   Diagnoses:  RIGHT SHOULDER CARTILAGE DISORDER, IMPINGEMENT, OSTEOARTHRITIS, ROTATOR CUFF TEAR. BICIPITAL TENDONITITIS  Procedure: Right shoulder extensive debridement arthroscopic Arthroscopic distal clavicle excision Arthroscopic biceps tenodesis Arthroscopic rotator cuff repair Arthroscopic subacromial decompression   Operative Finding Exam under anesthesia: Full motion no limitation Articular space: No loose bodies, capsule intact, degenerative labral fraying throughout. Chondral surfaces:Intact on the humerus, there is some mild cartilage damage in the central portion of the glenoid.  She just inferior to the bare spot. Biceps: Type II SLAP tear with significant injection of the biceps. Subscapularis: Intact with intact [facility] Superior Cuff: Obvious anterior supraspinatus tear full-thickness with relatively poor cuff quality. Bursal side: Type II acromion converted type I.  Relatively poor tendon quality, we will restrict physical therapy to 6 weeks postop.  Patient may be a candidate for a SCR or reverse if she failed the surgery.   Post-operative plan: The patient will be nonweightbearing and sling for 6 weeks.  The patient will be discharged home.  DVT prophylaxis not indicated in isolated upper extremity surgery patient with no specific risks factors .  Pain control with PRN pain medication preferring oral medicines.  Follow up plan will be scheduled in approximately 7 days for incision check and XR.  Post-Op Diagnosis: Same Surgeons:Primary: Hiram Gash, MD Assistants: Location: [facility] OR ROOM 2 Anesthesia: General Antibiotics: Ancef 2g preop Tourniquet time: * No tourniquets in log * Estimated Blood Loss: minimal Complications: None Specimens: None Implants: Implant Name Type Inv. Item Serial No. Manufacturer Lot No. LRB No. Used Action  IMPLANT SPEEDBRIDGE  KIT - [id number] Orthopedic Implant IMPLANT SPEEDBRIDGE KIT  [employer] [id number] Right 1 Implanted  [facility] SUTURE - [id number] Anchor [employer] [id number] Right 2 Implanted    Indications for Surgery:   Donna [name] is a 68 y.o. female with continued right shoulder pain refractory to injections and physical therapy.  MRI demonstrated a complete supraspinatus tear and possible subscapularis versus bicipital pathology.  Benefits and risks of operative and nonoperative management were discussed prior to surgery with patient/guardian(s) and informed consent form was completed.  Specific risks including infection, need for additional surgery, stiffness, re-tear, continued pain and biceps failure.   Procedure:    Patient was correctly identified in the preoperative holding area and operative site marked.  Patient brought to OR and positioned beachchair on an [place] table ensuring that all bony prominences were padded and the head was in an appropriate location.  Anesthesia was induced and the operative shoulder was prepped and draped in the usual sterile fashion.  Timeout was called preincision.  A standard posterior viewing portal was made after localizing the portal with a spinal needle.  An anterior accessory portal was also made.  After clearing the articular space the camera was positioned in the subacromial space.  Findings above.  Extensive debridement was performed with a shaver of the labrum, anterior interval, cuff and bursa.  Subacromial decompression: We made a lateral portal with spinal needle guidance. We then proceeded to debride bursal tissue extensively with a shaver and arthrocare device. At that point we continued to identify the borders of the acromion and identify the spur. We then carefully preserved the deltoid fascia and used a burr to convert the type 2 acromion to a Type 1 flat acromion without issue.  Arthroscopic Rotator Cuff Repair:  Tuberosity was prepared with a burr to  a bleeding bed.  Following completion of the above we placed 2 4.7 Swivelock anchor loaded with a tape at inserted at the medial articular margin and an scorpion suture passing device, shuttled  utures medially in a horizontal mattress suture configuration.  We then tied using arthroscopic knot tying techniques  each suture to its partner reducing the tendon at the prepared insertion site.  The fiber tape was not tied. With a medial row suture limbs then incorporated, 4 anteriorly and  4 posteriorly, into each of two 4.75 PEEK SwiveLock anchors, each placed 8 to 10 mm below the tip of the tuberosity and spanning anterior-posterior width of the tear with care to avoid over tensioning.   Distal Clavicle resection:  The scope was placed in the subacromial space from the posterior portal.  A hemostat was placed through the anterior portal and we spread at the [facility] joint.  A burr was then inserted and 10 mm of distal clavicle was resected taking care to avoid damage to the capsule around the joint and avoiding overhanging bone posteriorly.    Biceps tenodesis: We marked the tendon and then performed a tenotomy and debridement of the stump in the articular space. We then identified the biceps tendon in its groove suprapec with the arthroscope in the lateral portal taking care to move from lateral to medial to avoid injury to the subscapularis. At that point we unroofed the tendon itself and mobilized it. An accessory anterior portal was made in line with the tendon and we grasped it from the anterior superior portal and worked from the accessory anterior portal. Two [employer]ertak 1.53m anchors were placed in the groove and the tendon was secured in a luggage loop style fashion with good tension on the tendon.  The incisions were closed with absorbable monocryl, benzoin and steri strips.  A sterile dressing was placed along with a sling. The patient was awoken from general anesthesia  and taken to the PACU in stable condition without complication.

## 2019-04-15 NOTE — Discharge Instructions (Signed)
Post Anesthesia Home Care Instructions  Activity: Get plenty of rest for the remainder of the day. A responsible individual must stay with you for 24 hours following the procedure.  For the next 24 hours, DO NOT: -Drive a car -Operate machinery -Drink alcoholic beverages -Take any medication unless instructed by your physician -Make any legal decisions or sign important papers.  Meals: Start with liquid foods such as gelatin or soup. Progress to regular foods as tolerated. Avoid greasy, spicy, heavy foods. If nausea and/or vomiting occur, drink only clear liquids until the nausea and/or vomiting subsides. Call your physician if vomiting continues.  Special Instructions/Symptoms: Your throat may feel dry or sore from the anesthesia or the breathing tube placed in your throat during surgery. If this causes discomfort, gargle with warm salt water. The discomfort should disappear within 24 hours.  If you had a scopolamine patch placed behind your ear for the management of post- operative nausea and/or vomiting:  1. The medication in the patch is effective for 72 hours, after which it should be removed.  Wrap patch in a tissue and discard in the trash. Wash hands thoroughly with soap and water. 2. You may remove the patch earlier than 72 hours if you experience unpleasant side effects which may include dry mouth, dizziness or visual disturbances. 3. Avoid touching the patch. Wash your hands with soap and water after contact with the patch.      Regional Anesthesia Blocks  1. Numbness or the inability to move the "blocked" extremity may last from 3-48 hours after placement. The length of time depends on the medication injected and your individual response to the medication. If the numbness is not going away after 48 hours, call your surgeon.  2. The extremity that is blocked will need to be protected until the numbness is gone and the  Strength has returned. Because you cannot feel it, you  will need to take extra care to avoid injury. Because it may be weak, you may have difficulty moving it or using it. You may not know what position it is in without looking at it while the block is in effect.  3. For blocks in the legs and feet, returning to weight bearing and walking needs to be done carefully. You will need to wait until the numbness is entirely gone and the strength has returned. You should be able to move your leg and foot normally before you try and bear weight or walk. You will need someone to be with you when you first try to ensure you do not fall and possibly risk injury.  4. Bruising and tenderness at the needle site are common side effects and will resolve in a few days.  5. Persistent numbness or new problems with movement should be communicated to the surgeon or the [facility] ([phone])/ [facility] ([phone]).  Information for Discharge Teaching: EXPAREL (bupivacaine liposome injectable suspension)   Your surgeon or anesthesiologist gave you EXPAREL(bupivacaine) to help control your pain after surgery.   EXPAREL is a local anesthetic that provides pain relief by numbing the tissue around the surgical site.  EXPAREL is designed to release pain medication over time and can control pain for up to 72 hours.  Depending on how you respond to EXPAREL, you may require less pain medication during your recovery.  Possible side effects:  Temporary loss of sensation or ability to move in the area where bupivacaine was injected.  Nausea, vomiting, constipation  Rarely,   numbness and tingling in your mouth or lips, lightheadedness, or anxiety may occur.  Call your doctor right away if you think you may be experiencing any of these sensations, or if you have other questions regarding possible side effects.  Follow all other discharge instructions given to you by your surgeon or nurse. Eat a healthy diet and drink plenty of water or other  fluids.  If you return to the hospital for any reason within 96 hours following the administration of EXPAREL, it is important for health care providers to know that you have received this anesthetic. A teal colored band has been placed on your arm with the date, time and amount of EXPAREL you have received in order to alert and inform your health care providers. Please leave this armband in place for the full 96 hours following administration, and then you may remove the band. 

## 2019-04-15 NOTE — H&P (Signed)
PREOPERATIVE H&P  Chief Complaint: RIGHT SHOULDER CARTILAGE DISORDER, IMPINGEMENT, OSTEOARTHRITIS, ROTATOR CUFF TEAR. BICIPITAL TENDONITITIS  HPI: Donna [name] is a 68 y.o. female who presents for preoperative history and physical with a diagnosis of RIGHT SHOULDER CARTILAGE DISORDER, IMPINGEMENT, OSTEOARTHRITIS, ROTATOR CUFF TEAR. BICIPITAL TENDONITITIS. Symptoms are rated as moderate to severe, and have been worsening.  This is significantly impairing activities of daily living.  Please see my clinic note for full details on this patient's care.  She has elected for surgical management.   Past Medical History:  Diagnosis Date  . Rosacea   . Tachycardia    Past Surgical History:  Procedure Laterality Date  . dilation curretage    . endometrial tumor    . NOSE SURGERY  1960  . PARTIAL HYSTERECTOMY  1979   removed one tube and one ovary   . TONSILLECTOMY  1957   Social History   Socioeconomic History  . Marital status: Married    Spouse name: Not on file  . Number of children: Not on file  . Years of education: Not on file  . Highest education level: Not on file  Occupational History  . Not on file  Social Needs  . Financial resource strain: Not on file  . Food insecurity:    Worry: Not on file    Inability: Not on file  . Transportation needs:    Medical: Not on file    Non-medical: Not on file  Tobacco Use  . Smoking status: Never Smoker  . Smokeless tobacco: Never Used  Substance and Sexual Activity  . Alcohol use: Yes    Alcohol/week: 0.0 standard drinks    Comment: 0-1 drinks per week  . Drug use: No  . Sexual activity: Not Currently    Birth control/protection: Post-menopausal  Lifestyle  . Physical activity:    Days per week: Not on file    Minutes per session: Not on file  . Stress: Not on file  Relationships  . Social connections:    Talks on phone: Not on file    Gets together: Not on file    Attends religious service: Not on file    Active  member of club or organization: Not on file    Attends meetings of clubs or organizations: Not on file    Relationship status: Not on file  Other Topics Concern  . Not on file  Social History Narrative  . Not on file   Family History  Problem Relation Age of Onset  . Diabetes Paternal Uncle   . Breast cancer Sister   . Stroke Paternal Grandmother   . Liver disease Father    Allergies  Allergen Reactions  . Shellfish Allergy Nausea And Vomiting   Prior to Admission medications   Medication Sig Start Date End Date Taking? Authorizing Provider  meloxicam (MOBIC) 15 MG tablet Take 1 tablet (15 mg total) by mouth daily. 08/28/18  Yes Silverio Decamp, MD     Positive ROS: All other systems have been reviewed and were otherwise negative with the exception of those mentioned in the HPI and as above.  Physical Exam: General: Alert, no acute distress Cardiovascular: No pedal edema Respiratory: No cyanosis, no use of accessory musculature GI: No organomegaly, abdomen is soft and non-tender Skin: No lesions in the area of chief complaint Neurologic: Sensation intact distally Psychiatric: Patient is competent for consent with normal mood and affect Lymphatic: No axillary or cervical lymphadenopathy  MUSCULOSKELETAL: R shoulder +AC pain, 4/5 cuff,  NVID, +impingement and obrien  Assessment: RIGHT SHOULDER CARTILAGE DISORDER, IMPINGEMENT, OSTEOARTHRITIS, ROTATOR CUFF TEAR. BICIPITAL TENDONITITIS  Plan: Plan for Procedure(s): RIGHT SHOULDER ARTHROSCOPY WITH ROTATOR CUFF REPAIR AND SUBACROMIAL DECOMPRESSION, DISTAL CLAVICULECTOMY, BICEPS TENODESIS  The risks benefits and alternatives were discussed with the patient including but not limited to the risks of nonoperative treatment, versus surgical intervention including infection, bleeding, nerve injury,  blood clots, cardiopulmonary complications, morbidity, mortality, among others, and they were willing to proceed.   Hiram Gash,  MD  04/15/2019 8:50 AM

## 2019-04-15 NOTE — Anesthesia Procedure Notes (Signed)
Procedure Name: Intubation Date/Time: 04/15/2019 8:30 AM Performed by: Maryella Shivers, CRNA Pre-anesthesia Checklist: Patient identified, Emergency Drugs available, Suction available and Patient being monitored Patient Re-evaluated:Patient Re-evaluated prior to induction Oxygen Delivery Method: Circle system utilized Preoxygenation: Pre-oxygenation with 100% oxygen Induction Type: IV induction and Rapid sequence Laryngoscope Size: Mac and 3 Grade View: Grade III Tube type: Oral Tube size: 7.0 mm Number of attempts: 2 Airway Equipment and Method: Stylet and Oral airway Placement Confirmation: ETT inserted through vocal cords under direct vision,  positive ETCO2 and breath sounds checked- equal and bilateral Secured at: 20 cm Tube secured with: Tape Dental Injury: Teeth and Oropharynx as per pre-operative assessment  Difficulty Due To: Difficult Airway- due to anterior larynx Future Recommendations: Recommend- induction with short-acting agent, and alternative techniques readily available Comments: DL X 1 with MAC #3 with poor visualization, successful 2nd attempt glidescope #3, easy FMV between attempts

## 2019-04-15 NOTE — Progress Notes (Signed)
Assisted Dr. Christella Hartigan with right, ultrasound guided, interscalene  block. Side rails up, monitors on throughout procedure. See vital signs in flow sheet. Tolerated Procedure well.

## 2019-04-15 NOTE — Anesthesia Preprocedure Evaluation (Addendum)
Anesthesia Evaluation  Patient identified by MRN, date of birth, ID band Patient awake    Reviewed: Allergy & Precautions, NPO status , Patient's Chart, lab work & pertinent test results  History of Anesthesia Complications Negative for: history of anesthetic complications  Airway Mallampati: II  TM Distance: >3 FB Neck ROM: Full    Dental no notable dental hx.    Pulmonary neg pulmonary ROS,    Pulmonary exam normal        Cardiovascular negative cardio ROS Normal cardiovascular exam     Neuro/Psych negative neurological ROS  negative psych ROS   GI/Hepatic negative GI ROS, Neg liver ROS,   Endo/Other  negative endocrine ROS  Renal/GU negative Renal ROS  negative genitourinary   Musculoskeletal negative musculoskeletal ROS (+)   Abdominal   Peds  Hematology negative hematology ROS (+)   Anesthesia Other Findings   Reproductive/Obstetrics                            Anesthesia Physical Anesthesia Plan  ASA: II  Anesthesia Plan: General   Post-op Pain Management: GA combined w/ Regional for post-op pain   Induction: Intravenous and Rapid sequence  PONV Risk Score and Plan: 3 and Ondansetron, Dexamethasone, Midazolam and Treatment may vary due to age or medical condition  Airway Management Planned: Oral ETT  Additional Equipment: None  Intra-op Plan:   Post-operative Plan: Extubation in OR  Informed Consent: I have reviewed the patients History and Physical, chart, labs and discussed the procedure including the risks, benefits and alternatives for the proposed anesthesia with the patient or authorized representative who has indicated his/her understanding and acceptance.     Dental advisory given  Plan Discussed with:   Anesthesia Plan Comments:        Anesthesia Quick Evaluation

## 2019-04-16 NOTE — Anesthesia Postprocedure Evaluation (Signed)
Anesthesia Post Note  Patient: Janiah Devinney  Procedure(s) Performed: RIGHT SHOULDER ARTHROSCOPY WITH ROTATOR CUFF REPAIR AND SUBACROMIAL DECOMPRESSION, DISTAL CLAVICULECTOMY, BICEPS TENODESIS (Right Shoulder)     Patient location during evaluation: PACU Anesthesia Type: General Level of consciousness: awake and alert Pain management: pain level controlled Vital Signs Assessment: post-procedure vital signs reviewed and stable Respiratory status: spontaneous breathing, nonlabored ventilation, respiratory function stable and patient connected to nasal cannula oxygen Cardiovascular status: blood pressure returned to baseline and stable Postop Assessment: no apparent nausea or vomiting Anesthetic complications: no    Last Vitals:  Vitals:   04/15/19 1230 04/15/19 1310  BP: (!) 117/51 (!) 143/59  Pulse: 84 88  Resp: 14 18  Temp:  36.7 C  SpO2: 97% 96%    Last Pain:  Vitals:   04/16/19 0933  TempSrc:   PainSc: 0-No pain                 Lidia Collum

## 2019-04-19 ENCOUNTER — Encounter (HOSPITAL_BASED_OUTPATIENT_CLINIC_OR_DEPARTMENT_OTHER): Payer: Self-pay | Admitting: Orthopaedic Surgery

## 2019-04-22 DIAGNOSIS — M19011 Primary osteoarthritis, right shoulder: Secondary | ICD-10-CM | POA: Diagnosis not present

## 2019-05-27 DIAGNOSIS — M19011 Primary osteoarthritis, right shoulder: Secondary | ICD-10-CM | POA: Diagnosis not present

## 2019-05-28 ENCOUNTER — Other Ambulatory Visit: Payer: Self-pay

## 2019-05-28 ENCOUNTER — Encounter: Payer: Self-pay | Admitting: Rehabilitative and Restorative Service Providers"

## 2019-05-28 ENCOUNTER — Ambulatory Visit: Payer: Medicare HMO | Admitting: Rehabilitative and Restorative Service Providers"

## 2019-05-28 DIAGNOSIS — R293 Abnormal posture: Secondary | ICD-10-CM | POA: Diagnosis not present

## 2019-05-28 DIAGNOSIS — M6281 Muscle weakness (generalized): Secondary | ICD-10-CM | POA: Diagnosis not present

## 2019-05-28 DIAGNOSIS — G2589 Other specified extrapyramidal and movement disorders: Secondary | ICD-10-CM | POA: Diagnosis not present

## 2019-05-28 DIAGNOSIS — R29898 Other symptoms and signs involving the musculoskeletal system: Secondary | ICD-10-CM

## 2019-05-28 DIAGNOSIS — M25511 Pain in right shoulder: Secondary | ICD-10-CM | POA: Diagnosis not present

## 2019-05-28 NOTE — Therapy (Signed)
[place], [place], [address] Phone: [phone]   Fax:  [phone]  Physical Therapy Evaluation  Patient Details  Name: Donna [name] MRN: [id number] Date of Birth: Apr 06, 1951 Referring Provider (PT): Dr Ophelia Charter    Encounter Date: 05/28/2019  PT End of Session - 05/28/19 1223    Visit Number  1    Number of Visits  12    Date for PT Re-Evaluation  07/09/19    PT Start Time  0958    PT Stop Time  1105    PT Time Calculation (min)  67 min    Activity Tolerance  Patient tolerated treatment well;Patient limited by pain       Past Medical History:  Diagnosis Date  . Rosacea   . Tachycardia     Past Surgical History:  Procedure Laterality Date  . dilation curretage    . endometrial tumor    . NOSE SURGERY  1960  . PARTIAL HYSTERECTOMY  1979   removed one tube and one ovary   . SHOULDER ARTHROSCOPY WITH ROTATOR CUFF REPAIR AND SUBACROMIAL DECOMPRESSION Right 04/15/2019   Procedure: RIGHT SHOULDER ARTHROSCOPY WITH ROTATOR CUFF REPAIR AND SUBACROMIAL DECOMPRESSION, DISTAL CLAVICULECTOMY, BICEPS TENODESIS;  Surgeon: Hiram Gash, MD;  Location: [facility];  Service: Orthopedics;  Laterality: Right;  . TONSILLECTOMY  1957    There were no vitals filed for this visit.   Subjective Assessment - 05/28/19 1013    Subjective  Patient reports gradual onset of Rt shoulder pain beginning in July 2019. Symptoms continued to worsen - she was treated with injections with no improvement. She underwent elective RCR 04/15/2019.    Pertinent History  arthritis    Diagnostic tests  MRI    Patient Stated Goals  to be able to use arm and shoulder again    Currently in Pain?  Yes    Pain Score  0-No pain   pain with movement up to 5/10 or 6/10   Pain Location  Shoulder    Pain Orientation  Right    Pain Descriptors / Indicators  Sharp;Throbbing    Pain Type  Chronic pain;Surgical pain    Pain Radiating Towards  into the Rt arm    Pain Onset  More than a month ago    Pain Frequency  Intermittent    Aggravating Factors   movement; lying to sleep at night finding a comfortable position    Pain Relieving Factors  change positions         [facility] PT Assessment - 05/28/19 0001      Assessment   Medical Diagnosis  Rt shoulder RCR; DCR; SAD     Referring Provider (PT)  Dr Ophelia Charter     Onset Date/Surgical Date  04/15/19    Hand Dominance  Right    Next MD Visit  07/08/2019    Prior Therapy  here for Lt knee 3 visits 2017      Precautions   Precaution Comments  post op precautions - poor quality tendon reapir       Restrictions   Weight Bearing Restrictions  No      Balance Screen   Has the patient fallen in the past 6 months  No    Has the patient had a decrease in activity level because of a fear of falling?   No    Is the patient reluctant to leave their home because of a fear of  falling?   No      Prior Function   Level of Independence  Independent    Vocation  Retired    Scientist, research (life sciences)     Leisure  household chores; cooking; sewing; reading; volunteer work       Observation/Other Assessments   Focus on Therapeutic Outcomes (FOTO)   64% limitation       Sensation   Additional Comments  WFL's per pt report       Posture/Postural Control   Posture Comments  head forward; shoudlers rounded and elevated; head of the humerus anterior in irientatin; scapulae abducted and rotated along the thoracic wall       AROM   Overall AROM Comments  limited Rt elbow extension with pulling through the arm into the shoulder with elbow extension in standing     Right/Left Shoulder  --   shoulder ROM assessed in standing poor scapular control Rt    Right Shoulder Extension  27 Degrees    Right Shoulder Flexion  35 Degrees    Right Shoulder ABduction  47 Degrees    Right Shoulder Internal Rotation  --   hand to lateral hip    Right Shoulder External  Rotation  10 Degrees   assessed with UE in neutral elbow at 90 deg flexion    Left Shoulder Extension  45 Degrees    Left Shoulder Flexion  133 Degrees    Left Shoulder ABduction  120 Degrees    Left Shoulder Internal Rotation  --   T8    Left Shoulder External Rotation  45 Degrees   arm at side elbow 90 deg flexion      Strength   Overall Strength Comments  Lt UE WFL's Rt not tested       Palpation   Spinal mobility  hypomobilie thoracic and cervical spine with CPA mobs     Palpation comment  muscuar tightness Rt upper quarter - pecs, upper trap, leveator, deltoid, biceps                 Objective measurements completed on examination: See above findings.      [facility] Adult PT Treatment/Exercise - 05/28/19 0001      Neuro Re-ed    Neuro Re-ed Details   working on posture and alignment - encouraging posterior shoulder girdle       Shoulder Exercises: Standing   External Rotation  PROM;Right;5 reps   10 sec hold w/ cane    Other Standing Exercises  axial extension 5 sec x 10; scap squeeze 10 sec x 10 with noodle       Shoulder Exercises: Pulleys   Flexion  --   5 reps x 10 sec (minimal ROM - to pt tolerance)      Shoulder Exercises: ROM/Strengthening   Pendulum  allowing Rt UE to hang toward floor (unable to achieve 90 deg shd flexion) 10 sec x 3 reps       Electrical Stimulation   Electrical Stimulation Location  Rt shoulder girdle     Electrical Stimulation Action  IFC    Electrical Stimulation Parameters  to tolerance     Electrical Stimulation Goals  Pain;Tone      Vasopneumatic   Number Minutes Vasopneumatic   15 minutes    Vasopnuematic Location   Shoulder    Vasopneumatic Pressure  Low    Vasopneumatic Temperature   34 deg  PT Education - 05/28/19 1223    Education Details  HEP TENs POC    Person(s) Educated  Patient    Methods  Explanation;Demonstration;Tactile cues;Verbal cues;Handout    Comprehension  Verbalized  understanding;Returned demonstration;Verbal cues required;Tactile cues required          PT Long Term Goals - 05/28/19 1306      PT LONG TERM GOAL #1   Title  Improve posture and alignment with patient demonstrating improved upright posture engaging posterior shoulder girdle musculature to promote improved shoulder function 07/09/2019    Time  6    Period  Weeks    Status  New      PT LONG TERM GOAL #2   Title  Increase AROM Rt shoulder to 90-100 degrees flexion and abduction; ER in neutral to 30-35 deg 07/09/2019    Time  6    Period  Weeks    Status  New      PT LONG TERM GOAL #3   Title  Patient reports ability use Rt UE functionally for activities like reaching to shoulder height shelf, washing face, driving car with minimal to no difficulty 07/09/2019    Time  6    Period  Weeks    Status  New      PT LONG TERM GOAL #4   Title  07/09/2019    Time  6    Period  Weeks    Status  New      PT LONG TERM GOAL #5   Title  Improve FOTO to </=36% limitation 07/09/2019    Time  6    Period  Weeks    Status  New             Plan - 05/28/19 1224    Clinical Impression Statement  Patient presents s/p Rt RCR, DCR, SAD 04/15/2019. She has a history of gradual onset of Rt shoulder pain for the past 11 months with no known injury. She has poor posture and alignment; limited Rt UE ROM/strength/function; scapular dyskinesis; pain; limited functional activities. She will benefit from PT to address problems identified.    Stability/Clinical Decision Making  Stable/Uncomplicated    Clinical Decision Making  Low    Rehab Potential  Good    PT Frequency  2x / week    PT Duration  6 weeks    PT Treatment/Interventions  Patient/family education;ADLs/Self Care Home Management;Cryotherapy;Electrical Stimulation;Iontophoresis 4mg /ml Dexamethasone;Moist Heat;Ultrasound;Neuromuscular re-education;Manual techniques;Dry needling;Functional mobility training;Therapeutic activities;Therapeutic exercise     PT Next Visit Plan  review HEP; progress with postural correction/education; gentle ROM; manual work and PROM; modalities as indicated    PT Home Exercise Plan  [email or web address]    Consulted and Agree with Plan of Care  Patient       Patient will benefit from skilled therapeutic intervention in order to improve the following deficits and impairments:  Pain, Increased muscle spasms, Increased fascial restricitons, Hypomobility, Decreased mobility, Decreased strength, Decreased range of motion, Decreased activity tolerance, Impaired UE functional use  Visit Diagnosis: 1. Acute pain of right shoulder   2. Scapular dyskinesis   3. Abnormal posture   4. Other symptoms and signs involving the musculoskeletal system   5. Muscle weakness (generalized)        Problem List Patient Active Problem List   Diagnosis Date Noted  . Plantar fasciitis, right 02/19/2019  . Osteopenia 09/02/2018  . Bilateral hearing loss 08/23/2018  . Post-menopausal 08/23/2018  . Tear of right supraspinatus tendon 08/23/2018  .  Overweight (BMI 25.0-29.9) 01/27/2017  . Boil, labium 11/22/2016  . Class 1 obesity due to excess calories without serious comorbidity with body mass index (BMI) of 32.0 to 32.9 in adult 07/26/2016  . Primary osteoarthritis of left knee 06/10/2016  . Laceration 06/10/2016  . Elevated LDL cholesterol level 06/10/2016    Nekayla Heider Nilda Simmer PT, MPH  05/28/2019, 1:33 PM  [facility] [place], [place], [address] Phone: [phone]   Fax:  [phone]  Name: Larin Depaoli MRN: [id number] Date of Birth: [birth year]

## 2019-05-28 NOTE — Patient Instructions (Signed)
Access Code: [email or web address]  URL: [email or web address]  Date: 05/28/2019  Prepared by: Gillermo Murdoch   Exercises  Seated Cervical Retraction - 10 reps - 1 sets - 3x daily - 7x weekly  Standing Scapular Retraction - 10 reps - 1 sets - 10 hold - 3x daily - 7x weekly  Shoulder External Rotation - 5-10 reps - 1 sets - 10 sec hold - 3-4x daily - 7x weekly  Circular Shoulder Pendulum with Table Support - 3-5 reps - 1 sets - 3-4x daily - 7x weekly  Seated Shoulder Flexion AAROM with Pulley Behind - 5-10 reps - 1 sets - 10sec hold - 2x daily - 7x weekly  Patient Education  TENS Unit

## 2019-06-01 ENCOUNTER — Other Ambulatory Visit: Payer: Self-pay

## 2019-06-01 ENCOUNTER — Ambulatory Visit: Payer: Medicare HMO | Admitting: Rehabilitative and Restorative Service Providers"

## 2019-06-01 ENCOUNTER — Encounter: Payer: Self-pay | Admitting: Rehabilitative and Restorative Service Providers"

## 2019-06-01 DIAGNOSIS — R293 Abnormal posture: Secondary | ICD-10-CM | POA: Diagnosis not present

## 2019-06-01 DIAGNOSIS — R29898 Other symptoms and signs involving the musculoskeletal system: Secondary | ICD-10-CM

## 2019-06-01 DIAGNOSIS — M25511 Pain in right shoulder: Secondary | ICD-10-CM

## 2019-06-01 DIAGNOSIS — G2589 Other specified extrapyramidal and movement disorders: Secondary | ICD-10-CM

## 2019-06-01 DIAGNOSIS — M6281 Muscle weakness (generalized): Secondary | ICD-10-CM | POA: Diagnosis not present

## 2019-06-01 NOTE — Therapy (Signed)
[place], [place], [address] Phone: [phone]   Fax:  [phone]  Physical Therapy Treatment  Patient Details  Name: Donna [name] MRN: [id number] Date of Birth: 1951/08/09 Referring Provider (PT): Dr Ophelia Charter    Encounter Date: 06/01/2019  PT End of Session - 06/01/19 1010    Visit Number  2    Number of Visits  12    Date for PT Re-Evaluation  07/09/19    PT Start Time  1007    PT Stop Time  1055    PT Time Calculation (min)  48 min       Past Medical History:  Diagnosis Date  . Rosacea   . Tachycardia     Past Surgical History:  Procedure Laterality Date  . dilation curretage    . endometrial tumor    . NOSE SURGERY  1960  . PARTIAL HYSTERECTOMY  1979   removed one tube and one ovary   . SHOULDER ARTHROSCOPY WITH ROTATOR CUFF REPAIR AND SUBACROMIAL DECOMPRESSION Right 04/15/2019   Procedure: RIGHT SHOULDER ARTHROSCOPY WITH ROTATOR CUFF REPAIR AND SUBACROMIAL DECOMPRESSION, DISTAL CLAVICULECTOMY, BICEPS TENODESIS;  Surgeon: Hiram Gash, MD;  Location: [facility];  Service: Orthopedics;  Laterality: Right;  . TONSILLECTOMY  1957    There were no vitals filed for this visit.  Subjective Assessment - 06/01/19 1011    Subjective  Patient reports that she has been doing some of her exercises at home - has not gotten a pulley yet. Still not sleeping well.    Currently in Pain?  Yes    Pain Score  2     Pain Location  Shoulder    Pain Orientation  Right    Pain Descriptors / Indicators  Sharp;Throbbing    Pain Type  Chronic pain;Surgical pain    Pain Radiating Towards  into Rt arm    Pain Onset  More than a month ago    Pain Frequency  Intermittent                       [facility] Adult PT Treatment/Exercise - 06/01/19 0001      Self-Care   Self-Care  --   education re sitting positions Rt UE supported to side      Neuro Re-ed    Neuro Re-ed  Details   working on posture and alignment - encouraging posterior shoulder girdle       Shoulder Exercises: Standing   External Rotation  PROM;Right;5 reps   10 sec hold w/ cane    Other Standing Exercises  axial extension 5 sec x 10; scap squeeze 10 sec x 10 with noodle       Shoulder Exercises: Pulleys   Flexion  --   10 reps x 10 sec (minimal ROM - to pt tolerance)      Shoulder Exercises: Stretch   Table Stretch - Flexion  5 reps;10 seconds   assisting with Lt UE    Other Shoulder Stretches  cane behind back for stretch into neutral extension - added pulling cane across posterior hips with Lt UE 10 sec x 5       Modalities   Modalities  --   pt declined modalities      Manual Therapy   Manual therapy comments  pt sitting     Joint Mobilization  gentle joint movement     Soft tissue mobilization  soft tissue work  through pecs; upper trap; leveator; deltoid; biceps     Passive ROM  PROM Rt shoulder to pt tolerance shoulder flexion; abduction; ER as tissue extensibility and to pt tolerance              PT Education - 06/01/19 1056    Education Details  HEP    Person(s) Educated  Patient    Methods  Explanation;Demonstration;Tactile cues;Verbal cues;Handout    Comprehension  Verbalized understanding;Returned demonstration;Verbal cues required;Tactile cues required          PT Long Term Goals - 05/28/19 1306      PT LONG TERM GOAL #1   Title  Improve posture and alignment with patient demonstrating improved upright posture engaging posterior shoulder girdle musculature to promote improved shoulder function 07/09/2019    Time  6    Period  Weeks    Status  New      PT LONG TERM GOAL #2   Title  Increase AROM Rt shoulder to 90-100 degrees flexion and abduction; ER in neutral to 30-35 deg 07/09/2019    Time  6    Period  Weeks    Status  New      PT LONG TERM GOAL #3   Title  Patient reports ability use Rt UE functionally for activities like reaching to shoulder  height shelf, washing face, driving car with minimal to no difficulty 07/09/2019    Time  6    Period  Weeks    Status  New      PT LONG TERM GOAL #4   Title  07/09/2019    Time  6    Period  Weeks    Status  New      PT LONG TERM GOAL #5   Title  Improve FOTO to </=36% limitation 07/09/2019    Time  6    Period  Weeks    Status  New            Plan - 06/01/19 1011    Clinical Impression Statement  Limited mobility/ROM; minimal tolerance for ROM exercises. Added exercise and worked on manual work and PROM to pt tolerance.    Stability/Clinical Decision Making  Stable/Uncomplicated    Rehab Potential  Good    PT Frequency  2x / week    PT Duration  6 weeks    PT Treatment/Interventions  Patient/family education;ADLs/Self Care Home Management;Cryotherapy;Electrical Stimulation;Iontophoresis 4mg /ml Dexamethasone;Moist Heat;Ultrasound;Neuromuscular re-education;Manual techniques;Dry needling;Functional mobility training;Therapeutic activities;Therapeutic exercise    PT Next Visit Plan  review HEP; progress with postural correction/education; gentle ROM; manual work and PROM; modalities as indicated    PT Home Exercise Plan  [email or web address]    Consulted and Agree with Plan of Care  Patient       Patient will benefit from skilled therapeutic intervention in order to improve the following deficits and impairments:  Pain, Increased muscle spasms, Increased fascial restricitons, Hypomobility, Decreased mobility, Decreased strength, Decreased range of motion, Decreased activity tolerance, Impaired UE functional use  Visit Diagnosis: 1. Acute pain of right shoulder   2. Scapular dyskinesis   3. Abnormal posture   4. Other symptoms and signs involving the musculoskeletal system   5. Muscle weakness (generalized)        Problem List Patient Active Problem List   Diagnosis Date Noted  . Plantar fasciitis, right 02/19/2019  . Osteopenia 09/02/2018  . Bilateral hearing loss 08/23/2018  .  Post-menopausal 08/23/2018  . Tear of right supraspinatus tendon 08/23/2018  .  Overweight (BMI 25.0-29.9) 01/27/2017  . Boil, labium 11/22/2016  . Class 1 obesity due to excess calories without serious comorbidity with body mass index (BMI) of 32.0 to 32.9 in adult 07/26/2016  . Primary osteoarthritis of left knee 06/10/2016  . Laceration 06/10/2016  . Elevated LDL cholesterol level 06/10/2016    Celyn Nilda Simmer PT, MPH  06/01/2019, 12:35 PM  [facility] [place], [place], [address] Phone: [phone]   Fax:  [phone]  Name: Alfredo Collymore MRN: [id number] Date of Birth: [birth year]

## 2019-06-01 NOTE — Patient Instructions (Addendum)
Access Code: [email or web address]  URL: [email or web address]  Date: 06/01/2019  Prepared by: Gillermo Murdoch   Exercises  Seated Cervical Retraction - 10 reps - 1 sets - 3x daily - 7x weekly  Standing Scapular Retraction - 10 reps - 1 sets - 10 hold - 3x daily - 7x weekly  Shoulder External Rotation - 5-10 reps - 1 sets - 10 sec hold - 3-4x daily - 7x weekly  Circular Shoulder Pendulum with Table Support - 3-5 reps - 1 sets - 3-4x daily - 7x weekly  Seated Shoulder Flexion AAROM with Pulley Behind - 5-10 reps - 1 sets - 10sec hold - 2x daily - 7x weekly   Today  Table slide  Seated Shoulder Flexion Towel Slide at Table Top - 10 reps - 1 sets - 10 sec hold - 3-4x daily - 7x weekly  Standing Shoulder Extension with [place] - 3 reps - 1 sets - 30 sec hold - 3x daily - 7x weekly  Standing Backward Shoulder Rolls - 110-15 reps - 1 sets - 3x daily - 7x weekly  Patient Education  TENS Unit

## 2019-06-07 ENCOUNTER — Encounter: Payer: Self-pay | Admitting: Rehabilitative and Restorative Service Providers"

## 2019-06-07 ENCOUNTER — Other Ambulatory Visit: Payer: Self-pay

## 2019-06-07 ENCOUNTER — Ambulatory Visit: Payer: Medicare HMO | Admitting: Rehabilitative and Restorative Service Providers"

## 2019-06-07 DIAGNOSIS — M6281 Muscle weakness (generalized): Secondary | ICD-10-CM | POA: Diagnosis not present

## 2019-06-07 DIAGNOSIS — M25511 Pain in right shoulder: Secondary | ICD-10-CM | POA: Diagnosis not present

## 2019-06-07 DIAGNOSIS — R29898 Other symptoms and signs involving the musculoskeletal system: Secondary | ICD-10-CM

## 2019-06-07 DIAGNOSIS — G2589 Other specified extrapyramidal and movement disorders: Secondary | ICD-10-CM | POA: Diagnosis not present

## 2019-06-07 DIAGNOSIS — R293 Abnormal posture: Secondary | ICD-10-CM

## 2019-06-07 NOTE — Therapy (Signed)
[place], [place], [address] Phone: [phone]   Fax:  [phone]  Physical Therapy Treatment  Patient Details  Name: Donna [name] MRN: [id number] Date of Birth: 20-Sep-1951 Referring Provider (PT): Dr Ophelia Charter    Encounter Date: 06/07/2019  PT End of Session - 06/07/19 1005    Visit Number  3    Number of Visits  12    Date for PT Re-Evaluation  07/09/19    PT Start Time  1000    PT Stop Time  1049    PT Time Calculation (min)  49 min    Activity Tolerance  Patient tolerated treatment well       Past Medical History:  Diagnosis Date  . Rosacea   . Tachycardia     Past Surgical History:  Procedure Laterality Date  . dilation curretage    . endometrial tumor    . NOSE SURGERY  1960  . PARTIAL HYSTERECTOMY  1979   removed one tube and one ovary   . SHOULDER ARTHROSCOPY WITH ROTATOR CUFF REPAIR AND SUBACROMIAL DECOMPRESSION Right 04/15/2019   Procedure: RIGHT SHOULDER ARTHROSCOPY WITH ROTATOR CUFF REPAIR AND SUBACROMIAL DECOMPRESSION, DISTAL CLAVICULECTOMY, BICEPS TENODESIS;  Surgeon: Hiram Gash, MD;  Location: [facility];  Service: Orthopedics;  Laterality: Right;  . TONSILLECTOMY  1957    There were no vitals filed for this visit.  Subjective Assessment - 06/07/19 1006    Subjective  Patient reports that she has a pulley for home. Needs to adjust the length of the rope. Doiing her exercises at home but still not sleeping well.    Currently in Pain?  Yes    Pain Score  1     Pain Location  Shoulder    Pain Orientation  Right    Pain Descriptors / Indicators  Dull;Nagging    Pain Type  Surgical pain;Chronic pain    Pain Onset  More than a month ago    Pain Frequency  Intermittent         [facility] PT Assessment - 06/07/19 0001      Assessment   Medical Diagnosis  Rt shoulder RCR; DCR; SAD     Referring Provider (PT)  Dr Ophelia Charter     Onset Date/Surgical  Date  04/15/19    Hand Dominance  Right    Next MD Visit  07/08/2019    Prior Therapy  here for Lt knee 3 visits 2017      Precautions   Precaution Comments  post op precautions - poor quality tendon reapir       PROM   Right Shoulder Extension  47 Degrees    Right Shoulder Flexion  90 Degrees    Right Shoulder ABduction  75 Degrees   scaption    Right Shoulder External Rotation  20 Degrees   UE in neutral ~ 30 deg scaption elbow in 90 deg flexion                   [facility] Adult PT Treatment/Exercise - 06/07/19 0001      Shoulder Exercises: Pulleys   Flexion  --   10 reps x 10 sec (minimal ROM - to pt tolerance)    Other Pulley Exercises  bringing arms out and in neutral position ~ 1 min       Shoulder Exercises: Stretch   External Rotation Stretch  5 reps;10 seconds   standing at doorway turning body  to Lt    Table Stretch - Flexion  5 reps;10 seconds   assisting with Lt UE    Other Shoulder Stretches  cane behind back for stretch into neutral extension - added pulling cane across posterior hips with Lt UE 10 sec x 5       Manual Therapy   Manual therapy comments  pt supine     Joint Mobilization  gentle joint movement     Soft tissue mobilization  soft tissue work through Arts development officer; upper trap; leveator; deltoid; biceps     Passive ROM  PROM Rt shoulder to pt tolerance shoulder flexion; abduction; ER; extension as tissue extensibility and to pt tolerance              PT Education - 06/07/19 1058    Education Details  HEP    Person(s) Educated  Patient    Methods  Explanation;Demonstration;Tactile cues;Verbal cues;Handout    Comprehension  Verbalized understanding;Returned demonstration;Verbal cues required;Tactile cues required          PT Long Term Goals - 05/28/19 1306      PT LONG TERM GOAL #1   Title  Improve posture and alignment with patient demonstrating improved upright posture engaging posterior shoulder girdle musculature to promote improved  shoulder function 07/09/2019    Time  6    Period  Weeks    Status  New      PT LONG TERM GOAL #2   Title  Increase AROM Rt shoulder to 90-100 degrees flexion and abduction; ER in neutral to 30-35 deg 07/09/2019    Time  6    Period  Weeks    Status  New      PT LONG TERM GOAL #3   Title  Patient reports ability use Rt UE functionally for activities like reaching to shoulder height shelf, washing face, driving car with minimal to no difficulty 07/09/2019    Time  6    Period  Weeks    Status  New      PT LONG TERM GOAL #4   Title  07/09/2019    Time  6    Period  Weeks    Status  New      PT LONG TERM GOAL #5   Title  Improve FOTO to </=36% limitation 07/09/2019    Time  6    Period  Weeks    Status  New            Plan - 06/07/19 1005    Clinical Impression Statement  Limited toleranc for exercises continues. Patient added ER at doorway with better results than the cane. Added scaption with pulley. Patient continues to self limit with ROM exercises. Fair tolerance for manual work and PROM. Will benefit from continued PT to achieve maximum rehab potential.    Stability/Clinical Decision Making  Stable/Uncomplicated    Rehab Potential  Good    PT Frequency  2x / week    PT Duration  6 weeks    PT Treatment/Interventions  Patient/family education;ADLs/Self Care Home Management;Cryotherapy;Electrical Stimulation;Iontophoresis 4mg /ml Dexamethasone;Moist Heat;Ultrasound;Neuromuscular re-education;Manual techniques;Dry needling;Functional mobility training;Therapeutic activities;Therapeutic exercise    PT Next Visit Plan  review HEP; progress with postural correction/education; gentle ROM; manual work and PROM; modalities as indicated    PT Home Exercise Plan  [email or web address]    Consulted and Agree with Plan of Care  Patient       Patient will benefit from skilled therapeutic intervention in order to improve the following  deficits and impairments:  Pain, Increased muscle spasms, Increased  fascial restricitons, Hypomobility, Decreased mobility, Decreased strength, Decreased range of motion, Decreased activity tolerance, Impaired UE functional use  Visit Diagnosis: 1. Acute pain of right shoulder   2. Scapular dyskinesis   3. Abnormal posture   4. Other symptoms and signs involving the musculoskeletal system   5. Muscle weakness (generalized)        Problem List Patient Active Problem List   Diagnosis Date Noted  . Plantar fasciitis, right 02/19/2019  . Osteopenia 09/02/2018  . Bilateral hearing loss 08/23/2018  . Post-menopausal 08/23/2018  . Tear of right supraspinatus tendon 08/23/2018  . Overweight (BMI 25.0-29.9) 01/27/2017  . Boil, labium 11/22/2016  . Class 1 obesity due to excess calories without serious comorbidity with body mass index (BMI) of 32.0 to 32.9 in adult 07/26/2016  . Primary osteoarthritis of left knee 06/10/2016  . Laceration 06/10/2016  . Elevated LDL cholesterol level 06/10/2016    Shaunita Seney Nilda Simmer PT, MPH  06/07/2019, 11:22 AM  [facility] [place], [place], [address] Phone: [phone]   Fax:  [phone]  Name: Geniva Lohnes MRN: [id number] Date of Birth: [birth year]

## 2019-06-07 NOTE — Patient Instructions (Addendum)
Sitting at pulley hold with both hands and gently bring arms out to side and back in  ~ 1-2 min  Opening chest and pulling arms and shoulders back   Access Code: [email or web address]  URL: [email or web address]  Date: 06/07/2019  Prepared by: Gillermo Murdoch   Exercises  Seated Cervical Retraction - 10 reps - 1 sets - 3x daily - 7x weekly  Standing Scapular Retraction - 10 reps - 1 sets - 10 hold - 3x daily - 7x weekly  Shoulder External Rotation - 5-10 reps - 1 sets - 10 sec hold - 3-4x daily - 7x weekly  Circular Shoulder Pendulum with Table Support - 3-5 reps - 1 sets - 3-4x daily - 7x weekly  Seated Shoulder Flexion AAROM with Pulley Behind - 5-10 reps - 1 sets - 10sec hold - 2x daily - 7x weekly  Seated Shoulder Flexion Towel Slide at Table Top - 10 reps - 1 sets - 10 sec hold - 3-4x daily - 7x weekly  Standing Shoulder Extension with Dowel - 3 reps - 1 sets - 30 sec hold - 3x daily - 7x weekly  Standing Backward Shoulder Rolls - 110-15 reps - 1 sets - 3x daily - 7x weekly  Seated Shoulder Scaption AAROM with Pulley at Side - 5-10 reps - 1 sets - 10 sec hold - 2x daily - 7x weekly  Standing Shoulder External Rotation Stretch in Doorway - 10 reps - 1 sets - 10 sec hold - 2x daily - 7x weekly

## 2019-06-10 ENCOUNTER — Ambulatory Visit: Payer: Medicare HMO | Admitting: Rehabilitative and Restorative Service Providers"

## 2019-06-10 ENCOUNTER — Encounter: Payer: Self-pay | Admitting: Rehabilitative and Restorative Service Providers"

## 2019-06-10 ENCOUNTER — Other Ambulatory Visit: Payer: Self-pay

## 2019-06-10 DIAGNOSIS — G2589 Other specified extrapyramidal and movement disorders: Secondary | ICD-10-CM | POA: Diagnosis not present

## 2019-06-10 DIAGNOSIS — M25511 Pain in right shoulder: Secondary | ICD-10-CM

## 2019-06-10 DIAGNOSIS — M6281 Muscle weakness (generalized): Secondary | ICD-10-CM | POA: Diagnosis not present

## 2019-06-10 DIAGNOSIS — R293 Abnormal posture: Secondary | ICD-10-CM

## 2019-06-10 DIAGNOSIS — R29898 Other symptoms and signs involving the musculoskeletal system: Secondary | ICD-10-CM | POA: Diagnosis not present

## 2019-06-10 NOTE — Patient Instructions (Addendum)
  Access Code: [email or web address]  URL: [email or web address]  Date: 06/10/2019  Prepared by: Gillermo Murdoch   Exercises  Seated Cervical Retraction - 10 reps - 1 sets - 3x daily - 7x weekly  Standing Scapular Retraction - 10 reps - 1 sets - 10 hold - 3x daily - 7x weekly  Shoulder External Rotation - 5-10 reps - 1 sets - 10 sec hold - 3-4x daily - 7x weekly  Circular Shoulder Pendulum with Table Support - 3-5 reps - 1 sets - 3-4x daily - 7x weekly  Seated Shoulder Flexion AAROM with Pulley Behind - 5-10 reps - 1 sets - 10sec hold - 2x daily - 7x weekly  Seated Shoulder Flexion Towel Slide at Table Top - 10 reps - 1 sets - 10 sec hold - 3-4x daily - 7x weekly  Standing Shoulder Extension with Dowel - 3 reps - 1 sets - 30 sec hold - 3x daily - 7x weekly  Standing Backward Shoulder Rolls - 110-15 reps - 1 sets - 3x daily - 7x weekly  Seated Shoulder Scaption AAROM with Pulley at Side - 5-10 reps - 1 sets - 10 sec hold - 2x daily - 7x weekly  Standing Shoulder External Rotation Stretch in Doorway - 10 reps - 1 sets - 10 sec hold - 2x daily - 7x weekly  Isometric Shoulder Abduction at Wall - 10 reps - 1 sets - 10 sec hold - 2x daily - 7x weekly  Isometric Shoulder Extension at Wall - 10 reps - 1 sets - 10 sec hold - 2x daily - 7x weekly  Isometric Shoulder External Rotation with Ball at [employer] - 10 reps - 1 sets - 10 sec hold - 2x daily - 7x weekly  Standing Shoulder and Trunk Flexion at Table - 3 reps - 1 sets - 30 sec hold - 2x daily - 7x weekly  Supine Shoulder Flexion PROM - 10 reps - 1 sets - 10 sec hold - 2x daily - 7x weekly

## 2019-06-10 NOTE — Therapy (Signed)
[place] [place], [place], [address] Phone: [phone]   Fax:  [phone]  Physical Therapy Treatment  Patient Details  Name: Donna [name] MRN: [id number] Date of Birth: 18-Nov-1951 Referring Provider (PT): Dr Ophelia Charter    Encounter Date: 06/10/2019  PT End of Session - 06/10/19 1009    Visit Number  4    Number of Visits  12    Date for PT Re-Evaluation  07/09/19    PT Start Time  1003    PT Stop Time  1049    PT Time Calculation (min)  46 min    Activity Tolerance  Patient tolerated treatment well       Past Medical History:  Diagnosis Date  . Rosacea   . Tachycardia     Past Surgical History:  Procedure Laterality Date  . dilation curretage    . endometrial tumor    . NOSE SURGERY  1960  . PARTIAL HYSTERECTOMY  1979   removed one tube and one ovary   . SHOULDER ARTHROSCOPY WITH ROTATOR CUFF REPAIR AND SUBACROMIAL DECOMPRESSION Right 04/15/2019   Procedure: RIGHT SHOULDER ARTHROSCOPY WITH ROTATOR CUFF REPAIR AND SUBACROMIAL DECOMPRESSION, DISTAL CLAVICULECTOMY, BICEPS TENODESIS;  Surgeon: Hiram Gash, MD;  Location: [facility];  Service: Orthopedics;  Laterality: Right;  . TONSILLECTOMY  1957    There were no vitals filed for this visit.  Subjective Assessment - 06/10/19 1009    Subjective  She is workingon her exercises. She can see some progress. Can now use Rt hand to eat for short periods of time. She can turn ignition now.    Currently in Pain?  Yes    Pain Score  1     Pain Location  Shoulder    Pain Orientation  Right    Pain Descriptors / Indicators  Dull;Nagging    Pain Type  Surgical pain;Chronic pain                       [facility] Adult PT Treatment/Exercise - 06/10/19 0001      Shoulder Exercises: Supine   Flexion  AAROM;PROM;Right;10 reps   pt assisting with Lt UE      Shoulder Exercises: Pulleys   Flexion  --   10 reps x 10 sec  (minimal ROM - to pt tolerance)    Scaption  --   10 sec x 10    Other Pulley Exercises  bringing arms out and in neutral position ~ 1 min       Shoulder Exercises: Therapy Ball   Flexion  Both;10 reps   walking hands forward with ball - minimal mvt    Other Therapy Ball Exercises  scapular depression pressing into large red ball at side 5 sec x 10 reps       Shoulder Exercises: Isometric Strengthening   Extension  5X5"    External Rotation  5X5"    ABduction  5X5"      Shoulder Exercises: Stretch   External Rotation Stretch  5 reps;10 seconds   standing at doorway turning body to Lt    Table Stretch -Flexion Limitations  step back stretch for shoulder flexion hands resting on table 10 sec hold x 5       Manual Therapy   Manual therapy comments  pt supine     Joint Mobilization  gentle joint movement     Soft tissue mobilization  soft tissue work through  pecs; upper trap; leveator; deltoid; biceps     Passive ROM  PROM Rt shoulder to pt tolerance shoulder flexion; abduction; ER; extension as tissue extensibility and to pt tolerance              PT Education - 06/10/19 1036    Education Details  HEP    Person(s) Educated  Patient    Methods  Explanation;Demonstration;Tactile cues;Verbal cues;Handout    Comprehension  Verbalized understanding;Returned demonstration;Verbal cues required;Tactile cues required          PT Long Term Goals - 05/28/19 1306      PT LONG TERM GOAL #1   Title  Improve posture and alignment with patient demonstrating improved upright posture engaging posterior shoulder girdle musculature to promote improved shoulder function 07/09/2019    Time  6    Period  Weeks    Status  New      PT LONG TERM GOAL #2   Title  Increase AROM Rt shoulder to 90-100 degrees flexion and abduction; ER in neutral to 30-35 deg 07/09/2019    Time  6    Period  Weeks    Status  New      PT LONG TERM GOAL #3   Title  Patient reports ability use Rt UE functionally  for activities like reaching to shoulder height shelf, washing face, driving car with minimal to no difficulty 07/09/2019    Time  6    Period  Weeks    Status  New      PT LONG TERM GOAL #4   Title  07/09/2019    Time  6    Period  Weeks    Status  New      PT LONG TERM GOAL #5   Title  Improve FOTO to </=36% limitation 07/09/2019    Time  6    Period  Weeks    Status  New            Plan - 06/10/19 1009    Clinical Impression Statement  Functional improvement noted. Now sleeping some in the bed as well. Continued work on gradually increasing ROM. Added isometric ex in neutral.    Stability/Clinical Decision Making  Stable/Uncomplicated    Rehab Potential  Good    PT Frequency  2x / week    PT Duration  6 weeks    PT Treatment/Interventions  Patient/family education;ADLs/Self Care Home Management;Cryotherapy;Electrical Stimulation;Iontophoresis 4mg /ml Dexamethasone;Moist Heat;Ultrasound;Neuromuscular re-education;Manual techniques;Dry needling;Functional mobility training;Therapeutic activities;Therapeutic exercise    PT Next Visit Plan  review HEP; progress with postural correction/education; gentle ROM; manual work and PROM; modalities as indicated    PT Home Exercise Plan  [email or web address]    Consulted and Agree with Plan of Care  Patient       Patient will benefit from skilled therapeutic intervention in order to improve the following deficits and impairments:  Pain, Increased muscle spasms, Increased fascial restricitons, Hypomobility, Decreased mobility, Decreased strength, Decreased range of motion, Decreased activity tolerance, Impaired UE functional use  Visit Diagnosis: 1. Acute pain of right shoulder   2. Scapular dyskinesis   3. Abnormal posture   4. Other symptoms and signs involving the musculoskeletal system   5. Muscle weakness (generalized)        Problem List Patient Active Problem List   Diagnosis Date Noted  . Plantar fasciitis, right 02/19/2019  .  Osteopenia 09/02/2018  . Bilateral hearing loss 08/23/2018  . Post-menopausal 08/23/2018  . Tear of right supraspinatus tendon  08/23/2018  . Overweight (BMI 25.0-29.9) 01/27/2017  . Boil, labium 11/22/2016  . Class 1 obesity due to excess calories without serious comorbidity with body mass index (BMI) of 32.0 to 32.9 in adult 07/26/2016  . Primary osteoarthritis of left knee 06/10/2016  . Laceration 06/10/2016  . Elevated LDL cholesterol level 06/10/2016    Jarissa Sheriff Nilda Simmer PT, MPH  06/10/2019, 11:02 AM  [facility] [place], [place], [address] Phone: [phone]   Fax:  [phone]  Name: Ashanna Heinsohn MRN: [id number] Date of Birth: Aug 14, 1951

## 2019-06-14 ENCOUNTER — Other Ambulatory Visit: Payer: Self-pay

## 2019-06-14 ENCOUNTER — Ambulatory Visit: Payer: Medicare HMO | Admitting: Physical Therapy

## 2019-06-14 DIAGNOSIS — M25511 Pain in right shoulder: Secondary | ICD-10-CM

## 2019-06-14 DIAGNOSIS — R29898 Other symptoms and signs involving the musculoskeletal system: Secondary | ICD-10-CM | POA: Diagnosis not present

## 2019-06-14 DIAGNOSIS — R293 Abnormal posture: Secondary | ICD-10-CM

## 2019-06-14 DIAGNOSIS — M6281 Muscle weakness (generalized): Secondary | ICD-10-CM | POA: Diagnosis not present

## 2019-06-14 DIAGNOSIS — G2589 Other specified extrapyramidal and movement disorders: Secondary | ICD-10-CM

## 2019-06-14 NOTE — Therapy (Signed)
[place], [place], [address] Phone: [phone]   Fax:  [phone]  Physical Therapy Treatment  Patient Details  Name: Donna [name] MRN: [id number] Date of Birth: 05-07-1951 Referring Provider (PT): Dr Ophelia Charter    Encounter Date: 06/14/2019  PT End of Session - 06/14/19 1053    Visit Number  5    Number of Visits  12    Date for PT Re-Evaluation  07/09/19    PT Start Time  2831    PT Stop Time  1049    PT Time Calculation (min)  47 min    Activity Tolerance  Patient tolerated treatment well    Behavior During Therapy  [facility] for tasks assessed/performed       Past Medical History:  Diagnosis Date  . Rosacea   . Tachycardia     Past Surgical History:  Procedure Laterality Date  . dilation curretage    . endometrial tumor    . NOSE SURGERY  1960  . PARTIAL HYSTERECTOMY  1979   removed one tube and one ovary   . SHOULDER ARTHROSCOPY WITH ROTATOR CUFF REPAIR AND SUBACROMIAL DECOMPRESSION Right 04/15/2019   Procedure: RIGHT SHOULDER ARTHROSCOPY WITH ROTATOR CUFF REPAIR AND SUBACROMIAL DECOMPRESSION, DISTAL CLAVICULECTOMY, BICEPS TENODESIS;  Surgeon: Hiram Gash, MD;  Location: [facility];  Service: Orthopedics;  Laterality: Right;  . TONSILLECTOMY  1957    There were no vitals filed for this visit.  Subjective Assessment - 06/14/19 1004    Subjective  Pt reports she continues to see improvement, like being able to use Rt arm to eat and has attempted to brush teeth.  She reports she is doing her exercises 3x/day.    Diagnostic tests  MRI    Patient Stated Goals  to be able to use arm and shoulder again    Currently in Pain?  No/denies    Pain Score  0-No pain    Pain Location  Shoulder    Pain Orientation  Right         [facility] PT Assessment - 06/14/19 0001      Assessment   Medical Diagnosis  Rt shoulder RCR; DCR; SAD     Referring Provider (PT)  Dr Ophelia Charter      Onset Date/Surgical Date  04/15/19    Hand Dominance  Right    Next MD Visit  07/08/2019    Prior Therapy  here for Lt knee 3 visits 2017      AROM   Right Shoulder Extension  30 Degrees    Right Shoulder Flexion  74 Degrees    Right Shoulder ABduction  74 Degrees      PROM   Right Shoulder Flexion  105 Degrees    Right Shoulder External Rotation  32 Degrees       [facility] Adult PT Treatment/Exercise - 06/14/19 0001      Shoulder Exercises: Supine   External Rotation  AAROM;Right;10 reps   cane   Flexion  AAROM;PROM;Right;10 reps   LUE assisting RUE   Flexion Limitations  trial of press up with cane x 5 reps (limited motion)    Other Supine Exercises  AAROM Lt hand pulling Rt to forehead x 5 reps       Shoulder Exercises: Standing   Internal Rotation  AAROM;Both;10 reps   cane behind back   Extension  AAROM;Both;10 reps   cane     Shoulder Exercises: Pulleys  Flexion  --   10 reps x 10 sec (minimal ROM - to pt tolerance)    Scaption  --   10 sec x 10      Manual Therapy   Joint Mobilization  gentle joint movement     Soft tissue mobilization  soft tissue work through [employer];  deltoid; biceps     Passive ROM  PROM Rt shoulder to pt tolerance shoulder flexion; abduction; ER; extension as tissue extensibility and to pt tolerance                   PT Long Term Goals - 05/28/19 1306      PT LONG TERM GOAL #1   Title  Improve posture and alignment with patient demonstrating improved upright posture engaging posterior shoulder girdle musculature to promote improved shoulder function 07/09/2019    Time  6    Period  Weeks    Status  New      PT LONG TERM GOAL #2   Title  Increase AROM Rt shoulder to 90-100 degrees flexion and abduction; ER in neutral to 30-35 deg 07/09/2019    Time  6    Period  Weeks    Status  New      PT LONG TERM GOAL #3   Title  Patient reports ability use Rt UE functionally for activities like reaching to shoulder height shelf, washing  face, driving car with minimal to no difficulty 07/09/2019    Time  6    Period  Weeks    Status  New      PT LONG TERM GOAL #4   Title  07/09/2019    Time  6    Period  Weeks    Status  New      PT LONG TERM GOAL #5   Title  Improve FOTO to </=36% limitation 07/09/2019    Time  6    Period  Weeks    Status  New            Plan - 06/14/19 1054    Clinical Impression Statement  Some improvement in Rt shoulder passive and AAROM.  Improved tolerance for exercise.  Pt gradually progressing towards goals.    Stability/Clinical Decision Making  Stable/Uncomplicated    Rehab Potential  Good    PT Frequency  2x / week    PT Duration  6 weeks    PT Treatment/Interventions  Patient/family education;ADLs/Self Care Home Management;Cryotherapy;Electrical Stimulation;Iontophoresis 4mg /ml Dexamethasone;Moist Heat;Ultrasound;Neuromuscular re-education;Manual techniques;Dry needling;Functional mobility training;Therapeutic activities;Therapeutic exercise    PT Next Visit Plan  review HEP; progress with postural correction/education; gentle ROM; manual work and PROM; modalities as indicated    PT Home Exercise Plan  [email or web address]    Consulted and Agree with Plan of Care  Patient       Patient will benefit from skilled therapeutic intervention in order to improve the following deficits and impairments:  Pain, Increased muscle spasms, Increased fascial restricitons, Hypomobility, Decreased mobility, Decreased strength, Decreased range of motion, Decreased activity tolerance, Impaired UE functional use  Visit Diagnosis: 1. Acute pain of right shoulder   2. Scapular dyskinesis   3. Abnormal posture   4. Other symptoms and signs involving the musculoskeletal system   5. Muscle weakness (generalized)        Problem List Patient Active Problem List   Diagnosis Date Noted  . Plantar fasciitis, right 02/19/2019  . Osteopenia 09/02/2018  . Bilateral hearing loss 08/23/2018  . Post-menopausal  08/23/2018  . Tear of right supraspinatus tendon 08/23/2018  . Overweight (BMI 25.0-29.9) 01/27/2017  . Boil, labium 11/22/2016  . Class 1 obesity due to excess calories without serious comorbidity with body mass index (BMI) of 32.0 to 32.9 in adult 07/26/2016  . Primary osteoarthritis of left knee 06/10/2016  . Laceration 06/10/2016  . Elevated LDL cholesterol level 06/10/2016   Kerin Perna, PTA 06/14/19 11:00 AM  [place], [place], [address] Phone: [phone]   Fax:  [phone]  Name: Nashalie Sallis MRN: [id number] Date of Birth: 1951/08/02

## 2019-06-17 ENCOUNTER — Ambulatory Visit: Payer: Medicare HMO | Admitting: Physical Therapy

## 2019-06-17 ENCOUNTER — Other Ambulatory Visit: Payer: Self-pay

## 2019-06-17 ENCOUNTER — Encounter: Payer: Self-pay | Admitting: Physical Therapy

## 2019-06-17 DIAGNOSIS — R293 Abnormal posture: Secondary | ICD-10-CM

## 2019-06-17 DIAGNOSIS — M25511 Pain in right shoulder: Secondary | ICD-10-CM

## 2019-06-17 DIAGNOSIS — M6281 Muscle weakness (generalized): Secondary | ICD-10-CM

## 2019-06-17 DIAGNOSIS — R29898 Other symptoms and signs involving the musculoskeletal system: Secondary | ICD-10-CM

## 2019-06-17 DIAGNOSIS — G2589 Other specified extrapyramidal and movement disorders: Secondary | ICD-10-CM

## 2019-06-17 NOTE — Therapy (Signed)
[place], [place], [address] Phone: [phone]   Fax:  [phone]  Physical Therapy Treatment  Patient Details  Name: Donna [name] MRN: [id number] Date of Birth: 1951/09/29 Referring Provider (PT): Dr Ophelia Charter    Encounter Date: 06/17/2019  PT End of Session - 06/17/19 1051    Visit Number  6    Number of Visits  12    Date for PT Re-Evaluation  07/09/19    PT Start Time  1005    PT Stop Time  1051    PT Time Calculation (min)  46 min    Activity Tolerance  Patient tolerated treatment well    Behavior During Therapy  [facility] for tasks assessed/performed       Past Medical History:  Diagnosis Date  . Rosacea   . Tachycardia     Past Surgical History:  Procedure Laterality Date  . dilation curretage    . endometrial tumor    . NOSE SURGERY  1960  . PARTIAL HYSTERECTOMY  1979   removed one tube and one ovary   . SHOULDER ARTHROSCOPY WITH ROTATOR CUFF REPAIR AND SUBACROMIAL DECOMPRESSION Right 04/15/2019   Procedure: RIGHT SHOULDER ARTHROSCOPY WITH ROTATOR CUFF REPAIR AND SUBACROMIAL DECOMPRESSION, DISTAL CLAVICULECTOMY, BICEPS TENODESIS;  Surgeon: Hiram Gash, MD;  Location: [facility];  Service: Orthopedics;  Laterality: Right;  . TONSILLECTOMY  1957    There were no vitals filed for this visit.  Subjective Assessment - 06/17/19 1011    Subjective  [name] reports she can now brush her teeth with her Rt hand, but doesn't like the shaking on the arm that it requires to complete the task.  She plans to sew a handmade hot pack to use on shoulder.    Patient Stated Goals  to be able to use arm and shoulder again    Currently in Pain?  No/denies    Pain Score  0-No pain         [facility] PT Assessment - 06/17/19 0001      Assessment   Medical Diagnosis  Rt shoulder RCR; DCR; SAD     Referring Provider (PT)  Dr Ophelia Charter     Onset Date/Surgical Date  04/15/19    Hand  Dominance  Right    Next MD Visit  07/08/2019    Prior Therapy  here for Lt knee 3 visits 2017      AROM   Right Shoulder External Rotation  20 Degrees   assessed with UE in neutral elbow at 90 deg flexion      [facility] Adult PT Treatment/Exercise - 06/17/19 0001      Shoulder Exercises: Supine   External Rotation  AAROM;Right;10 reps   cane   Flexion  AAROM;Right;10 reps   5 reps with Lt hand assist, 5 reps with cane   Flexion Limitations  bench press with cane x 5 reps (improved ability)    Other Supine Exercises  AAROM Lt hand pulling Rt to forehead x 5 reps       Shoulder Exercises: Standing   Internal Rotation  AAROM;Both;10 reps   cane behind back2 sets   Extension  AAROM;Both;10 reps   cane     Shoulder Exercises: Pulleys   Flexion  --   10 reps x 10 sec    Scaption  --   10 sec x 10      Shoulder Exercises: ROM/Strengthening   Other ROM/Strengthening Exercises  shoulder flexion stretch with hands on red pball rolling it forward x 8 reps of 10 sec holds.       Shoulder Exercises: Stretch   Table Stretch - Flexion  5 reps    Table Stretch - Abduction  5 reps    Table Stretch - External Rotation  5 reps;10 seconds    Other Shoulder Stretches  Rt bicep stretch holding counter x 20 sec x 3 reps       Modalities   Modalities  --   pt declined modalities      Manual Therapy   Manual Therapy  Myofascial release;Soft tissue mobilization    Soft tissue mobilization  Rt bicep    Myofascial Release  Rt bicep              PT Education - 06/17/19 1256    Education Details  HEP (thru epic)    Person(s) Educated  Patient    Methods  Explanation;Handout;Demonstration    Comprehension  Verbalized understanding;Tactile cues required;Verbal cues required;Returned demonstration          PT Long Term Goals - 05/28/19 1306      PT LONG TERM GOAL #1   Title  Improve posture and alignment with patient demonstrating improved upright posture engaging posterior shoulder  girdle musculature to promote improved shoulder function 07/09/2019    Time  6    Period  Weeks    Status  New      PT LONG TERM GOAL #2   Title  Increase AROM Rt shoulder to 90-100 degrees flexion and abduction; ER in neutral to 30-35 deg 07/09/2019    Time  6    Period  Weeks    Status  New      PT LONG TERM GOAL #3   Title  Patient reports ability use Rt UE functionally for activities like reaching to shoulder height shelf, washing face, driving car with minimal to no difficulty 07/09/2019    Time  6    Period  Weeks    Status  New      PT LONG TERM GOAL #4   Title  07/09/2019    Time  6    Period  Weeks    Status  New      PT LONG TERM GOAL #5   Title  Improve FOTO to </=36% limitation 07/09/2019    Time  6    Period  Weeks    Status  New            Plan - 06/17/19 1253    Clinical Impression Statement  Pt reporting improved functional mobility with RUE. She was able to tolerate AAROM exercises a little easier today.  Pt able to complete supine flexion AAROM with cane today, as she could not lift cane to 90 deg last visit without difficulty. Right elbow lacks a few degrees of extension; tight and tender bicep muscle with manual therapy.  Pt gradually progressing towards goals.    Stability/Clinical Decision Making  Stable/Uncomplicated    Rehab Potential  Good    PT Frequency  2x / week    PT Duration  6 weeks    PT Treatment/Interventions  Patient/family education;ADLs/Self Care Home Management;Cryotherapy;Electrical Stimulation;Iontophoresis 4mg /ml Dexamethasone;Moist Heat;Ultrasound;Neuromuscular re-education;Manual techniques;Dry needling;Functional mobility training;Therapeutic activities;Therapeutic exercise    PT Next Visit Plan  progress with postural correction/education; gentle ROM; manual work and PROM; modalities as indicated    PT Home Exercise Plan  [email or web address]    Consulted and  Agree with Plan of Care  Patient       Patient will benefit from skilled therapeutic  intervention in order to improve the following deficits and impairments:  Pain, Increased muscle spasms, Increased fascial restricitons, Hypomobility, Decreased mobility, Decreased strength, Decreased range of motion, Decreased activity tolerance, Impaired UE functional use  Visit Diagnosis: 1. Acute pain of right shoulder   2. Scapular dyskinesis   3. Abnormal posture   4. Other symptoms and signs involving the musculoskeletal system   5. Muscle weakness (generalized)        Problem List Patient Active Problem List   Diagnosis Date Noted  . Plantar fasciitis, right 02/19/2019  . Osteopenia 09/02/2018  . Bilateral hearing loss 08/23/2018  . Post-menopausal 08/23/2018  . Tear of right supraspinatus tendon 08/23/2018  . Overweight (BMI 25.0-29.9) 01/27/2017  . Boil, labium 11/22/2016  . Class 1 obesity due to excess calories without serious comorbidity with body mass index (BMI) of 32.0 to 32.9 in adult 07/26/2016  . Primary osteoarthritis of left knee 06/10/2016  . Laceration 06/10/2016  . Elevated LDL cholesterol level 06/10/2016   Kerin Perna, PTA 06/17/19 1:01 PM  [facility] [place], [place], [address] Phone: [phone]   Fax:  [phone]  Name: Donna [name] MRN: [id number] Date of Birth: [birth year]

## 2019-06-17 NOTE — Patient Instructions (Signed)
Cane Exercise: Extension / Internal Rotation    Stand holding cane behind back with both hands palm-up. Slide cane up spine toward head.  Repeat _10___ times. Do _1-2___ sessions per day.  External Rotation (Passive)    With elbow bent and forearm on table, palm down, bend forward at waist until a stretch is felt. Hold __15__ seconds. Repeat __5__ times. Do _1___ sessions per day.  Cane Exercise: Flexion    Lie on back, holding cane above chest. Keeping arms as straight as possible, lower cane toward floor beyond head. Hold __10__ seconds. Repeat __10__ times. Do _1_ sessions per day.  Elbow Press    Interlace fingers; bring hands underneath head. Press elbows down. Hold _10__ seconds. Relax arms. Repeat _5__ times.   [facility] [place], [place] [address]  [phone] (office) [phone] (fax)

## 2019-06-21 ENCOUNTER — Other Ambulatory Visit: Payer: Self-pay

## 2019-06-21 ENCOUNTER — Encounter: Payer: Self-pay | Admitting: Rehabilitative and Restorative Service Providers"

## 2019-06-21 ENCOUNTER — Ambulatory Visit (INDEPENDENT_AMBULATORY_CARE_PROVIDER_SITE_OTHER): Payer: Medicare HMO | Admitting: Rehabilitative and Restorative Service Providers"

## 2019-06-21 DIAGNOSIS — M25511 Pain in right shoulder: Secondary | ICD-10-CM

## 2019-06-21 DIAGNOSIS — R293 Abnormal posture: Secondary | ICD-10-CM | POA: Diagnosis not present

## 2019-06-21 DIAGNOSIS — R29898 Other symptoms and signs involving the musculoskeletal system: Secondary | ICD-10-CM

## 2019-06-21 DIAGNOSIS — M6281 Muscle weakness (generalized): Secondary | ICD-10-CM

## 2019-06-21 DIAGNOSIS — G2589 Other specified extrapyramidal and movement disorders: Secondary | ICD-10-CM | POA: Diagnosis not present

## 2019-06-21 NOTE — Therapy (Signed)
[place], [place], [address] Phone: [phone]   Fax:  [phone]  Physical Therapy Treatment  Patient Details  Name: Donna [name] MRN: [id number] Date of Birth: [birth year] Referring Provider (PT): Dr Ophelia Charter    Encounter Date: 06/21/2019  PT End of Session - 06/21/19 1007    Visit Number  7    Number of Visits  12    Date for PT Re-Evaluation  07/09/19    PT Start Time  5003    PT Stop Time  1051    PT Time Calculation (min)  49 min    Activity Tolerance  Patient tolerated treatment well       Past Medical History:  Diagnosis Date  . Rosacea   . Tachycardia     Past Surgical History:  Procedure Laterality Date  . dilation curretage    . endometrial tumor    . NOSE SURGERY  1960  . PARTIAL HYSTERECTOMY  1979   removed one tube and one ovary   . SHOULDER ARTHROSCOPY WITH ROTATOR CUFF REPAIR AND SUBACROMIAL DECOMPRESSION Right 04/15/2019   Procedure: RIGHT SHOULDER ARTHROSCOPY WITH ROTATOR CUFF REPAIR AND SUBACROMIAL DECOMPRESSION, DISTAL CLAVICULECTOMY, BICEPS TENODESIS;  Surgeon: Hiram Gash, MD;  Location: [place];  Service: Orthopedics;  Laterality: Right;  . TONSILLECTOMY  1957    There were no vitals filed for this visit.  Subjective Assessment - 06/21/19 1008    Subjective  Patient reports that her shoulder is doing "OK"  Using Rt hand to bursh teeth and can get her hand higher.    Currently in Pain?  No/denies                       [facility] Adult PT Treatment/Exercise - 06/21/19 0001      Shoulder Exercises: Seated   Other Seated Exercises  using wooden stick for active ROM/ strengthening - one hand for pressing out and in straight and slightly abducted; bilat UE's for shd/elbow flexion/extension; press outs; turning w/ stick away from body ~ 10-15 reps each movement       Shoulder Exercises: Standing   Retraction  AROM;Both;10 reps    scap squeeze with noodle    Other Standing Exercises  scaption - AROM standing with noodle elevation to ~ 80 deg with focus on control of posterior shoulder girdle x 10 reps       Shoulder Exercises: Pulleys   Flexion  --   10 reps x 10 sec    Scaption  --   10 sec x 10      Shoulder Exercises: Therapy Ball   Other Therapy Ball Exercises  holding small ball btn hands for work through the elbow and shoulder with elbow/shoulder flexioin/extension; press out; press up; diagonals; ball on wall fust below shoulder level elbow bent for up/down/side to side/diagonals       Shoulder Exercises: Stretch   Other Shoulder Stretches  hands on counter step back for shoulder flexion stretch 10 sec x 10     External Rotation Stretch Limitations  hands behind head with support for Rt elbow 20-30 sec stretch x 3 reps       Manual Therapy   Manual therapy comments  pt supine     Joint Mobilization  gentle GH movement     Soft tissue mobilization  soft tissue work for Rt shoulder girdle working through the Advertising copywriter trap  Passive ROM  PROM Rt shoulder to pt tolerance shoulder flexion; abduction; ER; extension; horizontal abduction as tolerated              PT Education - 06/21/19 1038    Education Details  HEP    Person(s) Educated  Patient    Methods  Explanation;Demonstration;Tactile cues;Verbal cues;Handout    Comprehension  Verbalized understanding;Returned demonstration;Verbal cues required;Tactile cues required          PT Long Term Goals - 05/28/19 1306      PT LONG TERM GOAL #1   Title  Improve posture and alignment with patient demonstrating improved upright posture engaging posterior shoulder girdle musculature to promote improved shoulder function 07/09/2019    Time  6    Period  Weeks    Status  New      PT LONG TERM GOAL #2   Title  Increase AROM Rt shoulder to 90-100 degrees flexion and abduction; ER in neutral to 30-35 deg 07/09/2019     Time  6    Period  Weeks    Status  New      PT LONG TERM GOAL #3   Title  Patient reports ability use Rt UE functionally for activities like reaching to shoulder height shelf, washing face, driving car with minimal to no difficulty 07/09/2019    Time  6    Period  Weeks    Status  New      PT LONG TERM GOAL #4   Title  07/09/2019    Time  6    Period  Weeks    Status  New      PT LONG TERM GOAL #5   Title  Improve FOTO to </=36% limitation 07/09/2019    Time  6    Period  Weeks    Status  New            Plan - 06/21/19 1008    Clinical Impression Statement  Continued improvement in functional activities using Rt UE for simple tasks. Added scapular stabilization exercises with AAROM activities in clinic today. Continued to work on manual work including PROM Rt shoulder in all planes. Gradual progress with scapular control and PROM.    Stability/Clinical Decision Making  Stable/Uncomplicated    Rehab Potential  Good    PT Frequency  2x / week    PT Duration  6 weeks    PT Treatment/Interventions  Patient/family education;ADLs/Self Care Home Management;Cryotherapy;Electrical Stimulation;Iontophoresis 4mg /ml Dexamethasone;Moist Heat;Ultrasound;Neuromuscular re-education;Manual techniques;Dry needling;Functional mobility training;Therapeutic activities;Therapeutic exercise    PT Next Visit Plan  progress with postural correction/education; ROM; manual work and PROM; modalities as indicated - focus on scapular stabilization/manual work/PROM    PT Home Exercise Plan  [email or web address]    Consulted and Agree with Plan of Care  Patient       Patient will benefit from skilled therapeutic intervention in order to improve the following deficits and impairments:  Pain, Increased muscle spasms, Increased fascial restricitons, Hypomobility, Decreased mobility, Decreased strength, Decreased range of motion, Decreased activity tolerance, Impaired UE functional use  Visit Diagnosis: 1. Acute pain of  right shoulder   2. Scapular dyskinesis   3. Abnormal posture   4. Other symptoms and signs involving the musculoskeletal system   5. Muscle weakness (generalized)        Problem List Patient Active Problem List   Diagnosis Date Noted  . Plantar fasciitis, right 02/19/2019  . Osteopenia 09/02/2018  . Bilateral hearing loss 08/23/2018  .  Post-menopausal 08/23/2018  . Tear of right supraspinatus tendon 08/23/2018  . Overweight (BMI 25.0-29.9) 01/27/2017  . Boil, labium 11/22/2016  . Class 1 obesity due to excess calories without serious comorbidity with body mass index (BMI) of 32.0 to 32.9 in adult 07/26/2016  . Primary osteoarthritis of left knee 06/10/2016  . Laceration 06/10/2016  . Elevated LDL cholesterol level 06/10/2016    Vee Bahe Nilda Simmer PT, MPH  06/21/2019, 12:09 PM  [facility] [place], [place], [address] Phone: [phone]   Fax:  [phone]  Name: Donna [name] MRN: [id number] Date of Birth: 01-02-1951

## 2019-06-21 NOTE — Patient Instructions (Addendum)
Sitting or standing  - - - - keep shoulder blades down and back   Can use stick or ball   - roll along thigh  -bend elbows up and down  -press ball out away from chest and pull back   -wood chopping   - hold ball at chest and look around ball to each side   - press ball up over head   -bring ball back as if to throw ball      Ball on wall standing elbow bent initially then straighter as it gets easier     Prolonged snow angel   Hands behind head

## 2019-06-24 ENCOUNTER — Other Ambulatory Visit: Payer: Self-pay

## 2019-06-24 ENCOUNTER — Ambulatory Visit (INDEPENDENT_AMBULATORY_CARE_PROVIDER_SITE_OTHER): Payer: Medicare HMO | Admitting: Physical Therapy

## 2019-06-24 DIAGNOSIS — M25511 Pain in right shoulder: Secondary | ICD-10-CM | POA: Diagnosis not present

## 2019-06-24 DIAGNOSIS — G2589 Other specified extrapyramidal and movement disorders: Secondary | ICD-10-CM

## 2019-06-24 DIAGNOSIS — R29898 Other symptoms and signs involving the musculoskeletal system: Secondary | ICD-10-CM

## 2019-06-24 DIAGNOSIS — R293 Abnormal posture: Secondary | ICD-10-CM | POA: Diagnosis not present

## 2019-06-24 NOTE — Therapy (Signed)
[place], [place], [address] Phone: [phone]   Fax:  [phone]  Physical Therapy Treatment  Patient Details  Name: Donna [name] MRN: [id number] Date of Birth: [birth year] Referring Provider (PT): Dr Ophelia Charter    Encounter Date: 06/24/2019  PT End of Session - 06/24/19 1117    Visit Number  8    Number of Visits  12    Date for PT Re-Evaluation  07/09/19    PT Start Time  1003    PT Stop Time  1050    PT Time Calculation (min)  47 min    Activity Tolerance  Patient tolerated treatment well    Behavior During Therapy  [facility] for tasks assessed/performed       Past Medical History:  Diagnosis Date  . Rosacea   . Tachycardia     Past Surgical History:  Procedure Laterality Date  . dilation curretage    . endometrial tumor    . NOSE SURGERY  1960  . PARTIAL HYSTERECTOMY  1979   removed one tube and one ovary   . SHOULDER ARTHROSCOPY WITH ROTATOR CUFF REPAIR AND SUBACROMIAL DECOMPRESSION Right 04/15/2019   Procedure: RIGHT SHOULDER ARTHROSCOPY WITH ROTATOR CUFF REPAIR AND SUBACROMIAL DECOMPRESSION, DISTAL CLAVICULECTOMY, BICEPS TENODESIS;  Surgeon: Hiram Gash, MD;  Location: [place];  Service: Orthopedics;  Laterality: Right;  . TONSILLECTOMY  1957    There were no vitals filed for this visit.  Subjective Assessment - 06/24/19 1122    Subjective  Pt reports she is confused at which exercises she should focus on at home.  She has brought all her print outs for clarification.    Patient Stated Goals  to be able to use arm and shoulder again    Currently in Pain?  No/denies    Pain Score  0-No pain         [facility] PT Assessment - 06/24/19 0001      PROM   Right Shoulder Flexion  127 Degrees   supine with cane   Right Shoulder External Rotation  50 Degrees   shoulder in scaption ~50 deg       [facility] Adult PT Treatment/Exercise - 06/24/19 0001      Shoulder  Exercises: Supine   Flexion  AAROM;Right;5 reps   cane   Other Supine Exercises  AAROM Lt hand pulling Rt to top of head x 3 reps, then elbow press x 3 reps        Shoulder Exercises: Standing   Internal Rotation  AAROM;Both;10 reps   cane behind back   Extension  AAROM;Both;10 reps   cane   Other Standing Exercises  wall ladder with Rt shoulder in flexion x 5 reps, scaption x 5 reps       Shoulder Exercises: Pulleys   Flexion  --   10 reps x 10 sec    Scaption  --   10 sec x 10      Shoulder Exercises: Therapy Ball   Other Therapy Ball Exercises  holding small ball btn hands for work through the elbow and shoulder with elbow/shoulder flexioin/extension; press out; press up to eye level; diagonals;       Modalities   Modalities  --   pt declined modalities      Manual Therapy   Manual therapy comments  pt supine     Joint Mobilization  gentle GH movement     Soft tissue mobilization  soft tissue work for Rt shoulder girdle working through the pecs/anterior shoulder/deltoid/biceps/upper trap     Myofascial Release  Rt bicep     Passive ROM  PROM Rt shoulder to pt tolerance shoulder flexion; abduction; ER; extension; horizontal abduction as tolerated                   PT Long Term Goals - 05/28/19 1306      PT LONG TERM GOAL #1   Title  Improve posture and alignment with patient demonstrating improved upright posture engaging posterior shoulder girdle musculature to promote improved shoulder function 07/09/2019    Time  6    Period  Weeks    Status  New      PT LONG TERM GOAL #2   Title  Increase AROM Rt shoulder to 90-100 degrees flexion and abduction; ER in neutral to 30-35 deg 07/09/2019    Time  6    Period  Weeks    Status  New      PT LONG TERM GOAL #3   Title  Patient reports ability use Rt UE functionally for activities like reaching to shoulder height shelf, washing face, driving car with minimal to no difficulty 07/09/2019    Time  6    Period  Weeks     Status  New      PT LONG TERM GOAL #4   Title  07/09/2019    Time  6    Period  Weeks    Status  New      PT LONG TERM GOAL #5   Title  Improve FOTO to </=36% limitation 07/09/2019    Time  6    Period  Weeks    Status  New            Plan - 06/24/19 1113    Clinical Impression Statement  Continued improvement in PROM of Rt shoulder.  Time spent organizing and discussing HEP, including freq and duration of exercise.  Pt gradually progressing towards therapy goals.    Stability/Clinical Decision Making  Stable/Uncomplicated    Rehab Potential  Good    PT Frequency  2x / week    PT Duration  6 weeks    PT Treatment/Interventions  Patient/family education;ADLs/Self Care Home Management;Cryotherapy;Electrical Stimulation;Iontophoresis 4mg /ml Dexamethasone;Moist Heat;Ultrasound;Neuromuscular re-education;Manual techniques;Dry needling;Functional mobility training;Therapeutic activities;Therapeutic exercise    PT Next Visit Plan  progress with postural correction/education; ROM; manual work and PROM; modalities as indicated - focus on scapular stabilization/manual work/PROM    PT Home Exercise Plan  [email or web address]    Consulted and Agree with Plan of Care  Patient       Patient will benefit from skilled therapeutic intervention in order to improve the following deficits and impairments:  Pain, Increased muscle spasms, Increased fascial restricitons, Hypomobility, Decreased mobility, Decreased strength, Decreased range of motion, Decreased activity tolerance, Impaired UE functional use  Visit Diagnosis: 1. Acute pain of right shoulder   2. Scapular dyskinesis   3. Abnormal posture   4. Other symptoms and signs involving the musculoskeletal system        Problem List Patient Active Problem List   Diagnosis Date Noted  . Plantar fasciitis, right 02/19/2019  . Osteopenia 09/02/2018  . Bilateral hearing loss 08/23/2018  . Post-menopausal 08/23/2018  . Tear of right supraspinatus  tendon 08/23/2018  . Overweight (BMI 25.0-29.9) 01/27/2017  . Boil, labium 11/22/2016  . Class 1 obesity due to excess calories without serious comorbidity with body mass index (  BMI) of 32.0 to 32.9 in adult 07/26/2016  . Primary osteoarthritis of left knee 06/10/2016  . Laceration 06/10/2016  . Elevated LDL cholesterol level 06/10/2016   Kerin Perna, PTA 06/24/19 11:24 AM  [place], [place], [address] Phone: [phone]   Fax:  [phone]  Name: Haylen Bellotti MRN: [id number] Date of Birth: [birth year]

## 2019-06-28 ENCOUNTER — Other Ambulatory Visit: Payer: Self-pay

## 2019-06-28 ENCOUNTER — Encounter: Payer: Self-pay | Admitting: Rehabilitative and Restorative Service Providers"

## 2019-06-28 ENCOUNTER — Ambulatory Visit: Payer: Medicare HMO | Admitting: Rehabilitative and Restorative Service Providers"

## 2019-06-28 DIAGNOSIS — G2589 Other specified extrapyramidal and movement disorders: Secondary | ICD-10-CM

## 2019-06-28 DIAGNOSIS — M25511 Pain in right shoulder: Secondary | ICD-10-CM

## 2019-06-28 DIAGNOSIS — R293 Abnormal posture: Secondary | ICD-10-CM

## 2019-06-28 DIAGNOSIS — R29898 Other symptoms and signs involving the musculoskeletal system: Secondary | ICD-10-CM

## 2019-06-28 DIAGNOSIS — M6281 Muscle weakness (generalized): Secondary | ICD-10-CM

## 2019-06-28 NOTE — Therapy (Signed)
[place], [place], [address] Phone: [phone]   Fax:  [phone]  Physical Therapy Treatment  Patient Details  Name: Donna [name] MRN: [id number] Date of Birth: 1951-07-14 Referring Provider (PT): Dr Ophelia Charter    Encounter Date: 06/28/2019  PT End of Session - 06/28/19 1004    Visit Number  9    Number of Visits  12    Date for PT Re-Evaluation  07/09/19    PT Start Time  1003    PT Stop Time  1051    PT Time Calculation (min)  48 min    Activity Tolerance  Patient tolerated treatment well       Past Medical History:  Diagnosis Date  . Rosacea   . Tachycardia     Past Surgical History:  Procedure Laterality Date  . dilation curretage    . endometrial tumor    . NOSE SURGERY  1960  . PARTIAL HYSTERECTOMY  1979   removed one tube and one ovary   . SHOULDER ARTHROSCOPY WITH ROTATOR CUFF REPAIR AND SUBACROMIAL DECOMPRESSION Right 04/15/2019   Procedure: RIGHT SHOULDER ARTHROSCOPY WITH ROTATOR CUFF REPAIR AND SUBACROMIAL DECOMPRESSION, DISTAL CLAVICULECTOMY, BICEPS TENODESIS;  Surgeon: Hiram Gash, MD;  Location: [place];  Service: Orthopedics;  Laterality: Right;  . TONSILLECTOMY  1957    There were no vitals filed for this visit.  Subjective Assessment - 06/28/19 1007    Subjective  Has been noticing the shoulder hurting a little more lately. Not sure if she is using the arm more now.    Currently in Pain?  Yes    Pain Score  1     Pain Location  Shoulder    Pain Orientation  Right    Pain Descriptors / Indicators  Dull;Nagging    Pain Type  Surgical pain;Chronic pain         [facility] PT Assessment - 06/28/19 0001      Assessment   Medical Diagnosis  Rt shoulder RCR; DCR; SAD     Referring Provider (PT)  Dr Ophelia Charter     Onset Date/Surgical Date  04/15/19    Hand Dominance  Right    Next MD Visit  07/08/2019    Prior Therapy  here for Lt knee 3 visits  2017      PROM   Overall PROM Comments  PROM improving in all planes     Right Shoulder Extension  40 Degrees   supine      Palpation   Palpation comment  muscuar tightness Rt upper quarter - pecs, upper trap, leveator, deltoid, biceps                    [facility] Adult PT Treatment/Exercise - 06/28/19 0001      Self-Care   Self-Care  --   myofacial ball release work standing      Neuro Re-ed    Neuro Re-ed Details   working on posture and alignment - encouraging posterior shoulder girdle       Shoulder Exercises: Standing   Extension  Both;10 reps;Strengthening;Theraband   cane   Theraband Level (Shoulder Extension)  Level 2 (Red)    Row  Strengthening;Both;20 reps;Theraband    Theraband Level (Shoulder Row)  Level 2 (Red)      Shoulder Exercises: Pulleys   Flexion  --   10 reps x 10 sec    Scaption  --   10  sec x 10    Other Pulley Exercises  bringing arms out and in neutral position ~ 1 min       Shoulder Exercises: Isometric Strengthening   External Rotation Limitations  stepping to Rt with yellow TB Rt hand - elbow at 90 deg flexion       Shoulder Exercises: Stretch   Other Shoulder Stretches  supine prolonged snow angel ~ 1 min shoulder ~ 60 deg abduction       Manual Therapy   Manual therapy comments  pt supine     Joint Mobilization  [place] movement     Soft tissue mobilization  soft tissue work for Rt shoulder girdle working through the pecs/anterior shoulder/deltoid/biceps/upper trap     Myofascial Release  anterior chest - pecs     Passive ROM  PROM Rt shoulder flexion; abduction; ER; extension; horizontal abduction; extension; horizontal abduction as tolerated  by tissue extensibility and pt tolerance              PT Education - 06/28/19 1031    Education Details  HEP    Person(s) Educated  Patient    Methods  Explanation;Demonstration;Tactile cues;Verbal cues;Handout    Comprehension  Verbalized understanding;Returned demonstration;Verbal  cues required;Tactile cues required          PT Long Term Goals - 05/28/19 1306      PT LONG TERM GOAL #1   Title  Improve posture and alignment with patient demonstrating improved upright posture engaging posterior shoulder girdle musculature to promote improved shoulder function 07/09/2019    Time  6    Period  Weeks    Status  New      PT LONG TERM GOAL #2   Title  Increase AROM Rt shoulder to 90-100 degrees flexion and abduction; ER in neutral to 30-35 deg 07/09/2019    Time  6    Period  Weeks    Status  New      PT LONG TERM GOAL #3   Title  Patient reports ability use Rt UE functionally for activities like reaching to shoulder height shelf, washing face, driving car with minimal to no difficulty 07/09/2019    Time  6    Period  Weeks    Status  New      PT LONG TERM GOAL #4   Title  07/09/2019    Time  6    Period  Weeks    Status  New      PT LONG TERM GOAL #5   Title  Improve FOTO to </=36% limitation 07/09/2019    Time  6    Period  Weeks    Status  New            Plan - 06/28/19 1005    Clinical Impression Statement  Progressing with posterior shoulder girdle strengthening and gentle RC strengthening. Tolerated exercises well. Discussed sleeping positions to address morning hand stiffness.    Stability/Clinical Decision Making  Stable/Uncomplicated    Rehab Potential  Good    PT Frequency  2x / week    PT Duration  6 weeks    PT Treatment/Interventions  Patient/family education;ADLs/Self Care Home Management;Cryotherapy;Electrical Stimulation;Iontophoresis 4mg /ml Dexamethasone;Moist Heat;Ultrasound;Neuromuscular re-education;Manual techniques;Dry needling;Functional mobility training;Therapeutic activities;Therapeutic exercise    PT Next Visit Plan  progress with postural correction/education; ROM; manual work and PROM; modalities as indicated - focus on scapular stabilization/manual work/PROM    PT Home Exercise Plan  [email or web address]    Consulted and Agree with  Plan  of Care  Patient       Patient will benefit from skilled therapeutic intervention in order to improve the following deficits and impairments:  Pain, Increased muscle spasms, Increased fascial restricitons, Hypomobility, Decreased mobility, Decreased strength, Decreased range of motion, Decreased activity tolerance, Impaired UE functional use  Visit Diagnosis: 1. Acute pain of right shoulder   2. Scapular dyskinesis   3. Abnormal posture   4. Other symptoms and signs involving the musculoskeletal system   5. Muscle weakness (generalized)        Problem List Patient Active Problem List   Diagnosis Date Noted  . Plantar fasciitis, right 02/19/2019  . Osteopenia 09/02/2018  . Bilateral hearing loss 08/23/2018  . Post-menopausal 08/23/2018  . Tear of right supraspinatus tendon 08/23/2018  . Overweight (BMI 25.0-29.9) 01/27/2017  . Boil, labium 11/22/2016  . Class 1 obesity due to excess calories without serious comorbidity with body mass index (BMI) of 32.0 to 32.9 in adult 07/26/2016  . Primary osteoarthritis of left knee 06/10/2016  . Laceration 06/10/2016  . Elevated LDL cholesterol level 06/10/2016    Autrey Human Nilda Simmer PT, MPH  06/28/2019, 11:11 AM  [facility] [place], [place], [address] Phone: [phone]   Fax:  [phone]  Name: Donna [name] MRN: [id number] Date of Birth: 07/13/1951

## 2019-06-28 NOTE — Patient Instructions (Addendum)
  SUPINE Tips A    Being in the supine position means to be lying on the back. Lying on the back is the position of least compression on the bones and discs of the spine, and helps to re-align the natural curves of the back. Keep arms relaxed - hold 2-5 min bend elbows as needed to release stretch     Access Code: [email or web address]  URL: [email or web address]  Date: 06/28/2019  Prepared by: Gillermo Murdoch   Exercises  Seated Cervical Retraction - 10 reps - 1 sets - 3x daily - 7x weekly  Standing Scapular Retraction - 10 reps - 1 sets - 10 hold - 3x daily - 7x weekly  Shoulder External Rotation - 5-10 reps - 1 sets - 10 sec hold - 3-4x daily - 7x weekly  Circular Shoulder Pendulum with Table Support - 3-5 reps - 1 sets - 3-4x daily - 7x weekly  Seated Shoulder Flexion AAROM with Pulley Behind - 5-10 reps - 1 sets - 10sec hold - 2x daily - 7x weekly  Seated Shoulder Flexion Towel Slide at Table Top - 10 reps - 1 sets - 10 sec hold - 3-4x daily - 7x weekly  Standing Shoulder Extension with [place] - 3 reps - 1 sets - 30 sec hold - 3x daily - 7x weekly  Standing Backward Shoulder Rolls - 110-15 reps - 1 sets - 3x daily - 7x weekly  Seated Shoulder Scaption AAROM with Pulley at Side - 5-10 reps - 1 sets - 10 sec hold - 2x daily - 7x weekly  Standing Shoulder External Rotation Stretch in Doorway - 10 reps - 1 sets - 10 sec hold - 2x daily - 7x weekly  Isometric Shoulder Abduction at Wall - 10 reps - 1 sets - 10 sec hold - 2x daily - 7x weekly  Isometric Shoulder Extension at Wall - 10 reps - 1 sets - 10 sec hold - 2x daily - 7x weekly  Isometric Shoulder External Rotation with Ball at [employer] - 10 reps - 1 sets - 10 sec hold - 2x daily - 7x weekly  Standing Shoulder and Trunk Flexion at Table - 3 reps - 1 sets - 30 sec hold - 2x daily - 7x weekly  Supine Shoulder Flexion PROM - 10 reps - 1 sets - 10 sec hold - 2x daily - 7x weekly   Today Scapular Retraction with Resistance - 10 reps - 2-3 sets  - 1x daily - 7x weekly  Scapular Retraction with Resistance Advanced - 10 reps - 2-3 sets - 1x daily - 7x weekly  Shoulder External Rotation Reactive Isometrics - 10 reps - 2-3 sets - 2-3sec hold - 1x daily - 7x weekly

## 2019-07-01 ENCOUNTER — Other Ambulatory Visit: Payer: Self-pay

## 2019-07-01 ENCOUNTER — Encounter: Payer: Self-pay | Admitting: Rehabilitative and Restorative Service Providers"

## 2019-07-01 ENCOUNTER — Ambulatory Visit (INDEPENDENT_AMBULATORY_CARE_PROVIDER_SITE_OTHER): Payer: Medicare HMO | Admitting: Rehabilitative and Restorative Service Providers"

## 2019-07-01 DIAGNOSIS — M6281 Muscle weakness (generalized): Secondary | ICD-10-CM

## 2019-07-01 DIAGNOSIS — R293 Abnormal posture: Secondary | ICD-10-CM

## 2019-07-01 DIAGNOSIS — G2589 Other specified extrapyramidal and movement disorders: Secondary | ICD-10-CM | POA: Diagnosis not present

## 2019-07-01 DIAGNOSIS — R29898 Other symptoms and signs involving the musculoskeletal system: Secondary | ICD-10-CM | POA: Diagnosis not present

## 2019-07-01 DIAGNOSIS — M25511 Pain in right shoulder: Secondary | ICD-10-CM | POA: Diagnosis not present

## 2019-07-01 NOTE — Patient Instructions (Signed)
Access Code: [email or web address]  URL: [email or web address]  Date: 07/01/2019  Prepared by: Gillermo Murdoch   Exercises  Seated Cervical Retraction - 10 reps - 1 sets - 3x daily - 7x weekly  Standing Scapular Retraction - 10 reps - 1 sets - 10 hold - 3x daily - 7x weekly  Shoulder External Rotation - 5-10 reps - 1 sets - 10 sec hold - 3-4x daily - 7x weekly  Circular Shoulder Pendulum with Table Support - 3-5 reps - 1 sets - 3-4x daily - 7x weekly  Seated Shoulder Flexion AAROM with Pulley Behind - 5-10 reps - 1 sets - 10sec hold - 2x daily - 7x weekly  Seated Shoulder Flexion Towel Slide at Table Top - 10 reps - 1 sets - 10 sec hold - 3-4x daily - 7x weekly  Standing Shoulder Extension with Dowel - 3 reps - 1 sets - 30 sec hold - 3x daily - 7x weekly  Standing Backward Shoulder Rolls - 110-15 reps - 1 sets - 3x daily - 7x weekly  Seated Shoulder Scaption AAROM with Pulley at Side - 5-10 reps - 1 sets - 10 sec hold - 2x daily - 7x weekly  Standing Shoulder External Rotation Stretch in Doorway - 10 reps - 1 sets - 10 sec hold - 2x daily - 7x weekly  Isometric Shoulder Abduction at Wall - 10 reps - 1 sets - 10 sec hold - 2x daily - 7x weekly  Isometric Shoulder Extension at Wall - 10 reps - 1 sets - 10 sec hold - 2x daily - 7x weekly  Isometric Shoulder External Rotation with Ball at [employer] - 10 reps - 1 sets - 10 sec hold - 2x daily - 7x weekly  Standing Shoulder and Trunk Flexion at Table - 3 reps - 1 sets - 30 sec hold - 2x daily - 7x weekly  Supine Shoulder Flexion PROM - 10 reps - 1 sets - 10 sec hold - 2x daily - 7x weekly  Scapular Retraction with Resistance - 10 reps - 2-3 sets - 1x daily - 7x weekly  Scapular Retraction with Resistance Advanced - 10 reps - 2-3 sets - 1x daily - 7x weekly  Shoulder External Rotation Reactive Isometrics - 10 reps - 2-3 sets - 2-3sec hold - 1x daily - 7x weekly  Standing Shoulder Scaption - 10 reps - 2-3 sets - 2 sec hold - 2x daily - 7x weekly  Standing  Shoulder Flexion to 90 Degrees - 10 reps - 2-3 sets - 2 sec hold - 2x daily - 7x weekly  Sidelying Shoulder External Rotation - 10 reps - 1 sets - 2 sec hold - 2-3x daily - 7x weekly

## 2019-07-01 NOTE — Therapy (Addendum)
[place] [address] [place], [place], [address] Phone: [phone]   Fax:  [phone]  Physical Therapy Treatment  Patient Details  Name: Donna [name] MRN: [id number] Date of Birth: 07-16-1951 Referring Provider (PT): Dr Ophelia Charter   Progress Note Reporting Period 05/28/2019 to 07/01/2019  See note below for Objective Data and Assessment of Progress/Goals.       Encounter Date: 07/01/2019  PT End of Session - 07/01/19 1007    Visit Number  10    Number of Visits  12    PT Start Time  1004    PT Stop Time  1052    PT Time Calculation (min)  48 min    Activity Tolerance  Patient tolerated treatment well       Past Medical History:  Diagnosis Date  . Rosacea   . Tachycardia     Past Surgical History:  Procedure Laterality Date  . dilation curretage    . endometrial tumor    . NOSE SURGERY  1960  . PARTIAL HYSTERECTOMY  1979   removed one tube and one ovary   . SHOULDER ARTHROSCOPY WITH ROTATOR CUFF REPAIR AND SUBACROMIAL DECOMPRESSION Right 04/15/2019   Procedure: RIGHT SHOULDER ARTHROSCOPY WITH ROTATOR CUFF REPAIR AND SUBACROMIAL DECOMPRESSION, DISTAL CLAVICULECTOMY, BICEPS TENODESIS;  Surgeon: Hiram Gash, MD;  Location: [facility];  Service: Orthopedics;  Laterality: Right;  . TONSILLECTOMY  1957    There were no vitals filed for this visit.  Subjective Assessment - 07/01/19 1008    Subjective  Shoulder is OK    Currently in Pain?  No/denies         [facility] PT Assessment - 07/01/19 0001      Assessment   Medical Diagnosis  Rt shoulder RCR; DCR; SAD     Referring Provider (PT)  Dr Ophelia Charter     Onset Date/Surgical Date  04/15/19    Hand Dominance  Right    Next MD Visit  07/08/2019    Prior Therapy  here for Lt knee 3 visits 2017      PROM   Overall PROM Comments  PROM assessed in supine     Right Shoulder Extension  50 Degrees    Right Shoulder Flexion  143 Degrees    Right Shoulder ABduction  131 Degrees    Right Shoulder Internal Rotation  61 Degrees    Right Shoulder External Rotation  53 Degrees      Palpation   Palpation comment  muscuar tightness Rt upper quarter - pecs, upper trap, leveator, deltoid, biceps                    [facility] Adult PT Treatment/Exercise - 07/01/19 0001      Shoulder Exercises: Sidelying   External Rotation  AROM;Strengthening;Right;20 reps    ABduction  AROM;Strengthening;Right;20 reps   partial range elbow flexed at 90 deg      Shoulder Exercises: Standing   Flexion Limitations  sliding Rt hand up inclined table focus on scapular control 10 reps x 2 sets     ABduction  AROM;Strengthening;Both;20 reps   scaption w/ noodle along spine   Extension  Both;10 reps;Strengthening;Theraband   cane   Theraband Level (Shoulder Extension)  Level 2 (Red)    Row  Strengthening;Both;20 reps;Theraband    Theraband Level (Shoulder Row)  Level 2 (Red)      Shoulder Exercises: Pulleys   Flexion  --   10 reps  x 10 sec    Scaption  --   10 sec x 10    Other Pulley Exercises  bringing arms out and in neutral position ~ 1 min       Shoulder Exercises: Therapy Ball   Other Therapy Ball Exercises  pressing into large red ball - scapular depression 5 sec x 10 x 2 sets       Shoulder Exercises: Isometric Strengthening   External Rotation Limitations  stepping to Rt with yellow TB Rt hand - elbow at 90 deg flexion       Manual Therapy   Manual therapy comments  pt supine and sidelying     Joint Mobilization  Speed movement     Soft tissue mobilization  soft tissue work for Rt shoulder girdle working through the pecs/anterior shoulder/deltoid/biceps/upper trap; teres     Myofascial Release  anterior chest - pecs     Passive ROM  PROM Rt shoulder flexion; scaption; ER; extension; horizontal abduction; extension; horizontal abduction as tolerated  by tissue extensibility and pt tolerance              PT Education -  07/01/19 1033    Education Details  HEP    Person(s) Educated  Patient    Methods  Explanation;Demonstration;Tactile cues;Verbal cues;Handout    Comprehension  Verbalized understanding;Returned demonstration;Verbal cues required;Tactile cues required          PT Long Term Goals - 05/28/19 1306      PT LONG TERM GOAL #1   Title  Improve posture and alignment with patient demonstrating improved upright posture engaging posterior shoulder girdle musculature to promote improved shoulder function 07/09/2019    Time  6    Period  Weeks    Status  New      PT LONG TERM GOAL #2   Title  Increase AROM Rt shoulder to 90-100 degrees flexion and abduction; ER in neutral to 30-35 deg 07/09/2019    Time  6    Period  Weeks    Status  New      PT LONG TERM GOAL #3   Title  Patient reports ability use Rt UE functionally for activities like reaching to shoulder height shelf, washing face, driving car with minimal to no difficulty 07/09/2019    Time  6    Period  Weeks    Status  New      PT LONG TERM GOAL #4   Title  07/09/2019    Time  6    Period  Weeks    Status  New      PT LONG TERM GOAL #5   Title  Improve FOTO to </=36% limitation 07/09/2019    Time  6    Period  Weeks    Status  New            Plan - 07/01/19 1007    Clinical Impression Statement  Continues to progress gradually with ROM and posterior shoulder girdle strengthening. Added active exercises without difficulty. To MD next week Thursday 07/08/2019    Stability/Clinical Decision Making  Stable/Uncomplicated    Rehab Potential  Good    PT Frequency  2x / week    PT Duration  6 weeks    PT Treatment/Interventions  Patient/family education;ADLs/Self Care Home Management;Cryotherapy;Electrical Stimulation;Iontophoresis 4mg /ml Dexamethasone;Moist Heat;Ultrasound;Neuromuscular re-education;Manual techniques;Dry needling;Functional mobility training;Therapeutic activities;Therapeutic exercise    PT Next Visit Plan  progress  with postural correction/education; ROM; manual work and PROM; modalities as indicated -  focus on scapular stabilization/manual work/PROM    PT Home Exercise Plan  [email or web address]    Consulted and Agree with Plan of Care  Patient       Patient will benefit from skilled therapeutic intervention in order to improve the following deficits and impairments:  Pain, Increased muscle spasms, Increased fascial restricitons, Hypomobility, Decreased mobility, Decreased strength, Decreased range of motion, Decreased activity tolerance, Impaired UE functional use  Visit Diagnosis: 1. Acute pain of right shoulder   2. Scapular dyskinesis   3. Abnormal posture   4. Other symptoms and signs involving the musculoskeletal system   5. Muscle weakness (generalized)        Problem List Patient Active Problem List   Diagnosis Date Noted  . Plantar fasciitis, right 02/19/2019  . Osteopenia 09/02/2018  . Bilateral hearing loss 08/23/2018  . Post-menopausal 08/23/2018  . Tear of right supraspinatus tendon 08/23/2018  . Overweight (BMI 25.0-29.9) 01/27/2017  . Boil, labium 11/22/2016  . Class 1 obesity due to excess calories without serious comorbidity with body mass index (BMI) of 32.0 to 32.9 in adult 07/26/2016  . Primary osteoarthritis of left knee 06/10/2016  . Laceration 06/10/2016  . Elevated LDL cholesterol level 06/10/2016    Felicie Kocher Nilda Simmer PT, MPH  07/01/2019, 11:06 AM  [facility] [place], [place], [address] Phone: [phone]   Fax:  [phone]  Name: Consuela Widener MRN: [id number] Date of Birth: 1951-01-25

## 2019-07-05 ENCOUNTER — Encounter: Payer: Self-pay | Admitting: Rehabilitative and Restorative Service Providers"

## 2019-07-05 ENCOUNTER — Other Ambulatory Visit: Payer: Self-pay

## 2019-07-05 ENCOUNTER — Ambulatory Visit (INDEPENDENT_AMBULATORY_CARE_PROVIDER_SITE_OTHER): Payer: Medicare HMO | Admitting: Rehabilitative and Restorative Service Providers"

## 2019-07-05 DIAGNOSIS — R29898 Other symptoms and signs involving the musculoskeletal system: Secondary | ICD-10-CM | POA: Diagnosis not present

## 2019-07-05 DIAGNOSIS — R293 Abnormal posture: Secondary | ICD-10-CM

## 2019-07-05 DIAGNOSIS — M6281 Muscle weakness (generalized): Secondary | ICD-10-CM | POA: Diagnosis not present

## 2019-07-05 DIAGNOSIS — G2589 Other specified extrapyramidal and movement disorders: Secondary | ICD-10-CM

## 2019-07-05 DIAGNOSIS — M25511 Pain in right shoulder: Secondary | ICD-10-CM

## 2019-07-05 NOTE — Therapy (Signed)
[place], [place], [address] Phone: [phone]   Fax:  [phone]  Physical Therapy Treatment  Patient Details  Name: Donna [name] MRN: [id number] Date of Birth: 1951/08/20 Referring Provider (PT): Dr Ophelia Charter    Encounter Date: 07/05/2019  PT End of Session - 07/05/19 1104    Visit Number  11    Number of Visits  12    Date for PT Re-Evaluation  07/09/19    PT Start Time  1100    PT Stop Time  1145    PT Time Calculation (min)  45 min    Activity Tolerance  Patient tolerated treatment well       Past Medical History:  Diagnosis Date  . Rosacea   . Tachycardia     Past Surgical History:  Procedure Laterality Date  . dilation curretage    . endometrial tumor    . NOSE SURGERY  1960  . PARTIAL HYSTERECTOMY  1979   removed one tube and one ovary   . SHOULDER ARTHROSCOPY WITH ROTATOR CUFF REPAIR AND SUBACROMIAL DECOMPRESSION Right 04/15/2019   Procedure: RIGHT SHOULDER ARTHROSCOPY WITH ROTATOR CUFF REPAIR AND SUBACROMIAL DECOMPRESSION, DISTAL CLAVICULECTOMY, BICEPS TENODESIS;  Surgeon: Hiram Gash, MD;  Location: [facility];  Service: Orthopedics;  Laterality: Right;  . TONSILLECTOMY  1957    There were no vitals filed for this visit.  Subjective Assessment - 07/05/19 1105    Subjective  Increased pain in the Rt shoulder for a day and a half after last visit. Not sure if it was the new exercises or the stretching. Some better now.    Currently in Pain?  No/denies                       [facility] Adult PT Treatment/Exercise - 07/05/19 0001      Shoulder Exercises: Sidelying   External Rotation  AROM;Strengthening;Right;20 reps    ABduction  AROM;Strengthening;Right;20 reps   partial range elbow flexed at 90 deg      Shoulder Exercises: Standing   ABduction  AROM;Strengthening;Both;20 reps   scaption w/ noodle along spine   Extension  Both;10  reps;Strengthening;Theraband   cane   Theraband Level (Shoulder Extension)  Level 2 (Red)    Row  Strengthening;Both;20 reps;Theraband    Theraband Level (Shoulder Row)  Level 2 (Red)      Shoulder Exercises: Pulleys   Flexion  --   10 reps x 10 sec    Scaption  --   10 sec x 10    Other Pulley Exercises  bringing arms out and in neutral position ~ 1 min       Shoulder Exercises: Therapy Ball   Other Therapy Ball Exercises  pressing into large red ball - scapular depression 5 sec x 10 x 2 sets       Shoulder Exercises: Isometric Strengthening   External Rotation Limitations  stepping to Rt with yellow TB Rt hand - elbow at 90 deg flexion       Manual Therapy   Manual therapy comments  pt supine     Joint Mobilization  GH movement     Soft tissue mobilization  soft tissue work for Rt shoulder girdle working through the pecs/anterior shoulder/deltoid/biceps/upper trap; teres     Myofascial Release  anterior chest - pecs     Passive ROM  PROM Rt shoulder flexion; scaption; ER; extension; horizontal abduction; extension; horizontal abduction  as tolerated  by tissue extensibility and pt tolerance                   PT Long Term Goals - 05/28/19 1306      PT LONG TERM GOAL #1   Title  Improve posture and alignment with patient demonstrating improved upright posture engaging posterior shoulder girdle musculature to promote improved shoulder function 07/09/2019    Time  6    Period  Weeks    Status  New      PT LONG TERM GOAL #2   Title  Increase AROM Rt shoulder to 90-100 degrees flexion and abduction; ER in neutral to 30-35 deg 07/09/2019    Time  6    Period  Weeks    Status  New      PT LONG TERM GOAL #3   Title  Patient reports ability use Rt UE functionally for activities like reaching to shoulder height shelf, washing face, driving car with minimal to no difficulty 07/09/2019    Time  6    Period  Weeks    Status  New      PT LONG TERM GOAL #4   Title  07/09/2019     Time  6    Period  Weeks    Status  New      PT LONG TERM GOAL #5   Title  Improve FOTO to </=36% limitation 07/09/2019    Time  6    Period  Weeks    Status  New            Plan - 07/05/19 1105    Clinical Impression Statement  Some increased discomfort or pain following last visit for 1-2 days resolved now. Reviewed all exercises for proper techniques and corrected as indicated. Continued with manual work and [place]. Note to MD at next visit.    Stability/Clinical Decision Making  Stable/Uncomplicated    Rehab Potential  Good    PT Frequency  2x / week    PT Duration  6 weeks    PT Treatment/Interventions  Patient/family education;ADLs/Self Care Home Management;Cryotherapy;Electrical Stimulation;Iontophoresis 4mg /ml Dexamethasone;Moist Heat;Ultrasound;Neuromuscular re-education;Manual techniques;Dry needling;Functional mobility training;Therapeutic activities;Therapeutic exercise    PT Next Visit Plan  progress with postural correction/education; ROM; manual work and PROM; modalities as indicated - focus on scapular stabilization/manual work/PROM - measurements for MD note next visit.    PT Home Exercise Plan  [email or web address]    Consulted and Agree with Plan of Care  Patient       Patient will benefit from skilled therapeutic intervention in order to improve the following deficits and impairments:  Pain, Increased muscle spasms, Increased fascial restricitons, Hypomobility, Decreased mobility, Decreased strength, Decreased range of motion, Decreased activity tolerance, Impaired UE functional use  Visit Diagnosis: 1. Acute pain of right shoulder   2. Scapular dyskinesis   3. Abnormal posture   4. Other symptoms and signs involving the musculoskeletal system   5. Muscle weakness (generalized)        Problem List Patient Active Problem List   Diagnosis Date Noted  . Plantar fasciitis, right 02/19/2019  . Osteopenia 09/02/2018  . Bilateral hearing loss 08/23/2018  .  Post-menopausal 08/23/2018  . Tear of right supraspinatus tendon 08/23/2018  . Overweight (BMI 25.0-29.9) 01/27/2017  . Boil, labium 11/22/2016  . Class 1 obesity due to excess calories without serious comorbidity with body mass index (BMI) of 32.0 to 32.9 in adult 07/26/2016  . Primary osteoarthritis of left  knee 06/10/2016  . Laceration 06/10/2016  . Elevated LDL cholesterol level 06/10/2016    Yamina Lenis Nilda Simmer PT, MPH  07/05/2019, 11:50 AM  [facility] [place], [place], [address] Phone: [phone]   Fax:  [phone]  Name: Donna [name] MRN: [id number] Date of Birth: 11-24-1951

## 2019-07-08 ENCOUNTER — Ambulatory Visit (INDEPENDENT_AMBULATORY_CARE_PROVIDER_SITE_OTHER): Payer: Medicare HMO | Admitting: Rehabilitative and Restorative Service Providers"

## 2019-07-08 ENCOUNTER — Encounter: Payer: Self-pay | Admitting: Rehabilitative and Restorative Service Providers"

## 2019-07-08 ENCOUNTER — Other Ambulatory Visit: Payer: Self-pay

## 2019-07-08 DIAGNOSIS — M19011 Primary osteoarthritis, right shoulder: Secondary | ICD-10-CM | POA: Diagnosis not present

## 2019-07-08 DIAGNOSIS — G2589 Other specified extrapyramidal and movement disorders: Secondary | ICD-10-CM

## 2019-07-08 DIAGNOSIS — M6281 Muscle weakness (generalized): Secondary | ICD-10-CM

## 2019-07-08 DIAGNOSIS — R29898 Other symptoms and signs involving the musculoskeletal system: Secondary | ICD-10-CM | POA: Diagnosis not present

## 2019-07-08 DIAGNOSIS — M25511 Pain in right shoulder: Secondary | ICD-10-CM | POA: Diagnosis not present

## 2019-07-08 DIAGNOSIS — R293 Abnormal posture: Secondary | ICD-10-CM

## 2019-07-08 NOTE — Therapy (Signed)
[place], [place], [address] Phone: [phone]   Fax:  [phone]  Physical Therapy Treatment  Patient Details  Name: Donna [name] MRN: [id number] Date of Birth: Nov 09, 1951 Referring Provider (PT): Dr Ophelia Charter    Encounter Date: 07/08/2019  PT End of Session - 07/08/19 1024    Visit Number  12    Number of Visits  24    Date for PT Re-Evaluation  08/19/19    PT Start Time  1016    PT Stop Time  1101    PT Time Calculation (min)  45 min    Activity Tolerance  Patient tolerated treatment well       Past Medical History:  Diagnosis Date  . Rosacea   . Tachycardia     Past Surgical History:  Procedure Laterality Date  . dilation curretage    . endometrial tumor    . NOSE SURGERY  1960  . PARTIAL HYSTERECTOMY  1979   removed one tube and one ovary   . SHOULDER ARTHROSCOPY WITH ROTATOR CUFF REPAIR AND SUBACROMIAL DECOMPRESSION Right 04/15/2019   Procedure: RIGHT SHOULDER ARTHROSCOPY WITH ROTATOR CUFF REPAIR AND SUBACROMIAL DECOMPRESSION, DISTAL CLAVICULECTOMY, BICEPS TENODESIS;  Surgeon: Hiram Gash, MD;  Location: [facility];  Service: Orthopedics;  Laterality: Right;  . TONSILLECTOMY  1957    There were no vitals filed for this visit.      [facility] PT Assessment - 07/08/19 0001      Assessment   Medical Diagnosis  Rt shoulder RCR; DCR; SAD     Referring Provider (PT)  Dr Ophelia Charter     Onset Date/Surgical Date  04/15/19    Hand Dominance  Right    Next MD Visit  07/08/2019    Prior Therapy  here for Lt knee 3 visits 2017      AROM   Overall AROM Comments  AROM assessed with pt in standing     Right Shoulder Extension  45 Degrees    Right Shoulder Flexion  110 Degrees    Right Shoulder ABduction  103 Degrees    Right Shoulder Internal Rotation  --   hand to sacrum    Right Shoulder External Rotation  29 Degrees    Left Shoulder Extension  45 Degrees    Left  Shoulder Flexion  133 Degrees    Left Shoulder ABduction  120 Degrees    Left Shoulder External Rotation  45 Degrees   arm at side elbow 90 deg flexion      PROM   Overall PROM Comments  PROM assessed in supine     Right Shoulder Extension  50 Degrees    Right Shoulder Flexion  143 Degrees    Right Shoulder ABduction  131 Degrees    Right Shoulder Internal Rotation  61 Degrees    Right Shoulder External Rotation  53 Degrees      Palpation   Palpation comment  muscuar tightness Rt upper quarter - pecs, upper trap, leveator, deltoid, biceps                    [facility] Adult PT Treatment/Exercise - 07/08/19 0001      Shoulder Exercises: Sidelying   External Rotation  AROM;Strengthening;Right;20 reps   no resistance    ABduction  AROM;Strengthening;Right;20 reps   partial range elbow flexed at 90 deg      Shoulder Exercises: Standing   ABduction  AROM;Strengthening;Both;20 reps  scaption w/ noodle along spine   Extension  Both;10 reps;Strengthening;Theraband   cane   Theraband Level (Shoulder Extension)  Level 2 (Red)    Row  Strengthening;Both;20 reps;Theraband    Theraband Level (Shoulder Row)  Level 2 (Red)      Shoulder Exercises: Pulleys   Flexion  --   10 reps x 10 sec    Scaption  --   10 sec x 10    Other Pulley Exercises  bringing arms out and in neutral position ~ 1 min       Shoulder Exercises: Therapy Ball   Other Therapy Ball Exercises  pressing into large red ball - scapular depression 5 sec x 10 x 2 sets       Shoulder Exercises: Isometric Strengthening   External Rotation Limitations  stepping to Rt with yellow TB Rt hand - elbow at 90 deg flexion       Manual Therapy   Manual therapy comments  pt supine     Joint Mobilization  GH movement     Soft tissue mobilization  soft tissue work for Rt shoulder girdle working through the pecs/anterior shoulder/deltoid/biceps/upper trap; teres     Myofascial Release  anterior chest - pecs     Passive ROM   PROM Rt shoulder flexion; scaption; ER; extension; horizontal abduction; extension; horizontal abduction as tolerated  by tissue extensibility and pt tolerance                   PT Long Term Goals - 07/08/19 1107      PT LONG TERM GOAL #1   Title  Improve posture and alignment with patient demonstrating improved upright posture engaging posterior shoulder girdle musculature to promote improved shoulder function    Time  12    Period  Weeks    Status  Revised    Target Date  08/19/19      PT LONG TERM GOAL #2   Title  Increase AROM Rt shoulder to 110-120 degrees flexion and abduction; ER in neutral to 30-35 deg    Time  12    Period  Weeks    Status  Revised    Target Date  08/19/19      PT LONG TERM GOAL #3   Time  12    Period  Weeks    Status  Revised      PT LONG TERM GOAL #4   Title  Independent in HEP    Time  12    Status  Revised    Target Date  08/19/19      PT LONG TERM GOAL #5   Title  Improve FOTO to </=36% limitation    Time  12    Period  Weeks    Status  Revised    Target Date  08/19/19            Plan - 07/08/19 1104    Clinical Impression Statement  Patient continues to progress well with post rotator cuff rehab. She demonstrates good gains in AROM and PROM and has started with active exercise and light resistive exercies for posterior shoulder girdle. Patient will benefit from continued PT to address ROM; strength; function of Rt UE.    Stability/Clinical Decision Making  Stable/Uncomplicated    Rehab Potential  Good    PT Frequency  2x / week    PT Duration  6 weeks    PT Treatment/Interventions  Patient/family education;ADLs/Self Care Home Management;Cryotherapy;Electrical Stimulation;Iontophoresis 4mg /ml Dexamethasone;Moist  Heat;Ultrasound;Neuromuscular re-education;Manual techniques;Dry needling;Functional mobility training;Therapeutic activities;Therapeutic exercise    PT Next Visit Plan  progress with postural  correction/education; ROM; manual work and PROM; modalities as indicated - focus on scapular stabilization/manual work/PROM - measurements for MD note next visit.    PT Home Exercise Plan  [email or web address]    Consulted and Agree with Plan of Care  Patient       Patient will benefit from skilled therapeutic intervention in order to improve the following deficits and impairments:  Pain, Increased muscle spasms, Increased fascial restricitons, Hypomobility, Decreased mobility, Decreased strength, Decreased range of motion, Decreased activity tolerance, Impaired UE functional use  Visit Diagnosis: 1. Acute pain of right shoulder   2. Scapular dyskinesis   3. Abnormal posture   4. Other symptoms and signs involving the musculoskeletal system   5. Muscle weakness (generalized)        Problem List Patient Active Problem List   Diagnosis Date Noted  . Plantar fasciitis, right 02/19/2019  . Osteopenia 09/02/2018  . Bilateral hearing loss 08/23/2018  . Post-menopausal 08/23/2018  . Tear of right supraspinatus tendon 08/23/2018  . Overweight (BMI 25.0-29.9) 01/27/2017  . Boil, labium 11/22/2016  . Class 1 obesity due to excess calories without serious comorbidity with body mass index (BMI) of 32.0 to 32.9 in adult 07/26/2016  . Primary osteoarthritis of left knee 06/10/2016  . Laceration 06/10/2016  . Elevated LDL cholesterol level 06/10/2016    Coulter Oldaker Nilda Simmer PT, MPH  07/08/2019, 11:15 AM  [facility] [place], [place], [address] Phone: [phone]   Fax:  [phone]  Name: Donna [name] MRN: [id number] Date of Birth: 01-Dec-1951

## 2019-07-12 ENCOUNTER — Encounter: Payer: Self-pay | Admitting: Rehabilitative and Restorative Service Providers"

## 2019-07-12 ENCOUNTER — Ambulatory Visit: Payer: Medicare HMO | Admitting: Rehabilitative and Restorative Service Providers"

## 2019-07-12 ENCOUNTER — Other Ambulatory Visit: Payer: Self-pay

## 2019-07-12 DIAGNOSIS — G2589 Other specified extrapyramidal and movement disorders: Secondary | ICD-10-CM | POA: Diagnosis not present

## 2019-07-12 DIAGNOSIS — M6281 Muscle weakness (generalized): Secondary | ICD-10-CM | POA: Diagnosis not present

## 2019-07-12 DIAGNOSIS — R293 Abnormal posture: Secondary | ICD-10-CM

## 2019-07-12 DIAGNOSIS — R29898 Other symptoms and signs involving the musculoskeletal system: Secondary | ICD-10-CM | POA: Diagnosis not present

## 2019-07-12 DIAGNOSIS — M25511 Pain in right shoulder: Secondary | ICD-10-CM

## 2019-07-12 NOTE — Therapy (Addendum)
[place], [place], [address] Phone: [phone]   Fax:  [phone]  Physical Therapy Treatment  Patient Details  Name: Donna [name] MRN: [id number] Date of Birth: 1950-12-15 Referring Provider (PT): Dr Ophelia Charter    Encounter Date: 07/12/2019  PT End of Session - 07/12/19 1017    Visit Number  13    Number of Visits  24    Date for PT Re-Evaluation  08/19/19    PT Start Time  1016    PT Stop Time  1104    PT Time Calculation (min)  48 min    Activity Tolerance  Patient tolerated treatment well       Past Medical History:  Diagnosis Date  . Rosacea   . Tachycardia     Past Surgical History:  Procedure Laterality Date  . dilation curretage    . endometrial tumor    . NOSE SURGERY  1960  . PARTIAL HYSTERECTOMY  1979   removed one tube and one ovary   . SHOULDER ARTHROSCOPY WITH ROTATOR CUFF REPAIR AND SUBACROMIAL DECOMPRESSION Right 04/15/2019   Procedure: RIGHT SHOULDER ARTHROSCOPY WITH ROTATOR CUFF REPAIR AND SUBACROMIAL DECOMPRESSION, DISTAL CLAVICULECTOMY, BICEPS TENODESIS;  Surgeon: Hiram Gash, MD;  Location: [facility];  Service: Orthopedics;  Laterality: Right;  . TONSILLECTOMY  1957    There were no vitals filed for this visit.  Subjective Assessment - 07/12/19 1017    Subjective  Patient reports that the MD told her she is 50% now and that she should continue PT. He will see her in 6 weeks.    Currently in Pain?  No/denies                       [facility] Adult PT Treatment/Exercise - 07/12/19 0001      Shoulder Exercises: Supine   Other Supine Exercises  thoracic lift 5 sec x 10 reps; rhythmic stabilization shd at 90 deg small range mvt flex/ext; horiz ab/add; circles CW/CCW x 20 each       Shoulder Exercises: Sidelying   External Rotation  AROM;Strengthening;Right;20 reps;Weights   no resistance    External Rotation Weight (lbs)  1    ABduction  AROM;Strengthening;Right;10 reps;Weights   partial range elbow flexed at 90 deg    ABduction Weight (lbs)  1      Shoulder Exercises: Standing   Extension Limitations  bent forward shoudler extension 1# wt x 10     Row Limitations  bent forward row 1# wt x 10       Shoulder Exercises: Pulleys   Flexion  --   10 reps x 10 sec    Scaption  --   10 sec x 10    Other Pulley Exercises  bringing arms out and in neutral position ~ 1 min       Shoulder Exercises: Stretch   External Rotation Stretch  5 reps;10 seconds   w/ cane      Manual Therapy   Manual therapy comments  pt supine     Joint Mobilization  [place] movement     Soft tissue mobilization  soft tissue work for Rt shoulder girdle working through the Public relations account executive shoulder/deltoid/biceps/upper trap; teres     Passive ROM  PROM Rt shoulder flexion; scaption; ER; extension; horizontal abduction; extension; horizontal abduction as tolerated  by tissue extensibility and pt tolerance  PT Education - 07/12/19 1050    Education Details  HEP    Person(s) Educated  Patient    Methods  Explanation;Demonstration;Tactile cues;Verbal cues;Handout    Comprehension  Verbalized understanding;Returned demonstration;Verbal cues required;Tactile cues required          PT Long Term Goals - 07/08/19 1107      PT LONG TERM GOAL #1   Title  Improve posture and alignment with patient demonstrating improved upright posture engaging posterior shoulder girdle musculature to promote improved shoulder function    Time  12    Period  Weeks    Status  Revised    Target Date  08/19/19      PT LONG TERM GOAL #2   Title  Increase AROM Rt shoulder to 110-120 degrees flexion and abduction; ER in neutral to 30-35 deg    Time  12    Period  Weeks    Status  Revised    Target Date  08/19/19      PT LONG TERM GOAL #3   Time  12    Period  Weeks    Status  Revised      PT LONG TERM GOAL #4   Title  Independent in HEP     Time  12    Status  Revised    Target Date  08/19/19      PT LONG TERM GOAL #5   Title  Improve FOTO to </=36% limitation    Time  12    Period  Weeks    Status  Revised    Target Date  08/19/19            Plan - 07/12/19 1017    Clinical Impression Statement  MD is pleased with pt's progress She is working on her exercises at home. Added resistive exercises today. Continues to be limited in ROM and strength but is progressing.    Stability/Clinical Decision Making  Stable/Uncomplicated    Rehab Potential  Good    PT Frequency  2x / week    PT Duration  6 weeks    PT Treatment/Interventions  Patient/family education;ADLs/Self Care Home Management;Cryotherapy;Electrical Stimulation;Iontophoresis 4mg /ml Dexamethasone;Moist Heat;Ultrasound;Neuromuscular re-education;Manual techniques;Dry needling;Functional mobility training;Therapeutic activities;Therapeutic exercise    PT Next Visit Plan  progress with postural correction/education; ROM; manual work and PROM; modalities as indicated - focus on scapular stabilization/manual work/PROM    PT Home Exercise Plan  [email or web address]    Consulted and Agree with Plan of Care  Patient       Patient will benefit from skilled therapeutic intervention in order to improve the following deficits and impairments:  Pain, Increased muscle spasms, Increased fascial restricitons, Hypomobility, Decreased mobility, Decreased strength, Decreased range of motion, Decreased activity tolerance, Impaired UE functional use  Visit Diagnosis: 1. Acute pain of right shoulder   2. Scapular dyskinesis   3. Abnormal posture   4. Other symptoms and signs involving the musculoskeletal system   5. Muscle weakness (generalized)        Problem List Patient Active Problem List   Diagnosis Date Noted  . Plantar fasciitis, right 02/19/2019  . Osteopenia 09/02/2018  . Bilateral hearing loss 08/23/2018  . Post-menopausal 08/23/2018  . Tear of right supraspinatus tendon  08/23/2018  . Overweight (BMI 25.0-29.9) 01/27/2017  . Boil, labium 11/22/2016  . Class 1 obesity due to excess calories without serious comorbidity with body mass index (BMI) of 32.0 to 32.9 in adult 07/26/2016  . Primary osteoarthritis of left knee  06/10/2016  . Laceration 06/10/2016  . Elevated LDL cholesterol level 06/10/2016    Celyn Nilda Simmer PT, MPH  07/12/2019, 2:22 PM  [facility] [place], [place], [address] Phone: [phone]   Fax:  [phone]  Name: Donna [name] MRN: [id number] Date of Birth: December 15, 1950

## 2019-07-12 NOTE — Patient Instructions (Addendum)
Thoracic Lift    Press shoulders down. Then lift mid-thoracic spine (area between the shoulder blades). Lift the breastbone slightly. Hold __2-3_ seconds. Relax. Repeat __10_ times.   Lying on back  Shoulder blades down and back  Right arm pointing toward ceiling  Elbow straight Move hand/arm up and down; side to sde; circles CW/CCW 20-30 reps each position     Progressive Resisted: External Rotation (Side-Lying) do not lift this high     Holding ___1-2_ pound weight, towel under arm, raise right forearm toward ceiling. Keep elbow bent and at side. Repeat __10__ times per set. Do __1-2__ sets per session. Do __1__ sessions per day.   Abduction (Side-Lying) no pain     Lie on left side. Raise arm above head. Keep palm forward. 1 pound weight  Repeat __5-10 __ times per set. Do __1-2__ sets per session. Do __1__ sessions per day.   Scapular: Retraction standing     Holding __1-2__ pound weights, keep arms out from sides and elbows bent. Pull elbows back, pinching shoulder blades together. Repeat __10__ times per set. Do __1-3__ sets per session. Do __1__ sessions per day.    Progressive Resisted: Extension standing     Holding _1-2___ pound weights, arms back, raise arms from floor, keeping elbows straight. Repeat __10__ times per set. Do _1-3___ sets per session. Do ___1_ sessions per day.

## 2019-07-15 ENCOUNTER — Other Ambulatory Visit: Payer: Self-pay

## 2019-07-15 ENCOUNTER — Encounter: Payer: Self-pay | Admitting: Physical Therapy

## 2019-07-15 ENCOUNTER — Ambulatory Visit: Payer: Medicare HMO | Admitting: Physical Therapy

## 2019-07-15 DIAGNOSIS — G2589 Other specified extrapyramidal and movement disorders: Secondary | ICD-10-CM | POA: Diagnosis not present

## 2019-07-15 DIAGNOSIS — R293 Abnormal posture: Secondary | ICD-10-CM | POA: Diagnosis not present

## 2019-07-15 DIAGNOSIS — M25511 Pain in right shoulder: Secondary | ICD-10-CM

## 2019-07-15 DIAGNOSIS — M6281 Muscle weakness (generalized): Secondary | ICD-10-CM | POA: Diagnosis not present

## 2019-07-15 NOTE — Therapy (Signed)
[place], [place], [address] Phone: [phone]   Fax:  [phone]  Physical Therapy Treatment  Patient Details  Name: Donna [name] MRN: [id number] Date of Birth: [birth year] Referring Provider (PT): Dr Ophelia Charter    Encounter Date: 07/15/2019  PT End of Session - 07/15/19 1021    Visit Number  14    Number of Visits  24    Date for PT Re-Evaluation  08/19/19    PT Start Time  1019    PT Stop Time  1100    PT Time Calculation (min)  41 min       Past Medical History:  Diagnosis Date  . Rosacea   . Tachycardia     Past Surgical History:  Procedure Laterality Date  . dilation curretage    . endometrial tumor    . NOSE SURGERY  1960  . PARTIAL HYSTERECTOMY  1979   removed one tube and one ovary   . SHOULDER ARTHROSCOPY WITH ROTATOR CUFF REPAIR AND SUBACROMIAL DECOMPRESSION Right 04/15/2019   Procedure: RIGHT SHOULDER ARTHROSCOPY WITH ROTATOR CUFF REPAIR AND SUBACROMIAL DECOMPRESSION, DISTAL CLAVICULECTOMY, BICEPS TENODESIS;  Surgeon: Hiram Gash, MD;  Location: [facility];  Service: Orthopedics;  Laterality: Right;  . TONSILLECTOMY  1957    There were no vitals filed for this visit.  Subjective Assessment - 07/15/19 1022    Subjective  Pt reports she is unable to lift arm 1# while laying on side, "but that makes my arm so sore".    Patient Stated Goals  to be able to use arm and shoulder again    Currently in Pain?  No/denies    Pain Score  0-No pain         [facility] PT Assessment - 07/15/19 0001      Assessment   Medical Diagnosis  Rt shoulder RCR; DCR; SAD     Referring Provider (PT)  Dr Ophelia Charter     Onset Date/Surgical Date  04/15/19    Hand Dominance  Right    Next MD Visit  08/12/19    Prior Therapy  here for Lt knee 3 visits 2017      [facility] Adult PT Treatment/Exercise - 07/15/19 0001      Shoulder Exercises: Supine   External Rotation   Strengthening;Both;10 reps   with scap squeeze   Theraband Level (Shoulder External Rotation)  Level 1 (Yellow)    Diagonals  --    Theraband Level (Shoulder Diagonals)  --    Other Supine Exercises  elbow press x 3 sec x 10 reps     Other Supine Exercises  D2 flexion pattern with RUE x 5 reps, then with yellow band.       Shoulder Exercises: Prone   Other Prone Exercises  trial of prone row, shoulder ext to neutral, and horiz abdct x 2 of each position (tolerated well)      Shoulder Exercises: Sidelying   External Rotation  Strengthening;Right;10 reps    External Rotation Weight (lbs)  1    ABduction  Strengthening;Right;10 reps   palm down, partial range    ABduction Weight (lbs)  1      Shoulder Exercises: Standing   External Rotation  AAROM;Right;10 reps   cane    Row  Strengthening;Both;10 reps    Theraband Level (Shoulder Row)  Level 2 (Red)    Other Standing Exercises  wall ladder with Rt shoulder in flexion x 3  reps (to #26), scaption x 3 reps (to #24)    Other Standing Exercises  Rt shoulder flexion, lifting 1# wt to shoulder height, with eccentric lowering x 8, then reaching to overhead shelf and lowering slowly x 5 reps  (tactile cues to avoid hiking shoulder)      Shoulder Exercises: Pulleys   Flexion  --   10 reps x 10 sec    Scaption  --   10 sec x 10      Shoulder Exercises: ROM/Strengthening   Rhythmic Stabilization, Supine  with Rt shoulder 45, 90, 115 with perterbations x 20 sec each                   PT Long Term Goals - 07/08/19 1107      PT LONG TERM GOAL #1   Title  Improve posture and alignment with patient demonstrating improved upright posture engaging posterior shoulder girdle musculature to promote improved shoulder function    Time  12    Period  Weeks    Status  Revised    Target Date  08/19/19      PT LONG TERM GOAL #2   Title  Increase AROM Rt shoulder to 110-120 degrees flexion and abduction; ER in neutral to 30-35 deg    Time   12    Period  Weeks    Status  Revised    Target Date  08/19/19      PT LONG TERM GOAL #3   Time  12    Period  Weeks    Status  Revised      PT LONG TERM GOAL #4   Title  Independent in HEP    Time  12    Status  Revised    Target Date  08/19/19      PT LONG TERM GOAL #5   Title  Improve FOTO to </=36% limitation    Time  12    Period  Weeks    Status  Revised    Target Date  08/19/19            Plan - 07/15/19 1305    Clinical Impression Statement  Session focused on ROM and strengthening for Rt shoulder.  Pt tolerated all exercises well, without pain just fatigue. Pt making gradual gains with each visit. Pt will benefit from continued PT intervention to maximize functional mobility.    Stability/Clinical Decision Making  Stable/Uncomplicated    Rehab Potential  Good    PT Frequency  2x / week    PT Duration  6 weeks    PT Treatment/Interventions  Patient/family education;ADLs/Self Care Home Management;Cryotherapy;Electrical Stimulation;Iontophoresis 4mg /ml Dexamethasone;Moist Heat;Ultrasound;Neuromuscular re-education;Manual techniques;Dry needling;Functional mobility training;Therapeutic activities;Therapeutic exercise    PT Next Visit Plan  add prone scapular strengthening exercises to HEP.  continue manual work/ PROM.    PT Home Exercise Plan  [email or web address]    Consulted and Agree with Plan of Care  Patient       Patient will benefit from skilled therapeutic intervention in order to improve the following deficits and impairments:  Pain, Increased muscle spasms, Increased fascial restricitons, Hypomobility, Decreased mobility, Decreased strength, Decreased range of motion, Decreased activity tolerance, Impaired UE functional use  Visit Diagnosis: 1. Acute pain of right shoulder   2. Scapular dyskinesis   3. Abnormal posture   4. Muscle weakness (generalized)        Problem List Patient Active Problem List   Diagnosis Date Noted  . Plantar fasciitis,  right  02/19/2019  . Osteopenia 09/02/2018  . Bilateral hearing loss 08/23/2018  . Post-menopausal 08/23/2018  . Tear of right supraspinatus tendon 08/23/2018  . Overweight (BMI 25.0-29.9) 01/27/2017  . Boil, labium 11/22/2016  . Class 1 obesity due to excess calories without serious comorbidity with body mass index (BMI) of 32.0 to 32.9 in adult 07/26/2016  . Primary osteoarthritis of left knee 06/10/2016  . Laceration 06/10/2016  . Elevated LDL cholesterol level 06/10/2016   Kerin Perna, PTA 07/15/19 1:08 PM  [facility] [place], [place], [address] Phone: [phone]   Fax:  [phone]  Name: Donna [name] MRN: [id number] Date of Birth: August 04, 1951

## 2019-07-19 ENCOUNTER — Other Ambulatory Visit: Payer: Self-pay

## 2019-07-19 ENCOUNTER — Encounter: Payer: Self-pay | Admitting: Rehabilitative and Restorative Service Providers"

## 2019-07-19 ENCOUNTER — Ambulatory Visit (INDEPENDENT_AMBULATORY_CARE_PROVIDER_SITE_OTHER): Payer: Medicare HMO | Admitting: Rehabilitative and Restorative Service Providers"

## 2019-07-19 DIAGNOSIS — M25511 Pain in right shoulder: Secondary | ICD-10-CM | POA: Diagnosis not present

## 2019-07-19 DIAGNOSIS — R29898 Other symptoms and signs involving the musculoskeletal system: Secondary | ICD-10-CM | POA: Diagnosis not present

## 2019-07-19 DIAGNOSIS — G2589 Other specified extrapyramidal and movement disorders: Secondary | ICD-10-CM

## 2019-07-19 DIAGNOSIS — M6281 Muscle weakness (generalized): Secondary | ICD-10-CM

## 2019-07-19 DIAGNOSIS — R293 Abnormal posture: Secondary | ICD-10-CM

## 2019-07-19 NOTE — Therapy (Signed)
[place], [place], [address] Phone: [phone]   Fax:  [phone]  Physical Therapy Treatment  Patient Details  Name: Donna [name] MRN: [id number] Date of Birth: [birth year] Referring Provider (PT): Dr Ophelia Charter    Encounter Date: 07/19/2019  PT End of Session - 07/19/19 1018    Visit Number  15    Number of Visits  24    Date for PT Re-Evaluation  08/19/19    PT Start Time  1017    PT Stop Time  1100    PT Time Calculation (min)  43 min    Activity Tolerance  Patient tolerated treatment well       Past Medical History:  Diagnosis Date  . Rosacea   . Tachycardia     Past Surgical History:  Procedure Laterality Date  . dilation curretage    . endometrial tumor    . NOSE SURGERY  1960  . PARTIAL HYSTERECTOMY  1979   removed one tube and one ovary   . SHOULDER ARTHROSCOPY WITH ROTATOR CUFF REPAIR AND SUBACROMIAL DECOMPRESSION Right 04/15/2019   Procedure: RIGHT SHOULDER ARTHROSCOPY WITH ROTATOR CUFF REPAIR AND SUBACROMIAL DECOMPRESSION, DISTAL CLAVICULECTOMY, BICEPS TENODESIS;  Surgeon: Hiram Gash, MD;  Location: [facility];  Service: Orthopedics;  Laterality: Right;  . TONSILLECTOMY  1957    There were no vitals filed for this visit.  Subjective Assessment - 07/19/19 1019    Subjective  Cleaned house this weekend and over did it.    Currently in Pain?  No/denies         [facility] PT Assessment - 07/19/19 0001      Assessment   Medical Diagnosis  Rt shoulder RCR; DCR; SAD     Referring Provider (PT)  Dr Ophelia Charter     Onset Date/Surgical Date  04/15/19    Hand Dominance  Right    Next MD Visit  08/19/2019    Prior Therapy  here for Lt knee 3 visits 2017      Palpation   Palpation comment  muscuar tightness Rt upper quarter - pecs, upper trap, leveator, deltoid, biceps                    [facility] Adult PT Treatment/Exercise - 07/19/19 0001      Shoulder Exercises: Standing   External Rotation  Strengthening;Right;20 reps;Theraband    Theraband Level (Shoulder External Rotation)  Level 2 (Red)    Internal Rotation  Strengthening;Right;20 reps;Theraband    Theraband Level (Shoulder Internal Rotation)  Level 2 (Red)    Extension  Strengthening;Both;20 reps;Theraband    Theraband Level (Shoulder Extension)  Level 2 (Red)    Row  Strengthening;Both;10 reps    Theraband Level (Shoulder Row)  Level 2 (Red)    Other Standing Exercises  standing working on ER with ball on wall       Shoulder Exercises: Pulleys   Flexion  --   10 reps x 10 sec    Scaption  --   10 sec x 10      Shoulder Exercises: Stretch   Corner Stretch  3 reps;10 seconds;20 seconds   lower two positions      Manual Therapy   Manual therapy comments  pt supine     Joint Mobilization  [place] movement     Soft tissue mobilization  soft tissue work for Rt shoulder girdle working through the Public relations account executive shoulder/deltoid/biceps/upper trap; teres  Passive ROM  PROM Rt shoulder flexion; scaption; ER; extension; horizontal abduction; extension; horizontal abduction as tolerated  by tissue extensibility and pt tolerance              PT Education - 07/19/19 1035    Education Details  HEP    Person(s) Educated  Patient    Methods  Explanation;Demonstration;Tactile cues;Verbal cues;Handout    Comprehension  Verbalized understanding;Returned demonstration;Verbal cues required;Tactile cues required          PT Long Term Goals - 07/08/19 1107      PT LONG TERM GOAL #1   Title  Improve posture and alignment with patient demonstrating improved upright posture engaging posterior shoulder girdle musculature to promote improved shoulder function    Time  12    Period  Weeks    Status  Revised    Target Date  08/19/19      PT LONG TERM GOAL #2   Title  Increase AROM Rt shoulder to 110-120 degrees flexion and abduction; ER in neutral to 30-35 deg    Time  12     Period  Weeks    Status  Revised    Target Date  08/19/19      PT LONG TERM GOAL #3   Time  12    Period  Weeks    Status  Revised      PT LONG TERM GOAL #4   Title  Independent in HEP    Time  12    Status  Revised    Target Date  08/19/19      PT LONG TERM GOAL #5   Title  Improve FOTO to </=36% limitation    Time  12    Period  Weeks    Status  Revised    Target Date  08/19/19            Plan - 07/19/19 1019    Clinical Impression Statement  Progressing with functional activities and strengthening exercises. Needs to continue to work on ROM as well as strength. Will benefit from continued PT to address remaining deficits    Stability/Clinical Decision Making  Stable/Uncomplicated    Rehab Potential  Good    PT Frequency  2x / week    PT Duration  6 weeks    PT Treatment/Interventions  Patient/family education;ADLs/Self Care Home Management;Cryotherapy;Electrical Stimulation;Iontophoresis 4mg /ml Dexamethasone;Moist Heat;Ultrasound;Neuromuscular re-education;Manual techniques;Dry needling;Functional mobility training;Therapeutic activities;Therapeutic exercise    PT Next Visit Plan  add prone scapular strengthening exercises to HEP.  continue manual work/ PROM.    PT Home Exercise Plan  [email or web address]    Consulted and Agree with Plan of Care  Patient       Patient will benefit from skilled therapeutic intervention in order to improve the following deficits and impairments:  Pain, Increased muscle spasms, Increased fascial restricitons, Hypomobility, Decreased mobility, Decreased strength, Decreased range of motion, Decreased activity tolerance, Impaired UE functional use  Visit Diagnosis: 1. Acute pain of right shoulder   2. Scapular dyskinesis   3. Abnormal posture   4. Muscle weakness (generalized)   5. Other symptoms and signs involving the musculoskeletal system        Problem List Patient Active Problem List   Diagnosis Date Noted  . Plantar fasciitis, right  02/19/2019  . Osteopenia 09/02/2018  . Bilateral hearing loss 08/23/2018  . Post-menopausal 08/23/2018  . Tear of right supraspinatus tendon 08/23/2018  . Overweight (BMI 25.0-29.9) 01/27/2017  . Boil, labium 11/22/2016  .  Class 1 obesity due to excess calories without serious comorbidity with body mass index (BMI) of 32.0 to 32.9 in adult 07/26/2016  . Primary osteoarthritis of left knee 06/10/2016  . Laceration 06/10/2016  . Elevated LDL cholesterol level 06/10/2016    Gjon Letarte Nilda Simmer PT, MPH  07/19/2019, 10:59 AM  [facility] [place], [place], [address] Phone: [phone]   Fax:  [phone]  Name: Mearl Harewood MRN: [id number] Date of Birth: 1951/05/01

## 2019-07-19 NOTE — Patient Instructions (Addendum)
Flexibility: Corner Stretch    Standing in corner with hands at shoulder level and then just above shoulder level step one foot  forward until a stretch is felt across chest. Hold _20-30 ___ seconds. Repeat __3 __ times each position Do __2__ sessions per day.    Access Code: [email or web address]  URL: [email or web address]  Date: 07/19/2019  Prepared by: Gillermo Murdoch   Exercises  Seated Cervical Retraction - 10 reps - 1 sets - 3x daily - 7x weekly  Standing Scapular Retraction - 10 reps - 1 sets - 10 hold - 3x daily - 7x weekly  Shoulder External Rotation - 5-10 reps - 1 sets - 10 sec hold - 3-4x daily - 7x weekly  Circular Shoulder Pendulum with Table Support - 3-5 reps - 1 sets - 3-4x daily - 7x weekly  Seated Shoulder Flexion AAROM with Pulley Behind - 5-10 reps - 1 sets - 10sec hold - 2x daily - 7x weekly  Seated Shoulder Flexion Towel Slide at Table Top - 10 reps - 1 sets - 10 sec hold - 3-4x daily - 7x weekly  Standing Shoulder Extension with Dowel - 3 reps - 1 sets - 30 sec hold - 3x daily - 7x weekly  Standing Backward Shoulder Rolls - 110-15 reps - 1 sets - 3x daily - 7x weekly  Seated Shoulder Scaption AAROM with Pulley at Side - 5-10 reps - 1 sets - 10 sec hold - 2x daily - 7x weekly  Standing Shoulder External Rotation Stretch in Doorway - 10 reps - 1 sets - 10 sec hold - 2x daily - 7x weekly  Isometric Shoulder Abduction at Wall - 10 reps - 1 sets - 10 sec hold - 2x daily - 7x weekly  Isometric Shoulder Extension at Wall - 10 reps - 1 sets - 10 sec hold - 2x daily - 7x weekly  Isometric Shoulder External Rotation with Ball at [employer] - 10 reps - 1 sets - 10 sec hold - 2x daily - 7x weekly  Standing Shoulder and Trunk Flexion at Table - 3 reps - 1 sets - 30 sec hold - 2x daily - 7x weekly  Supine Shoulder Flexion PROM - 10 reps - 1 sets - 10 sec hold - 2x daily - 7x weekly  Scapular Retraction with Resistance - 10 reps - 2-3 sets - 1x daily - 7x weekly  Scapular Retraction  with Resistance Advanced - 10 reps - 2-3 sets - 1x daily - 7x weekly  Shoulder External Rotation Reactive Isometrics - 10 reps - 2-3 sets - 2-3sec hold - 1x daily - 7x weekly  Standing Shoulder Scaption - 10 reps - 2-3 sets - 2 sec hold - 2x daily - 7x weekly  Standing Shoulder Flexion to 90 Degrees - 10 reps - 2-3 sets - 2 sec hold - 2x daily - 7x weekly  Sidelying Shoulder External Rotation - 10 reps - 1 sets - 2 sec hold - 2-3x daily - 7x weekly  Shoulder External Rotation - 3 reps - 1 sets - 30 sec hold - 2x daily - 7x weekly  Shoulder Internal Rotation with Resistance - 3 reps - 1 sets - 30 sec hold - 2x daily - 7x weekly  Standing Wall Ball Circles with Mini Swiss Ball - 20-30 reps - 1 sets - 1x daily - 7x weekly

## 2019-07-21 ENCOUNTER — Telehealth: Payer: Self-pay | Admitting: Neurology

## 2019-07-21 DIAGNOSIS — R350 Frequency of micturition: Secondary | ICD-10-CM

## 2019-07-21 NOTE — Telephone Encounter (Signed)
Can she just drop off urine sample downstairs? She does not have to come into office? If so send downstairs. With symptoms for a couple of weeks it could either be a bad UTI or not one at all.

## 2019-07-21 NOTE — Telephone Encounter (Signed)
Spoke with patient and she is agreeable to have urine tested downstairs. Order entered.

## 2019-07-21 NOTE — Telephone Encounter (Signed)
Patient left vm stating she thinks she has a UTI. Has had symptoms for a couple weeks. She does not want to come into the office. Is this something you could see virtually?

## 2019-07-22 ENCOUNTER — Encounter: Payer: Self-pay | Admitting: Rehabilitative and Restorative Service Providers"

## 2019-07-22 ENCOUNTER — Ambulatory Visit: Payer: Medicare HMO | Admitting: Rehabilitative and Restorative Service Providers"

## 2019-07-22 ENCOUNTER — Other Ambulatory Visit: Payer: Self-pay

## 2019-07-22 DIAGNOSIS — M25511 Pain in right shoulder: Secondary | ICD-10-CM | POA: Diagnosis not present

## 2019-07-22 DIAGNOSIS — M6281 Muscle weakness (generalized): Secondary | ICD-10-CM | POA: Diagnosis not present

## 2019-07-22 DIAGNOSIS — R29898 Other symptoms and signs involving the musculoskeletal system: Secondary | ICD-10-CM | POA: Diagnosis not present

## 2019-07-22 DIAGNOSIS — G2589 Other specified extrapyramidal and movement disorders: Secondary | ICD-10-CM

## 2019-07-22 DIAGNOSIS — R293 Abnormal posture: Secondary | ICD-10-CM

## 2019-07-22 NOTE — Therapy (Signed)
[place], [place], [address] Phone: [phone]   Fax:  [phone]  Physical Therapy Treatment  Patient Details  Name: Donna [name] MRN: [id number] Date of Birth: November 16, 1951 Referring Provider (PT): Dr Ophelia Charter    Encounter Date: 07/22/2019  PT End of Session - 07/22/19 1023    Visit Number  16    Number of Visits  24    Date for PT Re-Evaluation  08/19/19    PT Start Time  1015    PT Stop Time  1106    PT Time Calculation (min)  51 min    Activity Tolerance  Patient tolerated treatment well       Past Medical History:  Diagnosis Date  . Rosacea   . Tachycardia     Past Surgical History:  Procedure Laterality Date  . dilation curretage    . endometrial tumor    . NOSE SURGERY  1960  . PARTIAL HYSTERECTOMY  1979   removed one tube and one ovary   . SHOULDER ARTHROSCOPY WITH ROTATOR CUFF REPAIR AND SUBACROMIAL DECOMPRESSION Right 04/15/2019   Procedure: RIGHT SHOULDER ARTHROSCOPY WITH ROTATOR CUFF REPAIR AND SUBACROMIAL DECOMPRESSION, DISTAL CLAVICULECTOMY, BICEPS TENODESIS;  Surgeon: Hiram Gash, MD;  Location: [facility];  Service: Orthopedics;  Laterality: Right;  . TONSILLECTOMY  1957    There were no vitals filed for this visit.  Subjective Assessment - 07/22/19 1023    Subjective  Can tell she is able to do more with Rt UE at home.    Currently in Pain?  No/denies                       [facility] Adult PT Treatment/Exercise - 07/22/19 0001      Shoulder Exercises: Standing   Flexion  Strengthening;Both;15 reps;Weights   back at noodle cues for scapular control    Shoulder Flexion Weight (lbs)  2    Flexion Limitations  sliding Rt hand up inclined table focus on scapular control 20 reps x 2 sets hand in pillowcase to allow sliding     ABduction  Strengthening;Both;20 reps;Weights   scaption   Shoulder ABduction Weight (lbs)  2    Other  Standing Exercises  wall push yup x 10 x 2 sets; reverse wall push up x 10 x 2 sets       Shoulder Exercises: Pulleys   Flexion  --   10 reps x 10 sec    Scaption  --   10 sec x 10      Shoulder Exercises: Therapy Ball   Other Therapy Ball Exercises  holding small ball btn hands for work through the elbow and shoulder with elbow/shoulder flexioin/extension; press out; press up to eye level; diagonals;       Shoulder Exercises: Stretch   Wall Stretch - Flexion  5 reps;10 seconds   sliding hand up wall    Other Shoulder Stretches  doorway stretch 30 sec x 2 reps lower and mid positions       Manual Therapy   Manual therapy comments  pt supine     Joint Mobilization  [place] movement     Soft tissue mobilization  soft tissue work for Rt shoulder girdle working through the pecs/anterior shoulder/deltoid/biceps/upper trap; teres     Passive ROM  PROM Rt shoulder flexion; scaption; ER; extension; horizontal abduction; extension; horizontal abduction as tolerated  by tissue extensibility and pt tolerance  PT Education - 07/22/19 1057    Education Details  HEP    Person(s) Educated  Patient    Methods  Explanation;Demonstration;Tactile cues;Verbal cues;Handout    Comprehension  Verbalized understanding;Returned demonstration;Verbal cues required;Tactile cues required          PT Long Term Goals - 07/08/19 1107      PT LONG TERM GOAL #1   Title  Improve posture and alignment with patient demonstrating improved upright posture engaging posterior shoulder girdle musculature to promote improved shoulder function    Time  12    Period  Weeks    Status  Revised    Target Date  08/19/19      PT LONG TERM GOAL #2   Title  Increase AROM Rt shoulder to 110-120 degrees flexion and abduction; ER in neutral to 30-35 deg    Time  12    Period  Weeks    Status  Revised    Target Date  08/19/19      PT LONG TERM GOAL #3   Time  12    Period  Weeks    Status  Revised       PT LONG TERM GOAL #4   Title  Independent in HEP    Time  12    Status  Revised    Target Date  08/19/19      PT LONG TERM GOAL #5   Title  Improve FOTO to </=36% limitation    Time  12    Period  Weeks    Status  Revised    Target Date  08/19/19            Plan - 07/22/19 1023    Clinical Impression Statement  Continues to progress with strengthening. tolerating corner stretch better and added trial of doorway stretch today. Progressing well toward stated goals of therapy.    Stability/Clinical Decision Making  Stable/Uncomplicated    Rehab Potential  Good    PT Frequency  2x / week    PT Duration  6 weeks    PT Treatment/Interventions  Patient/family education;ADLs/Self Care Home Management;Cryotherapy;Electrical Stimulation;Iontophoresis 4mg /ml Dexamethasone;Moist Heat;Ultrasound;Neuromuscular re-education;Manual techniques;Dry needling;Functional mobility training;Therapeutic activities;Therapeutic exercise    PT Next Visit Plan  add strengthening exercises to HEP.  continue manual work/ PROM.    PT Home Exercise Plan  [email or web address]    Consulted and Agree with Plan of Care  Patient       Patient will benefit from skilled therapeutic intervention in order to improve the following deficits and impairments:  Pain, Increased muscle spasms, Increased fascial restricitons, Hypomobility, Decreased mobility, Decreased strength, Decreased range of motion, Decreased activity tolerance, Impaired UE functional use  Visit Diagnosis: Acute pain of right shoulder  Scapular dyskinesis  Abnormal posture  Muscle weakness (generalized)  Other symptoms and signs involving the musculoskeletal system     Problem List Patient Active Problem List   Diagnosis Date Noted  . Plantar fasciitis, right 02/19/2019  . Osteopenia 09/02/2018  . Bilateral hearing loss 08/23/2018  . Post-menopausal 08/23/2018  . Tear of right supraspinatus tendon 08/23/2018  . Overweight (BMI 25.0-29.9)  01/27/2017  . Boil, labium 11/22/2016  . Class 1 obesity due to excess calories without serious comorbidity with body mass index (BMI) of 32.0 to 32.9 in adult 07/26/2016  . Primary osteoarthritis of left knee 06/10/2016  . Laceration 06/10/2016  . Elevated LDL cholesterol level 06/10/2016    Hadiya Spoerl Nilda Simmer PT, MPH  07/22/2019, 11:16 AM  [facility] [place], [place], [address] Phone: [phone]   Fax:  [phone]  Name: Kierre Deines MRN: [id number] Date of Birth: 08/29/1951

## 2019-07-22 NOTE — Patient Instructions (Addendum)
    Scapula Adduction With Pectorals, Low   Stand in doorframe with palms against frame and arms at 45. Lean forward and squeeze shoulder blades. Hold _20-30__ seconds. Repeat __3_ times per session. Do _2__ sessions per day.   Scapula Adduction With Pectorals, Mid-Range   Stand in doorframe with palms against frame and arms at 90. Lean forward and squeeze shoulder blades. Hold __20-30_ seconds. Repeat _3__ times per session. Do __2_ sessions per day. \Scapula Adduction With Pectorals, High   Stand in doorframe with palms against frame and arms at 120. Lean forward and squeeze shoulder blades. Hold _20-30__ seconds. Repeat _3__ times per session. Do ___ sessions per day.     Access Code: [email or web address]  URL: [email or web address]  Date: 07/22/2019  Prepared by: Gillermo Murdoch   Exercises  Shoulder Flexion Wall Slide with Towel - 3-5 reps - 1 sets - 20-30 sec hold - 2x daily - 7x weekly  Standing Single Arm Shoulder Flexion Towel Slide at Table Top - 3-5 reps - 1 sets - 30 sec hold - 2x daily - 7x weekly

## 2019-07-23 DIAGNOSIS — R350 Frequency of micturition: Secondary | ICD-10-CM | POA: Diagnosis not present

## 2019-07-25 LAB — URINALYSIS, MICROSCOPIC ONLY: Hyaline Cast: NONE SEEN /LPF

## 2019-07-25 LAB — URINE CULTURE
MICRO NUMBER:: 798727
SPECIMEN QUALITY:: ADEQUATE

## 2019-07-26 ENCOUNTER — Encounter: Payer: Self-pay | Admitting: Rehabilitative and Restorative Service Providers"

## 2019-07-26 ENCOUNTER — Ambulatory Visit: Payer: Medicare HMO | Admitting: Rehabilitative and Restorative Service Providers"

## 2019-07-26 ENCOUNTER — Other Ambulatory Visit: Payer: Self-pay

## 2019-07-26 DIAGNOSIS — M25511 Pain in right shoulder: Secondary | ICD-10-CM

## 2019-07-26 DIAGNOSIS — G2589 Other specified extrapyramidal and movement disorders: Secondary | ICD-10-CM | POA: Diagnosis not present

## 2019-07-26 DIAGNOSIS — M6281 Muscle weakness (generalized): Secondary | ICD-10-CM | POA: Diagnosis not present

## 2019-07-26 DIAGNOSIS — R29898 Other symptoms and signs involving the musculoskeletal system: Secondary | ICD-10-CM

## 2019-07-26 DIAGNOSIS — R293 Abnormal posture: Secondary | ICD-10-CM

## 2019-07-26 MED ORDER — NITROFURANTOIN MONOHYD MACRO 100 MG PO CAPS: 100.0000 mg | ORAL_CAPSULE | Freq: Two times a day (BID) | ORAL | 0 refills | Status: DC

## 2019-07-26 NOTE — Telephone Encounter (Signed)
Urine culture confirmed E.coli. treated with macrobid.

## 2019-07-26 NOTE — Therapy (Signed)
[place], [place], [address] Phone: [phone]   Fax:  [phone]  Physical Therapy Treatment  Patient Details  Name: Donna [name] MRN: [id number] Date of Birth: 1951-03-03 Referring Provider (PT): Dr Ophelia Charter    Encounter Date: 07/26/2019  PT End of Session - 07/26/19 1058    Visit Number  17    Number of Visits  24    Date for PT Re-Evaluation  08/19/19    PT Start Time  1058    PT Stop Time  1145    PT Time Calculation (min)  47 min    Activity Tolerance  Patient tolerated treatment well       Past Medical History:  Diagnosis Date  . Rosacea   . Tachycardia     Past Surgical History:  Procedure Laterality Date  . dilation curretage    . endometrial tumor    . NOSE SURGERY  1960  . PARTIAL HYSTERECTOMY  1979   removed one tube and one ovary   . SHOULDER ARTHROSCOPY WITH ROTATOR CUFF REPAIR AND SUBACROMIAL DECOMPRESSION Right 04/15/2019   Procedure: RIGHT SHOULDER ARTHROSCOPY WITH ROTATOR CUFF REPAIR AND SUBACROMIAL DECOMPRESSION, DISTAL CLAVICULECTOMY, BICEPS TENODESIS;  Surgeon: Hiram Gash, MD;  Location: [facility];  Service: Orthopedics;  Laterality: Right;  . TONSILLECTOMY  1957    There were no vitals filed for this visit.  Subjective Assessment - 07/26/19 1058    Subjective  Patient reports that her shoulder is a little sore - may be from the doorway stretch Patient reports that she has some anxiety which she associates with masking and the isolation associated with COVID19 quarantine. She does have some tachycardia which has been treated medically and she has minimal trouble with that now.    Currently in Pain?  No/denies         [facility] PT Assessment - 07/26/19 0001      Assessment   Medical Diagnosis  Rt shoulder RCR; DCR; SAD     Referring Provider (PT)  Dr Ophelia Charter     Onset Date/Surgical Date  04/15/19    Hand Dominance  Right    Next MD  Visit  08/19/2019    Prior Therapy  here for Lt knee 3 visits 2017      AROM   Right Shoulder Extension  46 Degrees    Right Shoulder Flexion  115 Degrees    Right Shoulder ABduction  100 Degrees    Right Shoulder Internal Rotation  --   hand to sacrum   Right Shoulder External Rotation  30 Degrees   arm in neurtal elbow bent to 90 deg flexion    Left Shoulder Extension  45 Degrees    Left Shoulder Flexion  133 Degrees    Left Shoulder ABduction  120 Degrees                   [facility] Adult PT Treatment/Exercise - 07/26/19 0001      Shoulder Exercises: Standing   External Rotation  Strengthening;Right;20 reps;Theraband    Theraband Level (Shoulder External Rotation)  Level 3 (Green)    Internal Rotation  Strengthening;Right;20 reps;Theraband    Theraband Level (Shoulder Internal Rotation)  Level 3 (Green)    Extension  Strengthening;Both;20 reps;Theraband    Theraband Level (Shoulder Extension)  Level 3 (Green)    Row  Strengthening;Both;10 reps    Theraband Level (Shoulder Row)  Level 3 (Green)  Row Limitations  bow and arrow step back x 10 reps green TB; row at 45 deg x 10 reps green TB       Shoulder Exercises: Pulleys   Flexion  --   10 reps x 10 sec    Scaption  --   10 sec x 10      Shoulder Exercises: Stretch   Wall Stretch - Flexion  5 reps;10 seconds   sliding hand up wall    Other Shoulder Stretches  doorway stretch 30 sec x 2 reps lower and mid positions       Manual Therapy   Manual therapy comments  pt supine     Joint Mobilization  [place] movement     Soft tissue mobilization  soft tissue work for Rt shoulder girdle working through the pecs/anterior shoulder/deltoid/biceps/upper trap; teres     Passive ROM  PROM Rt shoulder flexion; scaption; ER; extension; horizontal abduction; extension; horizontal abduction as tolerated  by tissue extensibility and pt tolerance              PT Education - 07/26/19 1121    Education Details  HEP     Person(s) Educated  Patient    Methods  Explanation;Demonstration;Tactile cues;Verbal cues;Handout    Comprehension  Verbalized understanding;Returned demonstration;Verbal cues required;Tactile cues required          PT Long Term Goals - 07/08/19 1107      PT LONG TERM GOAL #1   Title  Improve posture and alignment with patient demonstrating improved upright posture engaging posterior shoulder girdle musculature to promote improved shoulder function    Time  12    Period  Weeks    Status  Revised    Target Date  08/19/19      PT LONG TERM GOAL #2   Title  Increase AROM Rt shoulder to 110-120 degrees flexion and abduction; ER in neutral to 30-35 deg    Time  12    Period  Weeks    Status  Revised    Target Date  08/19/19      PT LONG TERM GOAL #3   Time  12    Period  Weeks    Status  Revised      PT LONG TERM GOAL #4   Title  Independent in HEP    Time  12    Status  Revised    Target Date  08/19/19      PT LONG TERM GOAL #5   Title  Improve FOTO to </=36% limitation    Time  12    Period  Weeks    Status  Revised    Target Date  08/19/19            Plan - 07/26/19 1058    Clinical Impression Statement  Progressed to green TB for resistive TB exercises. Progressing gradually with ROM and strength. Will benefit from continue treatment to achieve goals of rehab and reach maximum rehab potential.    Stability/Clinical Decision Making  Stable/Uncomplicated    Rehab Potential  Good    PT Frequency  2x / week    PT Duration  6 weeks    PT Treatment/Interventions  Patient/family education;ADLs/Self Care Home Management;Cryotherapy;Electrical Stimulation;Iontophoresis 4mg /ml Dexamethasone;Moist Heat;Ultrasound;Neuromuscular re-education;Manual techniques;Dry needling;Functional mobility training;Therapeutic activities;Therapeutic exercise    PT Next Visit Plan  add strengthening exercises to HEP.  continue manual work/ PROM.    PT Home Exercise Plan  [email or web address]     Consulted and  Agree with Plan of Care  Patient       Patient will benefit from skilled therapeutic intervention in order to improve the following deficits and impairments:  Pain, Increased muscle spasms, Increased fascial restricitons, Hypomobility, Decreased mobility, Decreased strength, Decreased range of motion, Decreased activity tolerance, Impaired UE functional use  Visit Diagnosis: Acute pain of right shoulder  Scapular dyskinesis  Abnormal posture  Muscle weakness (generalized)  Other symptoms and signs involving the musculoskeletal system     Problem List Patient Active Problem List   Diagnosis Date Noted  . Plantar fasciitis, right 02/19/2019  . Osteopenia 09/02/2018  . Bilateral hearing loss 08/23/2018  . Post-menopausal 08/23/2018  . Tear of right supraspinatus tendon 08/23/2018  . Overweight (BMI 25.0-29.9) 01/27/2017  . Boil, labium 11/22/2016  . Class 1 obesity due to excess calories without serious comorbidity with body mass index (BMI) of 32.0 to 32.9 in adult 07/26/2016  . Primary osteoarthritis of left knee 06/10/2016  . Laceration 06/10/2016  . Elevated LDL cholesterol level 06/10/2016    Emilyann Banka Nilda Simmer PT, MPH  07/26/2019, 11:51 AM  [facility] [place], [place], [address] Phone: [phone]   Fax:  [phone]  Name: Mouna Lahr MRN: [id number] Date of Birth: 1951-08-30

## 2019-07-26 NOTE — Telephone Encounter (Signed)
E.coli found in culture. Sent macrobid for 7 days.

## 2019-07-26 NOTE — Addendum Note (Signed)
Addended by: Donella Stade on: 07/26/2019 07:19 AM   Modules accepted: Orders

## 2019-07-26 NOTE — Patient Instructions (Signed)
Access Code: [email or web address]  URL: [email or web address]  Date: 07/26/2019  Prepared by: Gillermo Murdoch   Exercises  Seated Cervical Retraction - 10 reps - 1 sets - 3x daily - 7x weekly  Standing Scapular Retraction - 10 reps - 1 sets - 10 hold - 3x daily - 7x weekly  Shoulder External Rotation - 5-10 reps - 1 sets - 10 sec hold - 3-4x daily - 7x weekly  Circular Shoulder Pendulum with Table Support - 3-5 reps - 1 sets - 3-4x daily - 7x weekly  Seated Shoulder Flexion AAROM with Pulley Behind - 5-10 reps - 1 sets - 10sec hold - 2x daily - 7x weekly  Seated Shoulder Flexion Towel Slide at Table Top - 10 reps - 1 sets - 10 sec hold - 3-4x daily - 7x weekly  Standing Shoulder Extension with [place] - 3 reps - 1 sets - 30 sec hold - 3x daily - 7x weekly  Standing Backward Shoulder Rolls - 110-15 reps - 1 sets - 3x daily - 7x weekly  Seated Shoulder Scaption AAROM with Pulley at Side - 5-10 reps - 1 sets - 10 sec hold - 2x daily - 7x weekly  Standing Shoulder External Rotation Stretch in Doorway - 10 reps - 1 sets - 10 sec hold - 2x daily - 7x weekly  Isometric Shoulder Abduction at Wall - 10 reps - 1 sets - 10 sec hold - 2x daily - 7x weekly  Isometric Shoulder Extension at Wall - 10 reps - 1 sets - 10 sec hold - 2x daily - 7x weekly  Isometric Shoulder External Rotation with Ball at [employer] - 10 reps - 1 sets - 10 sec hold - 2x daily - 7x weekly  Standing Shoulder and Trunk Flexion at Table - 3 reps - 1 sets - 30 sec hold - 2x daily - 7x weekly  Supine Shoulder Flexion PROM - 10 reps - 1 sets - 10 sec hold - 2x daily - 7x weekly  Scapular Retraction with Resistance - 10 reps - 2-3 sets - 1x daily - 7x weekly  Scapular Retraction with Resistance Advanced - 10 reps - 2-3 sets - 1x daily - 7x weekly  Shoulder External Rotation Reactive Isometrics - 10 reps - 2-3 sets - 2-3sec hold - 1x daily - 7x weekly  Standing Shoulder Scaption - 10 reps - 2-3 sets - 2 sec hold - 2x daily - 7x weekly  Standing  Shoulder Flexion to 90 Degrees - 10 reps - 2-3 sets - 2 sec hold - 2x daily - 7x weekly  Sidelying Shoulder External Rotation - 10 reps - 1 sets - 2 sec hold - 2-3x daily - 7x weekly  Shoulder External Rotation - 3 reps - 1 sets - 30 sec hold - 2x daily - 7x weekly  Shoulder Internal Rotation with Resistance - 3 reps - 1 sets - 30 sec hold - 2x daily - 7x weekly  Standing Wall Ball Circles with Mini Swiss Ball - 20-30 reps - 1 sets - 1x daily - 7x weekly  Standing High Row with Resistance - 10 reps - 1 sets - 2-3 sec hold - 1x daily - 7x weekly

## 2019-07-29 ENCOUNTER — Other Ambulatory Visit: Payer: Self-pay

## 2019-07-29 ENCOUNTER — Ambulatory Visit (INDEPENDENT_AMBULATORY_CARE_PROVIDER_SITE_OTHER): Payer: Medicare HMO | Admitting: Rehabilitative and Restorative Service Providers"

## 2019-07-29 DIAGNOSIS — M25511 Pain in right shoulder: Secondary | ICD-10-CM

## 2019-07-29 DIAGNOSIS — R293 Abnormal posture: Secondary | ICD-10-CM

## 2019-07-29 DIAGNOSIS — M6281 Muscle weakness (generalized): Secondary | ICD-10-CM

## 2019-07-29 DIAGNOSIS — R29898 Other symptoms and signs involving the musculoskeletal system: Secondary | ICD-10-CM

## 2019-07-29 DIAGNOSIS — G2589 Other specified extrapyramidal and movement disorders: Secondary | ICD-10-CM

## 2019-07-29 NOTE — Therapy (Signed)
[place], [place], [address] Phone: [phone]   Fax:  [phone]  Physical Therapy Treatment  Patient Details  Name: Donna [name] MRN: [id number] Date of Birth: 08-04-1951 Referring Provider (PT): Dr Ophelia Charter    Encounter Date: 07/29/2019  PT End of Session - 07/29/19 1119    Visit Number  18    Number of Visits  24    Date for PT Re-Evaluation  08/19/19    PT Start Time  1100    PT Stop Time  1146    PT Time Calculation (min)  46 min    Activity Tolerance  Patient tolerated treatment well       Past Medical History:  Diagnosis Date  . Rosacea   . Tachycardia     Past Surgical History:  Procedure Laterality Date  . dilation curretage    . endometrial tumor    . NOSE SURGERY  1960  . PARTIAL HYSTERECTOMY  1979   removed one tube and one ovary   . SHOULDER ARTHROSCOPY WITH ROTATOR CUFF REPAIR AND SUBACROMIAL DECOMPRESSION Right 04/15/2019   Procedure: RIGHT SHOULDER ARTHROSCOPY WITH ROTATOR CUFF REPAIR AND SUBACROMIAL DECOMPRESSION, DISTAL CLAVICULECTOMY, BICEPS TENODESIS;  Surgeon: Hiram Gash, MD;  Location: [facility];  Service: Orthopedics;  Laterality: Right;  . TONSILLECTOMY  1957    There were no vitals filed for this visit.  Subjective Assessment - 07/29/19 1117    Subjective  Some sense of panic when she starts to come to therapy. Not sure why. OK when she gets here. Feels she needs to continue working on ROM and strength    Currently in Pain?  No/denies                       [facility] Adult PT Treatment/Exercise - 07/29/19 0001      Shoulder Exercises: Pulleys   Flexion  --   10 reps x 10 sec    Scaption  --   10 sec x 10      Shoulder Exercises: Stretch   Wall Stretch - Flexion  5 reps;10 seconds   sliding hand up wall    Other Shoulder Stretches  doorway stretch 30 sec x 2 reps lower and mid positions       Manual Therapy   Manual therapy comments  pt supine     Joint Mobilization  [place] movement     Soft tissue mobilization  soft tissue work for Rt shoulder girdle working through the Public [employer] account executive shoulder/deltoid/biceps/upper trap; teres     Passive ROM  PROM Rt shoulder flexion; scaption; ER; extension; horizontal abduction; extension; horizontal abduction as tolerated  by tissue extensibility and pt tolerance                   PT Long Term Goals - 07/08/19 1107      PT LONG TERM GOAL #1   Title  Improve posture and alignment with patient demonstrating improved upright posture engaging posterior shoulder girdle musculature to promote improved shoulder function    Time  12    Period  Weeks    Status  Revised    Target Date  08/19/19      PT LONG TERM GOAL #2   Title  Increase AROM Rt shoulder to 110-120 degrees flexion and abduction; ER in neutral to 30-35 deg    Time  12    Period  Weeks  Status  Revised    Target Date  08/19/19      PT LONG TERM GOAL #3   Time  12    Period  Weeks    Status  Revised      PT LONG TERM GOAL #4   Title  Independent in HEP    Time  12    Status  Revised    Target Date  08/19/19      PT LONG TERM GOAL #5   Title  Improve FOTO to </=36% limitation    Time  12    Period  Weeks    Status  Revised    Target Date  08/19/19            Plan - 07/29/19 1125    Clinical Impression Statement  Continues to work in clinic and at home on ROM and strength. Patient tolerates PROM better. Reports functional improvement. Can now fasten seatbelt with Rt hand independently.    Stability/Clinical Decision Making  Stable/Uncomplicated    Rehab Potential  Good    PT Frequency  2x / week    PT Duration  6 weeks    PT Treatment/Interventions  Patient/family education;ADLs/Self Care Home Management;Cryotherapy;Electrical Stimulation;Iontophoresis 4mg /ml Dexamethasone;Moist Heat;Ultrasound;Neuromuscular re-education;Manual techniques;Dry needling;Functional mobility  training;Therapeutic activities;Therapeutic exercise    PT Next Visit Plan  add strengthening exercises to HEP.  continue manual work/ PROM.    PT Home Exercise Plan  [email or web address]    Consulted and Agree with Plan of Care  Patient       Patient will benefit from skilled therapeutic intervention in order to improve the following deficits and impairments:  Pain, Increased muscle spasms, Increased fascial restricitons, Hypomobility, Decreased mobility, Decreased strength, Decreased range of motion, Decreased activity tolerance, Impaired UE functional use  Visit Diagnosis: Acute pain of right shoulder  Scapular dyskinesis  Abnormal posture  Muscle weakness (generalized)  Other symptoms and signs involving the musculoskeletal system     Problem List Patient Active Problem List   Diagnosis Date Noted  . Plantar fasciitis, right 02/19/2019  . Osteopenia 09/02/2018  . Bilateral hearing loss 08/23/2018  . Post-menopausal 08/23/2018  . Tear of right supraspinatus tendon 08/23/2018  . Overweight (BMI 25.0-29.9) 01/27/2017  . Boil, labium 11/22/2016  . Class 1 obesity due to excess calories without serious comorbidity with body mass index (BMI) of 32.0 to 32.9 in adult 07/26/2016  . Primary osteoarthritis of left knee 06/10/2016  . Laceration 06/10/2016  . Elevated LDL cholesterol level 06/10/2016     Nilda Simmer PT, MPH  07/29/2019, 11:47 AM  [facility] [place], [place], [address] Phone: [phone]   Fax:  [phone]  Name: Donna [name] MRN: [id number] Date of Birth: March 24, 1951

## 2019-08-02 ENCOUNTER — Ambulatory Visit: Payer: Medicare HMO | Admitting: Physical Therapy

## 2019-08-02 ENCOUNTER — Other Ambulatory Visit: Payer: Self-pay

## 2019-08-02 DIAGNOSIS — R293 Abnormal posture: Secondary | ICD-10-CM | POA: Diagnosis not present

## 2019-08-02 DIAGNOSIS — G2589 Other specified extrapyramidal and movement disorders: Secondary | ICD-10-CM

## 2019-08-02 DIAGNOSIS — M25511 Pain in right shoulder: Secondary | ICD-10-CM

## 2019-08-02 NOTE — Therapy (Signed)
[place], [place], [address] Phone: [phone]   Fax:  [phone]  Physical Therapy Treatment  Patient Details  Name: Donna [name] MRN: [id number] Date of Birth: February 02, 1951 Referring Provider (PT): Dr Ophelia Charter    Encounter Date: 08/02/2019  PT End of Session - 08/02/19 1014    Visit Number  19    Number of Visits  24    Date for PT Re-Evaluation  08/19/19    PT Start Time  [id number]    PT Stop Time  1102    PT Time Calculation (min)  47 min    Activity Tolerance  Patient tolerated treatment well    Behavior During Therapy  [facility] for tasks assessed/performed       Past Medical History:  Diagnosis Date  . Rosacea   . Tachycardia     Past Surgical History:  Procedure Laterality Date  . dilation curretage    . endometrial tumor    . NOSE SURGERY  1960  . PARTIAL HYSTERECTOMY  1979   removed one tube and one ovary   . SHOULDER ARTHROSCOPY WITH ROTATOR CUFF REPAIR AND SUBACROMIAL DECOMPRESSION Right 04/15/2019   Procedure: RIGHT SHOULDER ARTHROSCOPY WITH ROTATOR CUFF REPAIR AND SUBACROMIAL DECOMPRESSION, DISTAL CLAVICULECTOMY, BICEPS TENODESIS;  Surgeon: Hiram Gash, MD;  Location: [place];  Service: Orthopedics;  Laterality: Right;  . TONSILLECTOMY  1957    There were no vitals filed for this visit.  Subjective Assessment - 08/02/19 1015    Subjective  Patient still expressing concern re: decreased flexion of right vs. left.    Patient Stated Goals  to be able to use arm and shoulder again    Currently in Pain?  No/denies                       [facility] Adult PT Treatment/Exercise - 08/02/19 0001      Shoulder Exercises: Supine   Other Supine Exercises  PNF 2x10 left shoulder D2; 1x10 D1      Shoulder Exercises: Pulleys   Flexion  --   10 reps x 10 sec    Scaption  --   10 sec x 10      Shoulder Exercises: Stretch   Wall Stretch - Flexion  5  reps;10 seconds   sliding hand up wall    Other Shoulder Stretches  doorway stretch 30 sec x 2 reps lower and mid positions       Manual Therapy   Manual therapy comments  pt supine     Soft tissue mobilization  to left pectorals and subscapularis    Passive ROM  PROM to right shoulder into flex/scaption/ER/ horizontal ABD             PT Education - 08/02/19 1254    Education Details  HEP - discussed the use of rice bag to add weight to ER/flex and scaption stretch. Encouraged reaching higher at cabinets without weight to gain end range strength.    Person(s) Educated  Patient    Methods  Explanation;Demonstration    Comprehension  Verbalized understanding;Returned demonstration          PT Long Term Goals - 07/08/19 1107      PT LONG TERM GOAL #1   Title  Improve posture and alignment with patient demonstrating improved upright posture engaging posterior shoulder girdle musculature to promote improved shoulder function    Time  12  Period  Weeks    Status  Revised    Target Date  08/19/19      PT LONG TERM GOAL #2   Title  Increase AROM Rt shoulder to 110-120 degrees flexion and abduction; ER in neutral to 30-35 deg    Time  12    Period  Weeks    Status  Revised    Target Date  08/19/19      PT LONG TERM GOAL #3   Time  12    Period  Weeks    Status  Revised      PT LONG TERM GOAL #4   Title  Independent in HEP    Time  12    Status  Revised    Target Date  08/19/19      PT LONG TERM GOAL #5   Title  Improve FOTO to </=36% limitation    Time  12    Period  Weeks    Status  Revised    Target Date  08/19/19            Plan - 08/02/19 1250    Clinical Impression Statement  Patient continues to make small gains. She reports improvement with don/doff bra today. Tolerated prolonged stretches well today.    PT Frequency  2x / week    PT Duration  6 weeks    PT Treatment/Interventions  Patient/family education;ADLs/Self Care Home  Management;Cryotherapy;Electrical Stimulation;Iontophoresis 4mg /ml Dexamethasone;Moist Heat;Ultrasound;Neuromuscular re-education;Manual techniques;Dry needling;Functional mobility training;Therapeutic activities;Therapeutic exercise    PT Next Visit Plan  continue manual work/ PROM.       Patient will benefit from skilled therapeutic intervention in order to improve the following deficits and impairments:  Pain, Increased muscle spasms, Increased fascial restricitons, Hypomobility, Decreased mobility, Decreased strength, Decreased range of motion, Decreased activity tolerance, Impaired UE functional use  Visit Diagnosis: Acute pain of right shoulder  Scapular dyskinesis  Abnormal posture     Problem List Patient Active Problem List   Diagnosis Date Noted  . Plantar fasciitis, right 02/19/2019  . Osteopenia 09/02/2018  . Bilateral hearing loss 08/23/2018  . Post-menopausal 08/23/2018  . Tear of right supraspinatus tendon 08/23/2018  . Overweight (BMI 25.0-29.9) 01/27/2017  . Boil, labium 11/22/2016  . Class 1 obesity due to excess calories without serious comorbidity with body mass index (BMI) of 32.0 to 32.9 in adult 07/26/2016  . Primary osteoarthritis of left knee 06/10/2016  . Laceration 06/10/2016  . Elevated LDL cholesterol level 06/10/2016    Madelyn Flavors PT 08/02/2019, 12:57 PM  [facility] [place], [place], [address] Phone: [phone]   Fax:  [phone]  Name: Donna [name] MRN: [id number] Date of Birth: 05-17-1951

## 2019-08-05 ENCOUNTER — Other Ambulatory Visit: Payer: Self-pay

## 2019-08-05 ENCOUNTER — Ambulatory Visit: Payer: Medicare HMO | Admitting: Rehabilitative and Restorative Service Providers"

## 2019-08-05 ENCOUNTER — Encounter: Payer: Self-pay | Admitting: Rehabilitative and Restorative Service Providers"

## 2019-08-05 DIAGNOSIS — M25511 Pain in right shoulder: Secondary | ICD-10-CM

## 2019-08-05 DIAGNOSIS — R29898 Other symptoms and signs involving the musculoskeletal system: Secondary | ICD-10-CM | POA: Diagnosis not present

## 2019-08-05 DIAGNOSIS — G2589 Other specified extrapyramidal and movement disorders: Secondary | ICD-10-CM

## 2019-08-05 DIAGNOSIS — M6281 Muscle weakness (generalized): Secondary | ICD-10-CM | POA: Diagnosis not present

## 2019-08-05 DIAGNOSIS — R293 Abnormal posture: Secondary | ICD-10-CM | POA: Diagnosis not present

## 2019-08-05 NOTE — Therapy (Addendum)
[place], [place], [address] Phone: [phone]   Fax:  [phone]  Physical Therapy Treatment  Patient Details  Name: Donna [name] MRN: [id number] Date of Birth: 06-Jan-1951 Referring Provider (PT): Dr Ophelia Charter    Encounter Date: 08/05/2019  PT End of Session - 08/05/19 1017    Visit Number  20    Number of Visits  24    Date for PT Re-Evaluation  08/19/19    PT Start Time  1016    PT Stop Time  1100    PT Time Calculation (min)  44 min    Activity Tolerance  Patient tolerated treatment well       Past Medical History:  Diagnosis Date  . Rosacea   . Tachycardia     Past Surgical History:  Procedure Laterality Date  . dilation curretage    . endometrial tumor    . NOSE SURGERY  1960  . PARTIAL HYSTERECTOMY  1979   removed one tube and one ovary   . SHOULDER ARTHROSCOPY WITH ROTATOR CUFF REPAIR AND SUBACROMIAL DECOMPRESSION Right 04/15/2019   Procedure: RIGHT SHOULDER ARTHROSCOPY WITH ROTATOR CUFF REPAIR AND SUBACROMIAL DECOMPRESSION, DISTAL CLAVICULECTOMY, BICEPS TENODESIS;  Surgeon: Hiram Gash, MD;  Location: [place];  Service: Orthopedics;  Laterality: Right;  . TONSILLECTOMY  1957    There were no vitals filed for this visit.  Subjective Assessment - 08/05/19 1019    Subjective  No pain - only has pain or discomfort with stretching.    Currently in Pain?  No/denies         [facility] PT Assessment - 08/05/19 0001      Assessment   Medical Diagnosis  Rt shoulder RCR; DCR; SAD     Referring Provider (PT)  Dr Ophelia Charter     Onset Date/Surgical Date  04/15/19    Hand Dominance  Right    Next MD Visit  08/19/2019    Prior Therapy  here for Lt knee 3 visits 2017      Observation/Other Assessments   Focus on Therapeutic Outcomes (FOTO)   42% limitation       AROM   Right Shoulder Extension  46 Degrees    Right Shoulder Flexion  115 Degrees    Right  Shoulder ABduction  100 Degrees    Right Shoulder Internal Rotation  --   hand to sacrum   Right Shoulder External Rotation  30 Degrees   arm in neurtal elbow bent to 90 deg flexion    Left Shoulder Extension  45 Degrees    Left Shoulder Flexion  133 Degrees    Left Shoulder ABduction  120 Degrees                   [facility] Adult PT Treatment/Exercise - 08/05/19 0001      Shoulder Exercises: Standing   External Rotation  AROM;Strengthening;Both;20 reps;Weights   standing with 2# wts - scap retraction w/ER    Flexion  Strengthening;Both;15 reps;Weights    Shoulder Flexion Weight (lbs)  2    ABduction  Strengthening;Both;20 reps;Weights   scaption    Shoulder ABduction Weight (lbs)  2      Shoulder Exercises: Pulleys   Flexion  --   10 reps x 10 sec    Scaption  --   10 sec x 10      Shoulder Exercises: ROM/Strengthening   Other ROM/Strengthening Exercises  wall push up x  10; reverse push up x 10       Shoulder Exercises: Stretch   Other Shoulder Stretches  doorway stretch 3 positions 30 sec x 3 reps lower two positions and 2 reps higher positions       Manual Therapy   Manual therapy comments  pt supine     Joint Mobilization  GH movement     Soft tissue mobilization  soft tissue work through t the [employer]; upper trap; leveator; anterior shoulder     Passive ROM  PROM to right shoulder into flex/scaption/ER/ horizontal ABD             PT Education - 08/05/19 1250    Education Details  HEP    Person(s) Educated  Patient    Methods  Explanation;Demonstration;Tactile cues;Verbal cues;Handout    Comprehension  Verbalized understanding;Returned demonstration;Verbal cues required;Tactile cues required          PT Long Term Goals - 07/08/19 1107      PT LONG TERM GOAL #1   Title  Improve posture and alignment with patient demonstrating improved upright posture engaging posterior shoulder girdle musculature to promote improved shoulder function    Time   12    Period  Weeks    Status  Revised    Target Date  08/19/19      PT LONG TERM GOAL #2   Title  Increase AROM Rt shoulder to 110-120 degrees flexion and abduction; ER in neutral to 30-35 deg    Time  12    Period  Weeks    Status  Revised    Target Date  08/19/19      PT LONG TERM GOAL #3   Time  12    Period  Weeks    Status  Revised      PT LONG TERM GOAL #4   Title  Independent in HEP    Time  12    Status  Revised    Target Date  08/19/19      PT LONG TERM GOAL #5   Title  Improve FOTO to </=36% limitation    Time  12    Period  Weeks    Status  Revised    Target Date  08/19/19            Plan - 08/05/19 1017    Clinical Impression Statement  Patient continues to progress with functional activities. She is gradually improving in ROM and strenght Rt UE. Reviewed all exercises and grouped exercises for home to prepare for discharge in the next 2-3 visits.    PT Frequency  2x / week    PT Duration  6 weeks    PT Treatment/Interventions  Patient/family education;ADLs/Self Care Home Management;Cryotherapy;Electrical Stimulation;Iontophoresis 4mg /ml Dexamethasone;Moist Heat;Ultrasound;Neuromuscular re-education;Manual techniques;Dry needling;Functional mobility training;Therapeutic activities;Therapeutic exercise    PT Next Visit Plan  continue manual work/ PROM.- progress to independent HEP    PT Home Exercise Plan  [email or web address]       Patient will benefit from skilled therapeutic intervention in order to improve the following deficits and impairments:  Pain, Increased muscle spasms, Increased fascial restricitons, Hypomobility, Decreased mobility, Decreased strength, Decreased range of motion, Decreased activity tolerance, Impaired UE functional use  Visit Diagnosis: Acute pain of right shoulder  Scapular dyskinesis  Abnormal posture  Muscle weakness (generalized)  Other symptoms and signs involving the musculoskeletal system     Problem List Patient  Active Problem List   Diagnosis Date Noted  . Plantar  fasciitis, right 02/19/2019  . Osteopenia 09/02/2018  . Bilateral hearing loss 08/23/2018  . Post-menopausal 08/23/2018  . Tear of right supraspinatus tendon 08/23/2018  . Overweight (BMI 25.0-29.9) 01/27/2017  . Boil, labium 11/22/2016  . Class 1 obesity due to excess calories without serious comorbidity with body mass index (BMI) of 32.0 to 32.9 in adult 07/26/2016  . Primary osteoarthritis of left knee 06/10/2016  . Laceration 06/10/2016  . Elevated LDL cholesterol level 06/10/2016    Izeah Vossler Nilda Simmer PT, MPH  08/05/2019, 12:59 PM  [facility] [place], [place], [address] Phone: [phone]   Fax:  [phone]  Name: Donna [name] MRN: [id number] Date of Birth: 14-Dec-1950

## 2019-08-05 NOTE — Patient Instructions (Signed)
Access Code: [email or web address]  URL: [email or web address]  Date: 08/05/2019  Prepared by: Gillermo Murdoch   Exercises  Seated Cervical Retraction - 10 reps - 1 sets - 3x daily - 7x weekly  Standing Scapular Retraction - 10 reps - 1 sets - 10 hold - 3x daily - 7x weekly  Shoulder External Rotation - 5-10 reps - 1 sets - 10 sec hold - 3-4x daily - 7x weekly  Circular Shoulder Pendulum with Table Support - 3-5 reps - 1 sets - 3-4x daily - 7x weekly  Seated Shoulder Flexion AAROM with Pulley Behind - 5-10 reps - 1 sets - 10sec hold - 2x daily - 7x weekly  Seated Shoulder Flexion Towel Slide at Table Top - 10 reps - 1 sets - 10 sec hold - 3-4x daily - 7x weekly  Standing Shoulder Extension with Dowel - 3 reps - 1 sets - 30 sec hold - 3x daily - 7x weekly  Standing Backward Shoulder Rolls - 110-15 reps - 1 sets - 3x daily - 7x weekly  Seated Shoulder Scaption AAROM with Pulley at Side - 5-10 reps - 1 sets - 10 sec hold - 2x daily - 7x weekly  Standing Shoulder External Rotation Stretch in Doorway - 10 reps - 1 sets - 10 sec hold - 2x daily - 7x weekly  Isometric Shoulder Abduction at Wall - 10 reps - 1 sets - 10 sec hold - 2x daily - 7x weekly  Isometric Shoulder Extension at Wall - 10 reps - 1 sets - 10 sec hold - 2x daily - 7x weekly  Isometric Shoulder External Rotation with Ball at [employer] - 10 reps - 1 sets - 10 sec hold - 2x daily - 7x weekly  Standing Shoulder and Trunk Flexion at Table - 3 reps - 1 sets - 30 sec hold - 2x daily - 7x weekly  Supine Shoulder Flexion PROM - 10 reps - 1 sets - 10 sec hold - 2x daily - 7x weekly  Scapular Retraction with Resistance - 10 reps - 2-3 sets - 1x daily - 7x weekly  Scapular Retraction with Resistance Advanced - 10 reps - 2-3 sets - 1x daily - 7x weekly  Shoulder External Rotation Reactive Isometrics - 10 reps - 2-3 sets - 2-3sec hold - 1x daily - 7x weekly  Standing Shoulder Scaption - 10 reps - 2-3 sets - 2 sec hold - 2x daily - 7x weekly  Standing  Shoulder Flexion to 90 Degrees - 10 reps - 2-3 sets - 2 sec hold - 2x daily - 7x weekly  Sidelying Shoulder External Rotation - 10 reps - 1 sets - 2 sec hold - 2-3x daily - 7x weekly  Shoulder External Rotation - 3 reps - 1 sets - 30 sec hold - 2x daily - 7x weekly  Shoulder Internal Rotation with Resistance - 3 reps - 1 sets - 30 sec hold - 2x daily - 7x weekly  Standing Wall Ball Circles with Mini Swiss Ball - 20-30 reps - 1 sets - 1x daily - 7x weekly  Standing High Row with Resistance - 10 reps - 1-2 sets - 2-3 sec hold - 1x daily - 7x weekly  Wall Push Up - 10 reps - 1-2 sets - 2-3 sec hold - 1x daily - 7x weekly  Childs Pose - 3 reps - 1 sets - 30 sec hold - 2x daily - 7x weekly

## 2019-08-11 ENCOUNTER — Other Ambulatory Visit: Payer: Self-pay

## 2019-08-11 ENCOUNTER — Encounter: Payer: Self-pay | Admitting: Rehabilitative and Restorative Service Providers"

## 2019-08-11 ENCOUNTER — Ambulatory Visit (INDEPENDENT_AMBULATORY_CARE_PROVIDER_SITE_OTHER): Payer: Medicare HMO | Admitting: Rehabilitative and Restorative Service Providers"

## 2019-08-11 DIAGNOSIS — G2589 Other specified extrapyramidal and movement disorders: Secondary | ICD-10-CM

## 2019-08-11 DIAGNOSIS — M6281 Muscle weakness (generalized): Secondary | ICD-10-CM | POA: Diagnosis not present

## 2019-08-11 DIAGNOSIS — M25511 Pain in right shoulder: Secondary | ICD-10-CM | POA: Diagnosis not present

## 2019-08-11 DIAGNOSIS — R293 Abnormal posture: Secondary | ICD-10-CM | POA: Diagnosis not present

## 2019-08-11 DIAGNOSIS — R29898 Other symptoms and signs involving the musculoskeletal system: Secondary | ICD-10-CM | POA: Diagnosis not present

## 2019-08-11 NOTE — Patient Instructions (Signed)
Access Code: [email or web address]  URL: [email or web address]  Date: 08/11/2019  Prepared by: Gillermo Murdoch   Exercises  Hooklying Shoulder Flexion with Resistance - 10 reps - 1-3 sets - 1x daily - 7x weekly  Standing Shoulder Diagonal Horizontal Abduction 60/120 Degrees with Resistance - 10 reps - 1-3 sets - 1x daily - 7x weekly  Sidelying Shoulder Abduction Full Range of Motion - 3 reps - 1 sets - 30 seconds hold - 2x daily - 7x weekly   hand resting on hip to work IR

## 2019-08-11 NOTE — Therapy (Signed)
[place], [place], [address] Phone: [phone]   Fax:  [phone]  Physical Therapy Treatment  Patient Details  Name: Donna [name] MRN: [id number] Date of Birth: 11/08/1951 Referring Provider (PT): Dr Ophelia Charter    Encounter Date: 08/11/2019  PT End of Session - 08/11/19 1349    Visit Number  21    Number of Visits  24    Date for PT Re-Evaluation  08/19/19    PT Start Time  [id number]    PT Stop Time  1429    PT Time Calculation (min)  44 min    Activity Tolerance  Patient tolerated treatment well       Past Medical History:  Diagnosis Date  . Rosacea   . Tachycardia     Past Surgical History:  Procedure Laterality Date  . dilation curretage    . endometrial tumor    . NOSE SURGERY  1960  . PARTIAL HYSTERECTOMY  1979   removed one tube and one ovary   . SHOULDER ARTHROSCOPY WITH ROTATOR CUFF REPAIR AND SUBACROMIAL DECOMPRESSION Right 04/15/2019   Procedure: RIGHT SHOULDER ARTHROSCOPY WITH ROTATOR CUFF REPAIR AND SUBACROMIAL DECOMPRESSION, DISTAL CLAVICULECTOMY, BICEPS TENODESIS;  Surgeon: Hiram Gash, MD;  Location: [place];  Service: Orthopedics;  Laterality: Right;  . TONSILLECTOMY  1957    There were no vitals filed for this visit.  Subjective Assessment - 08/11/19 1351    Subjective  Patient reports that her shoulder is doing well. No pain. Working on her exercises at home. Reaching higher with wall slide and with assist - still can't lift a can overhead. She can lie on the Rt side.    Currently in Pain?  No/denies                       [facility] Adult PT Treatment/Exercise - 08/11/19 0001      Shoulder Exercises: Supine   Flexion  Strengthening;Right;10 reps;Theraband      Shoulder Exercises: Sidelying   Other Sidelying Exercises  shoulder abduction stretching Rt arm overhead for prolonged stretch 30-45 sec x 3 reps     Other Sidelying Exercises   IR stretch with Rt hand resting on Rt hip elbow dropping toward floor 30-45 sec x 3 reps       Shoulder Exercises: Standing   Flexion Limitations  wall slide holding Rt UE in flexion at end range slowly eccentrically lowering UE to side x 5 reps       Shoulder Exercises: Pulleys   Flexion  --   10 reps x 10 sec    Scaption  --   10 sec x 10    Other Pulley Exercises  eccentric lowering with pulley to work on strength       Manual Therapy   Manual therapy comments  pt supine and Lt sidelying     Joint Mobilization  GH movement     Soft tissue mobilization  soft tissue work through t the Rt teres; pecs; upper trap; leveator; anterior shoulder     Passive ROM  PROM to right shoulder into flex/scaption/ER/ horizontal ABD/IR rotation in sidelying              PT Education - 08/11/19 1431    Education Details  HEP    Person(s) Educated  Patient    Methods  Explanation;Demonstration;Tactile cues;Verbal cues;Handout    Comprehension  Verbalized understanding;Returned demonstration;Verbal cues required;Tactile cues  required          PT Long Term Goals - 07/08/19 1107      PT LONG TERM GOAL #1   Title  Improve posture and alignment with patient demonstrating improved upright posture engaging posterior shoulder girdle musculature to promote improved shoulder function    Time  12    Period  Weeks    Status  Revised    Target Date  08/19/19      PT LONG TERM GOAL #2   Title  Increase AROM Rt shoulder to 110-120 degrees flexion and abduction; ER in neutral to 30-35 deg    Time  12    Period  Weeks    Status  Revised    Target Date  08/19/19      PT LONG TERM GOAL #3   Time  12    Period  Weeks    Status  Revised      PT LONG TERM GOAL #4   Title  Independent in HEP    Time  12    Status  Revised    Target Date  08/19/19      PT LONG TERM GOAL #5   Title  Improve FOTO to </=36% limitation    Time  12    Period  Weeks    Status  Revised    Target Date  08/19/19             Plan - 08/11/19 1351    Clinical Impression Statement  Improving functional activities. Added passive IR and abduction in sidelying. Added strengthening for end range flexion in standing and supine. Continued work on PROM and stretching by PT to gain increased ROM/mobility.    PT Frequency  2x / week    PT Duration  6 weeks    PT Treatment/Interventions  Patient/family education;ADLs/Self Care Home Management;Cryotherapy;Electrical Stimulation;Iontophoresis 4mg /ml Dexamethasone;Moist Heat;Ultrasound;Neuromuscular re-education;Manual techniques;Dry needling;Functional mobility training;Therapeutic activities;Therapeutic exercise    PT Next Visit Plan  continue manual work/ PROM.- progress to independent HEP    PT Home Exercise Plan  [email or web address]    Consulted and Agree with Plan of Care  Patient       Patient will benefit from skilled therapeutic intervention in order to improve the following deficits and impairments:  Pain, Increased muscle spasms, Increased fascial restricitons, Hypomobility, Decreased mobility, Decreased strength, Decreased range of motion, Decreased activity tolerance, Impaired UE functional use  Visit Diagnosis: Acute pain of right shoulder  Scapular dyskinesis  Abnormal posture  Muscle weakness (generalized)  Other symptoms and signs involving the musculoskeletal system     Problem List Patient Active Problem List   Diagnosis Date Noted  . Plantar fasciitis, right 02/19/2019  . Osteopenia 09/02/2018  . Bilateral hearing loss 08/23/2018  . Post-menopausal 08/23/2018  . Tear of right supraspinatus tendon 08/23/2018  . Overweight (BMI 25.0-29.9) 01/27/2017  . Boil, labium 11/22/2016  . Class 1 obesity due to excess calories without serious comorbidity with body mass index (BMI) of 32.0 to 32.9 in adult 07/26/2016  . Primary osteoarthritis of left knee 06/10/2016  . Laceration 06/10/2016  . Elevated LDL cholesterol level 06/10/2016    Miyana Mordecai Nilda Simmer PT, MPH  08/11/2019, 3:00 PM  [facility] [place], [place], [address] Phone: [phone]   Fax:  [phone]  Name: Donna [name] MRN: [id number] Date of Birth: 12-23-1950

## 2019-08-18 ENCOUNTER — Ambulatory Visit (INDEPENDENT_AMBULATORY_CARE_PROVIDER_SITE_OTHER): Payer: Medicare HMO | Admitting: Rehabilitative and Restorative Service Providers"

## 2019-08-18 ENCOUNTER — Other Ambulatory Visit: Payer: Self-pay

## 2019-08-18 ENCOUNTER — Encounter: Payer: Self-pay | Admitting: Rehabilitative and Restorative Service Providers"

## 2019-08-18 DIAGNOSIS — R29898 Other symptoms and signs involving the musculoskeletal system: Secondary | ICD-10-CM | POA: Diagnosis not present

## 2019-08-18 DIAGNOSIS — M6281 Muscle weakness (generalized): Secondary | ICD-10-CM

## 2019-08-18 DIAGNOSIS — G2589 Other specified extrapyramidal and movement disorders: Secondary | ICD-10-CM | POA: Diagnosis not present

## 2019-08-18 DIAGNOSIS — M25511 Pain in right shoulder: Secondary | ICD-10-CM

## 2019-08-18 DIAGNOSIS — R293 Abnormal posture: Secondary | ICD-10-CM | POA: Diagnosis not present

## 2019-08-18 NOTE — Therapy (Signed)
[place], [place], [address] Phone: [phone]   Fax:  [phone]  Physical Therapy Treatment  Patient Details  Name: Jaquilla Woodroof MRN: [id number] Date of Birth: Nov 29, 1951 Referring Provider (PT): Dr Ophelia Charter    Encounter Date: 08/18/2019  PT End of Session - 08/18/19 1348    Visit Number  22    Number of Visits  24    Date for PT Re-Evaluation  08/19/19    PT Start Time  1346    PT Stop Time  1430    PT Time Calculation (min)  44 min    Activity Tolerance  Patient tolerated treatment well       Past Medical History:  Diagnosis Date  . Rosacea   . Tachycardia     Past Surgical History:  Procedure Laterality Date  . dilation curretage    . endometrial tumor    . NOSE SURGERY  1960  . PARTIAL HYSTERECTOMY  1979   removed one tube and one ovary   . SHOULDER ARTHROSCOPY WITH ROTATOR CUFF REPAIR AND SUBACROMIAL DECOMPRESSION Right 04/15/2019   Procedure: RIGHT SHOULDER ARTHROSCOPY WITH ROTATOR CUFF REPAIR AND SUBACROMIAL DECOMPRESSION, DISTAL CLAVICULECTOMY, BICEPS TENODESIS;  Surgeon: Hiram Gash, MD;  Location: [facility];  Service: Orthopedics;  Laterality: Right;  . TONSILLECTOMY  1957    There were no vitals filed for this visit.  Subjective Assessment - 08/18/19 1352    Subjective  Patient reports that she is working on her exercises 3 times/day most days. She wants to know which exercises she needs to continue with and how often she needs to do the exercises.    Currently in Pain?  No/denies    Pain Score  0-No pain         [facility] PT Assessment - 08/18/19 0001      Assessment   Medical Diagnosis  Rt shoulder RCR; DCR; SAD     Referring Provider (PT)  Dr Ophelia Charter     Onset Date/Surgical Date  04/15/19    Hand Dominance  Right    Next MD Visit  08/19/2019    Prior Therapy  here for Lt knee 3 visits 2017      Observation/Other Assessments   Focus on  Therapeutic Outcomes (FOTO)   38% limitation       AROM   Right Shoulder Extension  47 Degrees    Right Shoulder Flexion  124 Degrees    Right Shoulder ABduction  120 Degrees    Right Shoulder Internal Rotation  --   hand to waist   Right Shoulder External Rotation  68 Degrees   arm in neurtal elbow bent to 90 deg flexion    Left Shoulder Extension  45 Degrees    Left Shoulder Flexion  133 Degrees    Left Shoulder ABduction  120 Degrees      PROM   Right Shoulder Extension  58 Degrees    Right Shoulder Flexion  165 Degrees    Right Shoulder ABduction  160 Degrees    Right Shoulder Internal Rotation  72 Degrees    Right Shoulder External Rotation  85 Degrees      Strength   Right Shoulder Flexion  4+/5    Right Shoulder Extension  5/5    Right Shoulder ABduction  4+/5    Right Shoulder Internal Rotation  5/5    Right Shoulder External Rotation  --   5-/5  Right Shoulder Horizontal ABduction  --   5-/5      Palpation   Palpation comment  mild muscuar tightness Rt upper quarter - pecs, upper trap, leveator, deltoid, biceps                    [facility] Adult PT Treatment/Exercise - 08/18/19 0001      Shoulder Exercises: Supine   Flexion  Strengthening;Right;10 reps;Theraband      Shoulder Exercises: Standing   Flexion  Strengthening;Both;15 reps;Weights    Shoulder Flexion Weight (lbs)  2    Flexion Limitations  wall slide holding Rt UE in flexion at end range slowly eccentrically lowering UE to side x 5 reps     ABduction  Strengthening;Both;20 reps;Weights    Shoulder ABduction Weight (lbs)  2    Extension  Strengthening;Both;20 reps;Theraband    Theraband Level (Shoulder Extension)  Level 3 (Green)    Row  Strengthening;Both;10 reps    Theraband Level (Shoulder Row)  Level 3 (Green)    Row Limitations  bow and arrow step back x 10 reps green TB; row at 45 deg x 10 reps green TB     Other Standing Exercises  standing working on ER with ball on wall     Other  Standing Exercises  wall push yup x 10 x 2 sets; reverse wall push up x 10 x 2 sets       Shoulder Exercises: Pulleys   Flexion  --   10 reps x 10 sec    Scaption  --   10 sec x 10    Other Pulley Exercises  eccentric lowering with pulley to work on strength       Shoulder Exercises: Stretch   Other Shoulder Stretches  doorway stretch 3 positions 30 sec x 3 reps lower two positions and 2 reps higher positions     External Rotation Stretch Limitations  hands behind head with support for Rt elbow 20-30 sec stretch x 3 reps       Manual Therapy   Manual therapy comments  pt supine and Lt sidelying     Joint Mobilization  GH movement     Soft tissue mobilization  soft tissue work through t the Rt teres; pecs; upper trap; leveator; anterior shoulder     Passive ROM  PROM to right shoulder into flex/scaption/ER/ horizontal ABD/IR rotation in sidelying                   PT Long Term Goals - 08/18/19 1433      PT LONG TERM GOAL #1   Title  Improve posture and alignment with patient demonstrating improved upright posture engaging posterior shoulder girdle musculature to promote improved shoulder function    Time  12    Period  Weeks    Status  Achieved      PT LONG TERM GOAL #2   Title  Increase AROM Rt shoulder to 110-120 degrees flexion and abduction; ER in neutral to 30-35 deg    Time  12    Period  Weeks    Status  Achieved      PT LONG TERM GOAL #3   Title  Patient reports ability use Rt UE functionally for activities like reaching to shoulder height shelf, washing face, driving car with minimal to no difficulty    Time  12    Period  Weeks    Status  Achieved      PT LONG  TERM GOAL #4   Title  Independent in HEP    Time  12    Period  Weeks    Status  Achieved      PT LONG TERM GOAL #5   Title  Improve FOTO to </=36% limitation - 38% limitation 08/18/2019    Time  12    Period  Weeks    Status  Partially Met            Plan - 08/18/19 1350     Clinical Impression Statement  Excellent progress with shoulder rehab. Patient demonstrates good gains in ROM/mobility and strength Rt UE. She has returned to normal functional activities and has no pain. Patient is independent in HEP and confident in continuing with HEP.    PT Frequency  2x / week    PT Duration  6 weeks    PT Treatment/Interventions  Patient/family education;ADLs/Self Care Home Management;Cryotherapy;Electrical Stimulation;Iontophoresis 30m/ml Dexamethasone;Moist Heat;Ultrasound;Neuromuscular re-education;Manual techniques;Dry needling;Functional mobility training;Therapeutic activities;Therapeutic exercise    PT Next Visit Plan  d/c to independent HEP - pt will call with any questions ofr problems    PT Home Exercise Plan  [id number]    Consulted and Agree with Plan of Care  Patient       Patient will benefit from skilled therapeutic intervention in order to improve the following deficits and impairments:  Pain, Increased muscle spasms, Increased fascial restricitons, Hypomobility, Decreased mobility, Decreased strength, Decreased range of motion, Decreased activity tolerance, Impaired UE functional use  Visit Diagnosis: Acute pain of right shoulder  Scapular dyskinesis  Abnormal posture  Muscle weakness (generalized)  Other symptoms and signs involving the musculoskeletal system     Problem List Patient Active Problem List   Diagnosis Date Noted  . Plantar fasciitis, right 02/19/2019  . Osteopenia 09/02/2018  . Bilateral hearing loss 08/23/2018  . Post-menopausal 08/23/2018  . Tear of right supraspinatus tendon 08/23/2018  . Overweight (BMI 25.0-29.9) 01/27/2017  . Boil, labium 11/22/2016  . Class 1 obesity due to excess calories without serious comorbidity with body mass index (BMI) of 32.0 to 32.9 in adult 07/26/2016  . Primary osteoarthritis of left knee 06/10/2016  . Laceration 06/10/2016  . Elevated LDL cholesterol level 06/10/2016    Celyn PNilda SimmerPT,  MPH  08/18/2019, 2:35 PM  [facility] [address] [place] [address]Phone: [phone]  Fax:  [phone] Name: RWeslynn KeMRN: [id number] of Birth: 111-Dec-1952 PHYSICAL THERAPY DISCHARGE SUMMARY  Visits from Start of Care: 22  Current functional level related to goals / functional outcomes: See progress note    Remaining deficits: Needs to continue HEP    Education / Equipment: HEP  Plan: Patient agrees to discharge.  Patient goals were partially met. Patient is being discharged due to being pleased with the current functional level.  ?????     Celyn P. HHelene KelpPT, MPH 08/18/19 2:36 PM

## 2019-08-19 DIAGNOSIS — M19011 Primary osteoarthritis, right shoulder: Secondary | ICD-10-CM | POA: Diagnosis not present

## 2019-08-29 IMAGING — DX RIGHT OS CALCIS - 2+ VIEW
2 series · 2 of 2 positions shown · non-contrast
Comparison: None.

CLINICAL DATA: Calcaneal pain, no acute injury, initial encounter

EXAM:
RIGHT OS CALCIS - 2+ VIEW

[calcaneus axial]
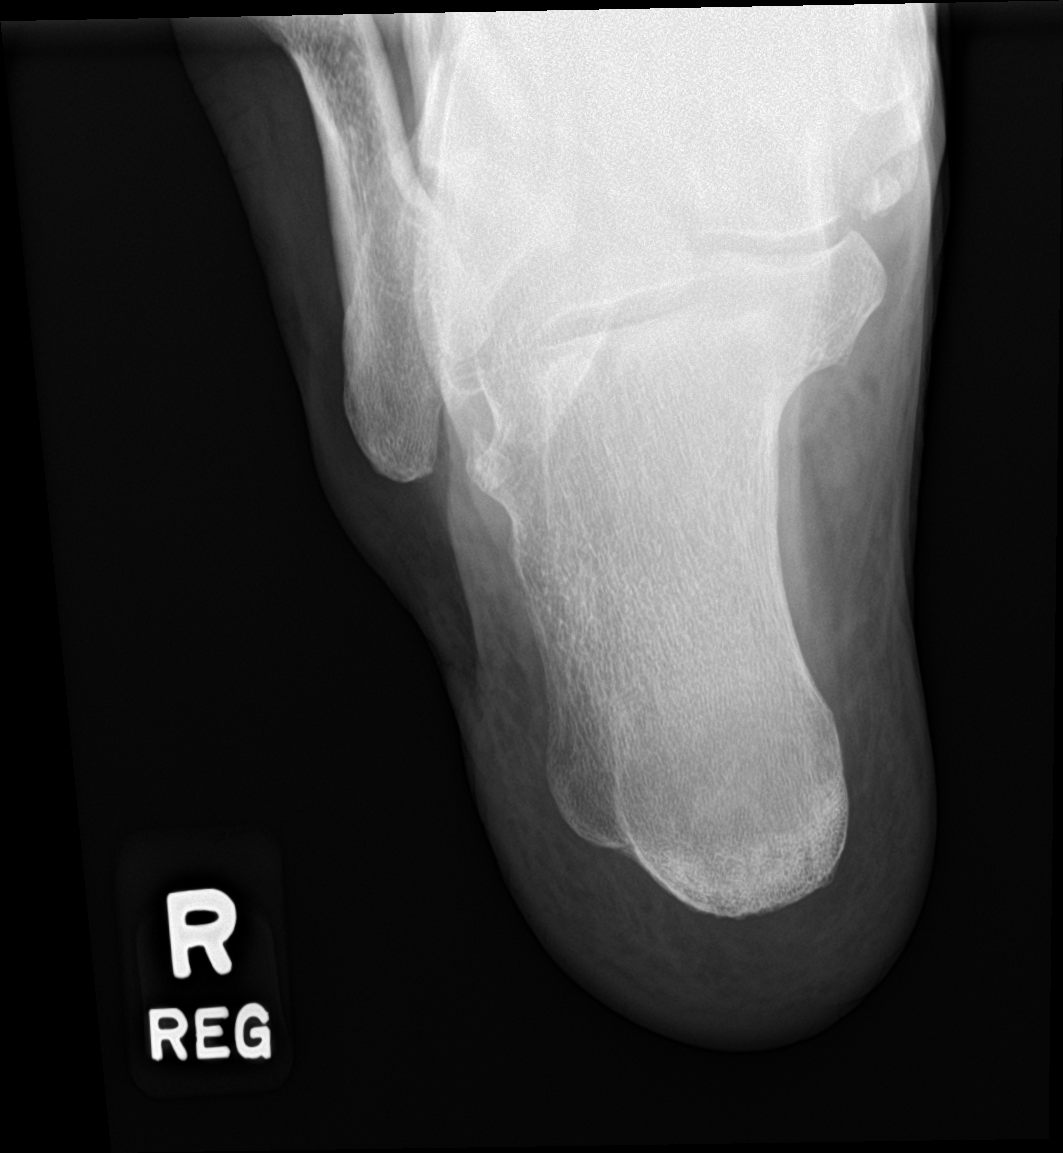

[calcaneus lat]
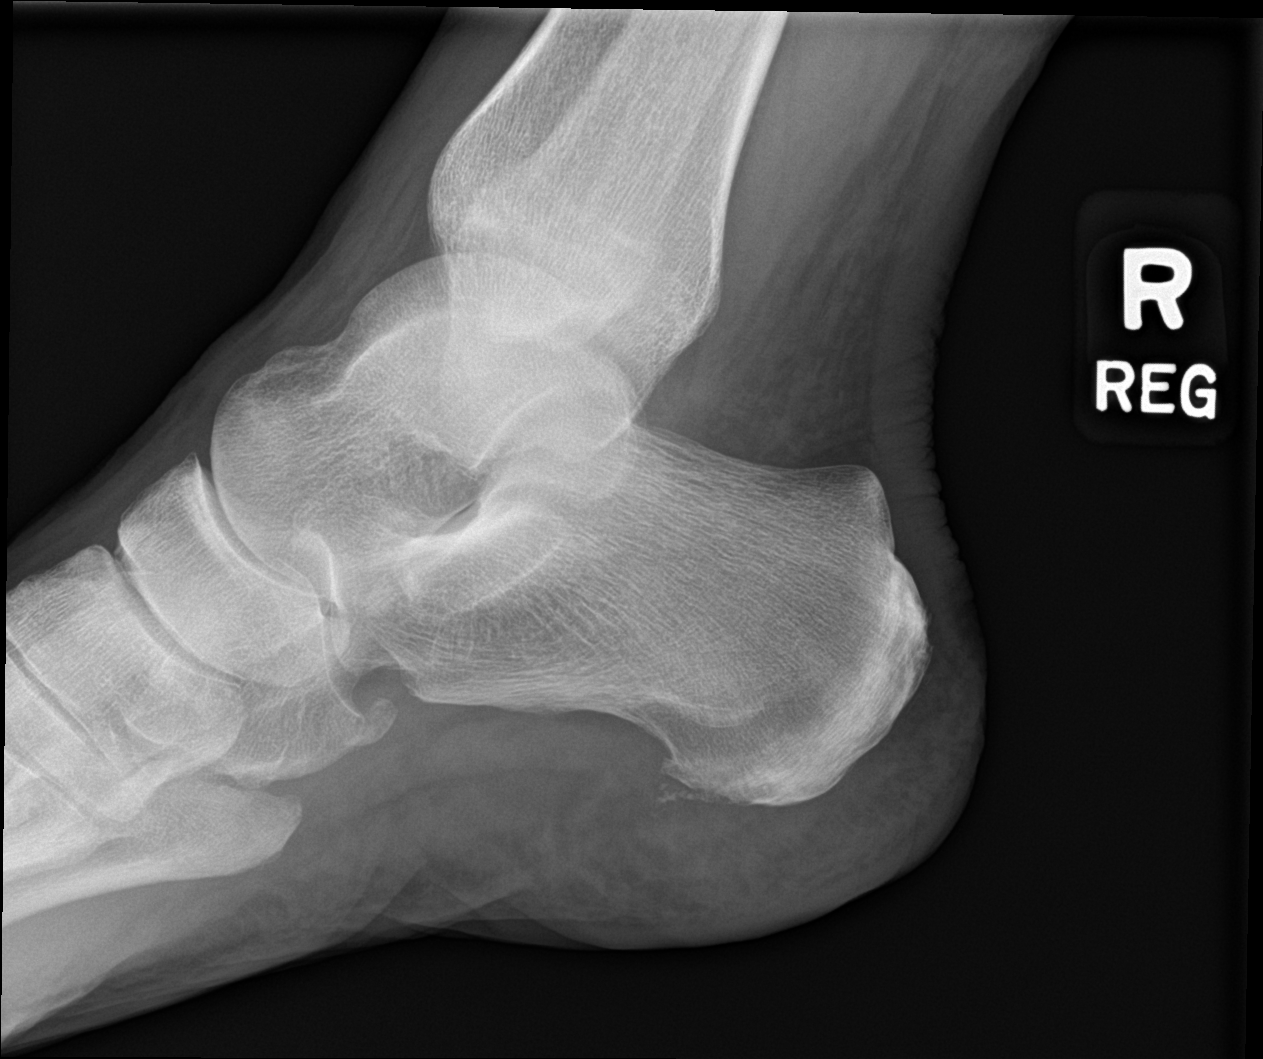

[2 of 2 positions shown; findings below may reference images not displayed]

FINDINGS: Mild calcaneal spurring is noted inferiorly. No soft tissue or acute
bony abnormality is noted.
IMPRESSION: Mild calcaneal spurring without acute abnormality.

## 2019-09-08 DIAGNOSIS — L570 Actinic keratosis: Secondary | ICD-10-CM | POA: Diagnosis not present

## 2019-09-08 DIAGNOSIS — L821 Other seborrheic keratosis: Secondary | ICD-10-CM | POA: Diagnosis not present

## 2019-10-12 ENCOUNTER — Other Ambulatory Visit: Payer: Self-pay | Admitting: *Deleted

## 2019-10-12 DIAGNOSIS — M1712 Unilateral primary osteoarthritis, left knee: Secondary | ICD-10-CM

## 2019-10-12 MED ORDER — MELOXICAM 15 MG PO TABS: 15.0000 mg | ORAL_TABLET | Freq: Every day | ORAL | 3 refills | Status: DC

## 2019-11-19 DIAGNOSIS — H524 Presbyopia: Secondary | ICD-10-CM | POA: Diagnosis not present

## 2019-11-19 DIAGNOSIS — Z0100 Encounter for examination of eyes and vision without abnormal findings: Secondary | ICD-10-CM | POA: Diagnosis not present

## 2020-01-27 ENCOUNTER — Encounter: Payer: Self-pay | Admitting: Physician Assistant

## 2020-01-30 ENCOUNTER — Ambulatory Visit: Payer: Medicare HMO | Attending: Internal Medicine

## 2020-01-30 DIAGNOSIS — Z23 Encounter for immunization: Secondary | ICD-10-CM | POA: Insufficient documentation

## 2020-01-30 NOTE — Progress Notes (Signed)
   Covid-19 Vaccination Clinic  Name:  Donna [name]    MRN: [id number] DOB: [birth year]  01/30/2020  Donna [name] was observed post Covid-19 immunization for 15 minutes without incidence. She was provided with Vaccine Information Sheet and instruction to access the V-Safe system.   Donna [name] was instructed to call 911 with any severe reactions post vaccine: [name] Difficulty breathing  . Swelling of your face and throat  . A fast heartbeat  . A bad rash all over your body  . Dizziness and weakness    Immunizations Administered    Name Date Dose VIS Date Route   Pfizer COVID-19 Vaccine 01/30/2020 12:18 PM 0.3 mL 11/12/2019 Intramuscular   Manufacturer: [place]   Lot: [id number]   [place]: [id number]

## 2020-02-29 ENCOUNTER — Encounter: Payer: Self-pay | Admitting: Physician Assistant

## 2020-02-29 ENCOUNTER — Ambulatory Visit: Payer: Medicare HMO | Attending: Internal Medicine

## 2020-02-29 DIAGNOSIS — Z23 Encounter for immunization: Secondary | ICD-10-CM

## 2020-02-29 NOTE — Progress Notes (Signed)
   Covid-19 Vaccination Clinic  Name:  Donna [name]    MRN: [id number] DOB: [birth year]  02/29/2020  [name] was observed post Covid-19 immunization for 15 minutes without incident. She was provided with Vaccine Information Sheet and instruction to access the V-Safe system.   [name] was instructed to call 911 with any severe reactions post vaccine: [name] Difficulty breathing  . Swelling of face and throat  . A fast heartbeat  . A bad rash all over body  . Dizziness and weakness   Immunizations Administered    Name Date Dose VIS Date Route   Pfizer COVID-19 Vaccine 02/29/2020 11:05 AM 0.3 mL 11/12/2019 Intramuscular   Manufacturer: [place]   Lot: [id number]   [place]: [id number]

## 2020-05-08 ENCOUNTER — Telehealth: Payer: Self-pay

## 2020-05-08 ENCOUNTER — Encounter: Payer: Self-pay | Admitting: Physician Assistant

## 2020-05-08 MED ORDER — TYPHOID VACCINE PO CPDR: 1.0000 | DELAYED_RELEASE_CAPSULE | ORAL | 0 refills | Status: DC

## 2020-05-08 NOTE — Telephone Encounter (Signed)
Pt called stating that she is going back to [place] in July and is requesting an RX for the typhoid oral vaccine pills.  She also states that she needs to have a COVID test 96 hours prior to flying.  Please advise.  Charyl Bigger, CMA

## 2020-05-08 NOTE — Telephone Encounter (Signed)
Sent oral vaccine to pharmacy.  Ok to have done here. Get her scheduled on nurse visit to be swabbed in enough time. It takes about 48 hours to get results.

## 2020-06-01 ENCOUNTER — Encounter: Payer: Self-pay | Admitting: Physician Assistant

## 2020-06-02 MED ORDER — CIPROFLOXACIN HCL 500 MG PO TABS: 500.0000 mg | ORAL_TABLET | Freq: Two times a day (BID) | ORAL | 0 refills | Status: DC

## 2020-06-16 DIAGNOSIS — Z20828 Contact with and (suspected) exposure to other viral communicable diseases: Secondary | ICD-10-CM | POA: Diagnosis not present

## 2020-06-21 DIAGNOSIS — Z20828 Contact with and (suspected) exposure to other viral communicable diseases: Secondary | ICD-10-CM | POA: Diagnosis not present

## 2020-08-18 ENCOUNTER — Encounter: Payer: Self-pay | Admitting: Physician Assistant

## 2020-08-18 ENCOUNTER — Ambulatory Visit (INDEPENDENT_AMBULATORY_CARE_PROVIDER_SITE_OTHER): Payer: Medicare HMO | Admitting: Physician Assistant

## 2020-08-18 VITALS — BP 128/69 | HR 72 | Temp 98.7°F | Ht 69.5 in | Wt 197.0 lb

## 2020-08-18 DIAGNOSIS — Z23 Encounter for immunization: Secondary | ICD-10-CM

## 2020-08-18 DIAGNOSIS — R8290 Unspecified abnormal findings in urine: Secondary | ICD-10-CM

## 2020-08-18 DIAGNOSIS — N3001 Acute cystitis with hematuria: Secondary | ICD-10-CM | POA: Diagnosis not present

## 2020-08-18 DIAGNOSIS — R350 Frequency of micturition: Secondary | ICD-10-CM | POA: Diagnosis not present

## 2020-08-18 LAB — WET PREP FOR TRICH, YEAST, CLUE
MICRO NUMBER:: 10964470
Specimen Quality: ADEQUATE

## 2020-08-18 MED ORDER — METRONIDAZOLE 500 MG PO TABS: 500.0000 mg | ORAL_TABLET | Freq: Two times a day (BID) | ORAL | 0 refills | Status: DC

## 2020-08-18 MED ORDER — NITROFURANTOIN MONOHYD MACRO 100 MG PO CAPS: 100.0000 mg | ORAL_CAPSULE | Freq: Two times a day (BID) | ORAL | 0 refills | Status: DC

## 2020-08-18 NOTE — Patient Instructions (Signed)
Urinary Tract Infection, Adult A urinary tract infection (UTI) is an infection of any part of the urinary tract. The urinary tract includes:  The kidneys.  The ureters.  The bladder.  The urethra. These organs make, store, and get rid of pee (urine) in the body. What are the causes? This is caused by germs (bacteria) in your genital area. These germs grow and cause swelling (inflammation) of your urinary tract. What increases the risk? You are more likely to develop this condition if:  You have a small, thin tube (catheter) to drain pee.  You cannot control when you pee or poop (incontinence).  You are female, and: ? You use these methods to prevent pregnancy:  A medicine that kills sperm (spermicide).  A device that blocks sperm (diaphragm). ? You have low levels of a female hormone (estrogen). ? You are pregnant.  You have genes that add to your risk.  You are sexually active.  You take antibiotic medicines.  You have trouble peeing because of: ? A prostate that is bigger than normal, if you are female. ? A blockage in the part of your body that drains pee from the bladder (urethra). ? A kidney stone. ? A nerve condition that affects your bladder (neurogenic bladder). ? Not getting enough to drink. ? Not peeing often enough.  You have other conditions, such as: ? Diabetes. ? A weak disease-fighting system (immune system). ? Sickle cell disease. ? Gout. ? Injury of the spine. What are the signs or symptoms? Symptoms of this condition include:  Needing to pee right away (urgently).  Peeing often.  Peeing small amounts often.  Pain or burning when peeing.  Blood in the pee.  Pee that smells bad or not like normal.  Trouble peeing.  Pee that is cloudy.  Fluid coming from the vagina, if you are female.  Pain in the belly or lower back. Other symptoms include:  Throwing up (vomiting).  No urge to eat.  Feeling mixed up (confused).  Being tired  and grouchy (irritable).  A fever.  Watery poop (diarrhea). How is this treated? This condition may be treated with:  Antibiotic medicine.  Other medicines.  Drinking enough water. Follow these instructions at home:  Medicines  Take over-the-counter and prescription medicines only as told by your doctor.  If you were prescribed an antibiotic medicine, take it as told by your doctor. Do not stop taking it even if you start to feel better. General instructions  Make sure you: ? Pee until your bladder is empty. ? Do not hold pee for a long time. ? Empty your bladder after sex. ? Wipe from front to back after pooping if you are a female. Use each tissue one time when you wipe.  Drink enough fluid to keep your pee pale yellow.  Keep all follow-up visits as told by your doctor. This is important. Contact a doctor if:  You do not get better after 1-2 days.  Your symptoms go away and then come back. Get help right away if:  You have very bad back pain.  You have very bad pain in your lower belly.  You have a fever.  You are sick to your stomach (nauseous).  You are throwing up. Summary  A urinary tract infection (UTI) is an infection of any part of the urinary tract.  This condition is caused by germs in your genital area.  There are many risk factors for a UTI. These include having a small, thin   tube to drain pee and not being able to control when you pee or poop.  Treatment includes antibiotic medicines for germs.  Drink enough fluid to keep your pee pale yellow. This information is not intended to replace advice given to you by your health care provider. Make sure you discuss any questions you have with your health care provider. Document Revised: 11/05/2018 Document Reviewed: 05/28/2018 Elsevier Patient Education  2020 Elsevier Inc.  

## 2020-08-18 NOTE — Progress Notes (Addendum)
Subjective:    Patient ID: Donna [name], female    DOB: December 31, 1950, 69 y.o.   MRN: [id number]  Urinary Tract Infection  This is a recurrent problem. The current episode started 1 to 4 weeks ago. The problem occurs intermittently. The problem has been gradually worsening. The pain is at a severity of 0/10. The patient is experiencing no pain. There has been no fever. There is no history of pyelonephritis. Associated symptoms include chills. Pertinent negatives include no flank pain, hematuria or urgency. She has tried antibiotics for the symptoms. The treatment provided moderate relief. There is no history of kidney stones or recurrent UTIs.    2 months ago pain chills frequency smelly urine before her trip to [place]. Then got better but smell remained. She did receive antibiotics while there but does not know what the name of the medication is. The symptoms have begun to worsen over the last several weeks. No fever, nausea, vomiting, diarrhea, vaginal itching or discharge or flank pain.    Review of Systems  Constitutional: Positive for chills. Negative for fever.  Cardiovascular: Negative for chest pain and palpitations.  Gastrointestinal: Negative for abdominal distention and abdominal pain.  Genitourinary: Negative for dysuria, flank pain, hematuria and urgency.  Musculoskeletal: Negative for back pain.       Objective:   Physical Exam Constitutional:      General: She is not in acute distress.    Appearance: Normal appearance. She is not ill-appearing.  HENT:     Head: Normocephalic.  Cardiovascular:     Rate and Rhythm: Normal rate and regular rhythm.     Pulses: Normal pulses.     Heart sounds: Normal heart sounds.  Pulmonary:     Effort: Pulmonary effort is normal.     Breath sounds: Normal breath sounds.  Abdominal:     General: Abdomen is flat. Bowel sounds are normal. There is no distension.     Palpations: Abdomen is soft.     Tenderness: There is no abdominal  tenderness. There is no right CVA tenderness, left CVA tenderness, guarding or rebound.  Skin:    General: Skin is warm and dry.  Neurological:     General: No focal deficit present.     Mental Status: She is alert and oriented to person, place, and time.  Psychiatric:        Mood and Affect: Mood normal.           Assessment & Plan:  69 yr old female with Urinary odor, and chills after recently being treated for UTI.  Marland KitchenAngelita Ingles was seen today for chills.  Diagnoses and all orders for this visit:  Abnormal urine odor -     nitrofurantoin, macrocrystal-monohydrate, (MACROBID) 100 MG capsule; Take 1 capsule (100 mg total) by mouth 2 (two) times daily. -     Urinalysis, Routine w reflex microscopic -     Urine Culture -     WET PREP FOR TRICH, YEAST, CLUE  Flu vaccine need -     Flu Vaccine QUAD High Dose(Fluad)  Acute cystitis with hematuria -     nitrofurantoin, macrocrystal-monohydrate, (MACROBID) 100 MG capsule; Take 1 capsule (100 mg total) by mouth 2 (two) times daily. -     Urinalysis, Routine w reflex microscopic -     Urine Culture -     WET PREP FOR TRICH, YEAST, CLUE     Pt has recent hx of UTI and treatment. She continues to have urinary odor and  some "chills" when she urinates. Not able to urinate in office today. Sent home to return culture. Sent macrobid for 7 days. Will culture urine if no bacteria stop abx. Wet prep ordered. Continue to stay hydrated.   Marland KitchenVernetta Honey PA-C, have reviewed and agree with the above documentation in it's entirety.

## 2020-08-18 NOTE — Addendum Note (Signed)
Addended by: Donella Stade on: 08/18/2020 01:40 PM   Modules accepted: Orders

## 2020-08-18 NOTE — Progress Notes (Signed)
You do have bacterial vaginosis. I will send metronidazole to treat. I would start the metronidazole first and wait to see what urine culture grows before starting macrobid.

## 2020-08-19 NOTE — Progress Notes (Signed)
Donna [name],   Good news. No bacteria seen in extended urine work up. You do not need to take macrobid only take the metronidazole.

## 2020-08-20 LAB — URINALYSIS, ROUTINE W REFLEX MICROSCOPIC
Bacteria, UA: NONE SEEN /HPF
Bilirubin Urine: NEGATIVE
Glucose, UA: NEGATIVE
Hgb urine dipstick: NEGATIVE
Hyaline Cast: NONE SEEN /LPF
Nitrite: NEGATIVE
Protein, ur: NEGATIVE
Specific Gravity, Urine: 1.017 (ref 1.001–1.03)
pH: 5.5 (ref 5.0–8.0)

## 2020-08-20 LAB — URINE CULTURE
MICRO NUMBER:: 10966286
SPECIMEN QUALITY:: ADEQUATE

## 2020-10-10 ENCOUNTER — Other Ambulatory Visit: Payer: Self-pay

## 2020-10-10 ENCOUNTER — Ambulatory Visit (INDEPENDENT_AMBULATORY_CARE_PROVIDER_SITE_OTHER): Payer: Medicare HMO | Admitting: Physician Assistant

## 2020-10-10 ENCOUNTER — Encounter: Payer: Self-pay | Admitting: Physician Assistant

## 2020-10-10 VITALS — BP 127/63 | HR 76 | Temp 98.0°F | Ht 69.5 in | Wt 190.0 lb

## 2020-10-10 DIAGNOSIS — K120 Recurrent oral aphthae: Secondary | ICD-10-CM

## 2020-10-10 DIAGNOSIS — M545 Low back pain, unspecified: Secondary | ICD-10-CM

## 2020-10-10 DIAGNOSIS — R109 Unspecified abdominal pain: Secondary | ICD-10-CM | POA: Diagnosis not present

## 2020-10-10 DIAGNOSIS — R82998 Other abnormal findings in urine: Secondary | ICD-10-CM

## 2020-10-10 DIAGNOSIS — Z1231 Encounter for screening mammogram for malignant neoplasm of breast: Secondary | ICD-10-CM | POA: Diagnosis not present

## 2020-10-10 DIAGNOSIS — R10A2 Flank pain, left side: Secondary | ICD-10-CM

## 2020-10-10 DIAGNOSIS — R809 Proteinuria, unspecified: Secondary | ICD-10-CM | POA: Diagnosis not present

## 2020-10-10 LAB — POCT URINALYSIS DIP (CLINITEK)
Blood, UA: NEGATIVE
Glucose, UA: NEGATIVE mg/dL
Nitrite, UA: NEGATIVE
POC PROTEIN,UA: 30 — AB
Spec Grav, UA: >=1.03 — AB (ref 1.010–1.025)
Urobilinogen, UA: 0.2 E.U./dL
pH, UA: 5.5 (ref 5.0–8.0)

## 2020-10-10 MED ORDER — KETOROLAC TROMETHAMINE 60 MG/2ML IM SOLN
60.0000 mg | Freq: Once | INTRAMUSCULAR | Status: AC
  Administered 2020-10-10: 60 mg via INTRAMUSCULAR

## 2020-10-10 NOTE — Progress Notes (Signed)
Subjective:    Patient ID: Donna [name], female    DOB: 07-13-1951, 69 y.o.   MRN: [id number]  HPI  Pt is a 69 yo female who presents to the clinic with 1 and 1/2 weeks of left low back pain intermittently. The first episode was the most severe and last 3 hours. She describes and "grabbing pain" movement made worse. No radiation of pain down legs. No bowel or bladder dysfunction or leg weakness.  Denies any urinary frequency, pain, discharge. No history of kidney stones or UTI. No fever, chills, nausea or vomiting. The left lower back pain with now "catch" when she moves. She has been traveling and driving more lately. She has taken tylenol with some benefit initally. In September had BV. No problems since.  She is getting recurrent ulcers in mouth. No diet changes. No med changes. She is very stressed.    .. Active Ambulatory Problems    Diagnosis Date Noted  . Primary osteoarthritis of left knee 06/10/2016  . Elevated LDL cholesterol level 06/10/2016  . Class 1 obesity due to excess calories without serious comorbidity with body mass index (BMI) of 32.0 to 32.9 in adult 07/26/2016  . Overweight (BMI 25.0-29.9) 01/27/2017  . Bilateral hearing loss 08/23/2018  . Post-menopausal 08/23/2018  . Tear of right supraspinatus tendon 08/23/2018  . Osteopenia 09/02/2018  . Plantar fasciitis, right 02/19/2019  . Aphthous ulcer of mouth 10/11/2020  . Proteinuria 10/11/2020  . Leukocytes in urine 10/11/2020   Resolved Ambulatory Problems    Diagnosis Date Noted  . Left knee pain 06/10/2016  . Laceration 06/10/2016  . Boil, labium 11/22/2016   Past Medical History:  Diagnosis Date  . Rosacea   . Tachycardia        Review of Systems  All other systems reviewed and are negative.      Objective:   Physical Exam Vitals reviewed.  Constitutional:      Appearance: Normal appearance.  HENT:     Head: Normocephalic.  Cardiovascular:     Rate and Rhythm: Normal rate.     Pulses:  Normal pulses.  Pulmonary:     Effort: Pulmonary effort is normal.     Breath sounds: Normal breath sounds.  Abdominal:     General: Bowel sounds are normal. There is no distension.     Palpations: Abdomen is soft.     Tenderness: There is no abdominal tenderness. There is no right CVA tenderness, left CVA tenderness, guarding or rebound.  Musculoskeletal:     Comments: Pain in left lower back with flexion forward.  No lumbar tenderness to palpation.  Negative SLR, bilaterally.  NROM at hip, bilaterally,  Strength lower ext 5/5, bilaterally.  Patellar reflexes 2+ bilaterally.  Tightness and point tenderness to palpation of the paraspinal muscles and gluteus media in left low back.   Neurological:     General: No focal deficit present.     Mental Status: She is alert and oriented to person, place, and time.  Psychiatric:        Mood and Affect: Mood normal.       .. Results for orders placed or performed in visit on 10/10/20  POCT URINALYSIS DIP (CLINITEK)  Result Value Ref Range   Color, UA yellow yellow   Clarity, UA clear clear   Glucose, UA negative negative mg/dL   Bilirubin, UA small (A) negative   Ketones, POC UA small (15) (A) negative mg/dL   Spec Grav, UA >=1.030 (A) 1.010 -  1.025   Blood, UA negative negative   pH, UA 5.5 5.0 - 8.0   POC PROTEIN,UA =30 (A) negative, trace   Urobilinogen, UA 0.2 0.2 or 1.0 E.U./dL   Nitrite, UA Negative Negative   Leukocytes, UA Small (1+) (A) Negative       Assessment & Plan:  Marland KitchenMarland KitchenYana was seen today for flank pain.  Diagnoses and all orders for this visit:  Acute left-sided low back pain without sciatica -     POCT URINALYSIS DIP (CLINITEK) -     Urine Culture -     ketorolac (TORADOL) injection 60 mg  Left flank pain -     Urine Culture -     ketorolac (TORADOL) injection 60 mg  Visit for screening mammogram -     MM 3D SCREEN BREAST BILATERAL  Aphthous ulcer of mouth -     triamcinolone (KENALOG) 0.1 %  paste; Use as directed 1 application in the mouth or throat 2 (two) times daily. As needed on ulcers.  Proteinuria, unspecified type  Leukocytes in urine   I do not think pain is assoicated with stones or urinary tract. I suspect muscle spasm of low back.  UA dipstick did show leuks/protein/ketones/billirubin. Will culture and treat accordingly. Toradol 60mg  IM given in office today.  Suggested ice hot patches, tens unit, stretches, NSAIDS as needed and massage. If not improving could get imaging. Could consider muscle relaxer.  Follow up with any worsening symptoms.   Discussed ulcer prevention: Look for triggers and avoid.  Consider b12 supplementation orally.  Topical steroid given.

## 2020-10-10 NOTE — Patient Instructions (Signed)
Consider  Heat Icy hot patches Tens unit     Low Back Sprain or Strain Rehab Ask your health care provider which exercises are safe for you. Do exercises exactly as told by your health care provider and adjust them as directed. It is normal to feel mild stretching, pulling, tightness, or discomfort as you do these exercises. Stop right away if you feel sudden pain or your pain gets worse. Do not begin these exercises until told by your health care provider. Stretching and range-of-motion exercises These exercises warm up your muscles and joints and improve the movement and flexibility of your back. These exercises also help to relieve pain, numbness, and tingling. Lumbar rotation  1. Lie on your back on a firm surface and bend your knees. 2. Straighten your arms out to your sides so each arm forms a 90-degree angle (right angle) with a side of your body. 3. Slowly move (rotate) both of your knees to one side of your body until you feel a stretch in your lower back (lumbar). Try not to let your shoulders lift off the floor. 4. Hold this position for __________ seconds. 5. Tense your abdominal muscles and slowly move your knees back to the starting position. 6. Repeat this exercise on the other side of your body. Repeat __________ times. Complete this exercise __________ times a day. Single knee to chest  1. Lie on your back on a firm surface with both legs straight. 2. Bend one of your knees. Use your hands to move your knee up toward your chest until you feel a gentle stretch in your lower back and buttock. ? Hold your leg in this position by holding on to the front of your knee. ? Keep your other leg as straight as possible. 3. Hold this position for __________ seconds. 4. Slowly return to the starting position. 5. Repeat with your other leg. Repeat __________ times. Complete this exercise __________ times a day. Prone extension on elbows  1. Lie on your abdomen on a firm surface  (prone position). 2. Prop yourself up on your elbows. 3. Use your arms to help lift your chest up until you feel a gentle stretch in your abdomen and your lower back. ? This will place some of your body weight on your elbows. If this is uncomfortable, try stacking pillows under your chest. ? Your hips should stay down, against the surface that you are lying on. Keep your hip and back muscles relaxed. 4. Hold this position for __________ seconds. 5. Slowly relax your upper body and return to the starting position. Repeat __________ times. Complete this exercise __________ times a day. Strengthening exercises These exercises build strength and endurance in your back. Endurance is the ability to use your muscles for a long time, even after they get tired. Pelvic tilt This exercise strengthens the muscles that lie deep in the abdomen. 1. Lie on your back on a firm surface. Bend your knees and keep your feet flat on the floor. 2. Tense your abdominal muscles. Tip your pelvis up toward the ceiling and flatten your lower back into the floor. ? To help with this exercise, you may place a small towel under your lower back and try to push your back into the towel. 3. Hold this position for __________ seconds. 4. Let your muscles relax completely before you repeat this exercise. Repeat __________ times. Complete this exercise __________ times a day. Alternating arm and leg raises  1. Get on your hands and knees  on a firm surface. If you are on a hard floor, you may want to use padding, such as an exercise mat, to cushion your knees. 2. Line up your arms and legs. Your hands should be directly below your shoulders, and your knees should be directly below your hips. 3. Lift your left leg behind you. At the same time, raise your right arm and straighten it in front of you. ? Do not lift your leg higher than your hip. ? Do not lift your arm higher than your shoulder. ? Keep your abdominal and back muscles  tight. ? Keep your hips facing the ground. ? Do not arch your back. ? Keep your balance carefully, and do not hold your breath. 4. Hold this position for __________ seconds. 5. Slowly return to the starting position. 6. Repeat with your right leg and your left arm. Repeat __________ times. Complete this exercise __________ times a day. Abdominal set with straight leg raise  1. Lie on your back on a firm surface. 2. Bend one of your knees and keep your other leg straight. 3. Tense your abdominal muscles and lift your straight leg up, 4-6 inches (10-15 cm) off the ground. 4. Keep your abdominal muscles tight and hold this position for __________ seconds. ? Do not hold your breath. ? Do not arch your back. Keep it flat against the ground. 5. Keep your abdominal muscles tense as you slowly lower your leg back to the starting position. 6. Repeat with your other leg. Repeat __________ times. Complete this exercise __________ times a day. Single leg lower with bent knees 1. Lie on your back on a firm surface. 2. Tense your abdominal muscles and lift your feet off the floor, one foot at a time, so your knees and hips are bent in 90-degree angles (right angles). ? Your knees should be over your hips and your lower legs should be parallel to the floor. 3. Keeping your abdominal muscles tense and your knee bent, slowly lower one of your legs so your toe touches the ground. 4. Lift your leg back up to return to the starting position. ? Do not hold your breath. ? Do not let your back arch. Keep your back flat against the ground. 5. Repeat with your other leg. Repeat __________ times. Complete this exercise __________ times a day. Posture and body mechanics Good posture and healthy body mechanics can help to relieve stress in your body's tissues and joints. Body mechanics refers to the movements and positions of your body while you do your daily activities. Posture is part of body mechanics. Good  posture means:  Your spine is in its natural S-curve position (neutral).  Your shoulders are pulled back slightly.  Your head is not tipped forward. Follow these guidelines to improve your posture and body mechanics in your everyday activities. Standing   When standing, keep your spine neutral and your feet about hip width apart. Keep a slight bend in your knees. Your ears, shoulders, and hips should line up.  When you do a task in which you stand in one place for a long time, place one foot up on a stable object that is 2-4 inches (5-10 cm) high, such as a footstool. This helps keep your spine neutral. Sitting   When sitting, keep your spine neutral and keep your feet flat on the floor. Use a footrest, if necessary, and keep your thighs parallel to the floor. Avoid rounding your shoulders, and avoid tilting your head forward.  When working at a desk or a computer, keep your desk at a height where your hands are slightly lower than your elbows. Slide your chair under your desk so you are close enough to maintain good posture.  When working at a computer, place your monitor at a height where you are looking straight ahead and you do not have to tilt your head forward or downward to look at the screen. Resting  When lying down and resting, avoid positions that are most painful for you.  If you have pain with activities such as sitting, bending, stooping, or squatting, lie in a position in which your body does not bend very much. For example, avoid curling up on your side with your arms and knees near your chest (fetal position).  If you have pain with activities such as standing for a long time or reaching with your arms, lie with your spine in a neutral position and bend your knees slightly. Try the following positions: ? Lying on your side with a pillow between your knees. ? Lying on your back with a pillow under your knees. Lifting   When lifting objects, keep your feet at least  shoulder width apart and tighten your abdominal muscles.  Bend your knees and hips and keep your spine neutral. It is important to lift using the strength of your legs, not your back. Do not lock your knees straight out.  Always ask for help to lift heavy or awkward objects. This information is not intended to replace advice given to you by your health care provider. Make sure you discuss any questions you have with your health care provider. Document Revised: 03/12/2019 Document Reviewed: 12/10/2018 Elsevier Patient Education  [place].

## 2020-10-11 DIAGNOSIS — R809 Proteinuria, unspecified: Secondary | ICD-10-CM | POA: Insufficient documentation

## 2020-10-11 DIAGNOSIS — M545 Low back pain, unspecified: Secondary | ICD-10-CM | POA: Diagnosis not present

## 2020-10-11 DIAGNOSIS — R82998 Other abnormal findings in urine: Secondary | ICD-10-CM | POA: Insufficient documentation

## 2020-10-11 DIAGNOSIS — R109 Unspecified abdominal pain: Secondary | ICD-10-CM | POA: Diagnosis not present

## 2020-10-11 DIAGNOSIS — K120 Recurrent oral aphthae: Secondary | ICD-10-CM | POA: Insufficient documentation

## 2020-10-11 MED ORDER — TRIAMCINOLONE ACETONIDE 0.1 % MT PSTE: 1.0000 "application " | PASTE | Freq: Two times a day (BID) | OROMUCOSAL | 1 refills | Status: DC

## 2020-10-12 LAB — URINE CULTURE
MICRO NUMBER:: 11186615
SPECIMEN QUALITY:: ADEQUATE

## 2020-10-13 NOTE — Progress Notes (Signed)
Donna [name],   No bacteria in urine culture. How is your back pain?

## 2020-12-06 ENCOUNTER — Other Ambulatory Visit: Payer: Self-pay

## 2020-12-06 ENCOUNTER — Ambulatory Visit (INDEPENDENT_AMBULATORY_CARE_PROVIDER_SITE_OTHER): Payer: Medicare HMO

## 2020-12-06 DIAGNOSIS — Z1231 Encounter for screening mammogram for malignant neoplasm of breast: Secondary | ICD-10-CM

## 2020-12-07 NOTE — Progress Notes (Signed)
Normal mammogram. Follow up in one year.

## 2020-12-26 ENCOUNTER — Ambulatory Visit (INDEPENDENT_AMBULATORY_CARE_PROVIDER_SITE_OTHER): Payer: Medicare HMO | Admitting: Sports Medicine

## 2020-12-26 ENCOUNTER — Other Ambulatory Visit: Payer: Self-pay

## 2020-12-26 DIAGNOSIS — E7800 Pure hypercholesterolemia, unspecified: Secondary | ICD-10-CM

## 2020-12-26 DIAGNOSIS — M1712 Unilateral primary osteoarthritis, left knee: Secondary | ICD-10-CM | POA: Diagnosis not present

## 2020-12-26 MED ORDER — MELOXICAM 15 MG PO TABS: 7.5000 mg | ORAL_TABLET | Freq: Every day | ORAL | 3 refills | Status: DC

## 2020-12-26 NOTE — Assessment & Plan Note (Signed)
Seems to do well, meloxicam seems highly effective, she is going to try coming off of it for now, she has not gotten routine labs in about 2 or 3 years so we will order these now, abnormalities can be followed up with her PCP. Refilling meloxicam. She will stop it for a week and if pain returns she can start again with a half tab. Return to see me as needed.

## 2020-12-26 NOTE — Progress Notes (Signed)
    Procedures performed today:    None.  Independent interpretation of notes and tests performed by another provider:   None.  Brief History, Exam, Impression, and Recommendations:    Primary osteoarthritis of left knee Seems to do well, meloxicam seems highly effective, she is going to try coming off of it for now, she has not gotten routine labs in about 2 or 3 years so we will order these now, abnormalities can be followed up with her PCP. Refilling meloxicam. She will stop it for a week and if pain returns she can start again with a half tab. Return to see me as needed.    ___________________________________________ Gwen Her. Dianah Field, M.D., ABFM., CAQSM. Primary Care and [place] Instructor of [place] [facility]

## 2020-12-27 DIAGNOSIS — E7800 Pure hypercholesterolemia, unspecified: Secondary | ICD-10-CM | POA: Diagnosis not present

## 2020-12-27 LAB — COMPREHENSIVE METABOLIC PANEL
AG Ratio: 1.6 (calc) (ref 1.0–2.5)
ALT: 11 U/L (ref 6–29)
AST: 16 U/L (ref 10–35)
Albumin: 3.9 g/dL (ref 3.6–5.1)
Alkaline phosphatase (APISO): 95 U/L (ref 37–153)
BUN/Creatinine Ratio: 21 (calc) (ref 6–22)
BUN: 22 mg/dL (ref 7–25)
CO2: 27 mmol/L (ref 20–32)
Calcium: 9.1 mg/dL (ref 8.6–10.4)
Chloride: 106 mmol/L (ref 98–110)
Creat: 1.04 mg/dL — ABNORMAL HIGH (ref 0.60–0.93)
Globulin: 2.4 g/dL (calc) (ref 1.9–3.7)
Glucose, Bld: 90 mg/dL (ref 65–99)
Potassium: 4.3 mmol/L (ref 3.5–5.3)
Sodium: 141 mmol/L (ref 135–146)
Total Bilirubin: 0.7 mg/dL (ref 0.2–1.2)
Total Protein: 6.3 g/dL (ref 6.1–8.1)

## 2020-12-27 LAB — CBC
HCT: 41.3 % (ref 35.0–45.0)
Hemoglobin: 13.9 g/dL (ref 11.7–15.5)
MCH: 31.2 pg (ref 27.0–33.0)
MCHC: 33.7 g/dL (ref 32.0–36.0)
MCV: 92.8 fL (ref 80.0–100.0)
MPV: 10.9 fL (ref 7.5–12.5)
Platelets: 257 10*3/uL (ref 140–400)
RBC: 4.45 10*6/uL (ref 3.80–5.10)
RDW: 12.3 % (ref 11.0–15.0)
WBC: 7.2 10*3/uL (ref 3.8–10.8)

## 2020-12-27 LAB — LIPID PANEL
Cholesterol: 178 mg/dL (ref <200)
HDL: 63 mg/dL (ref >=50)
LDL Cholesterol (Calc): 95 mg/dL (calc)
Non-HDL Cholesterol (Calc): 115 mg/dL (calc) (ref <130)
Total CHOL/HDL Ratio: 2.8 (calc) (ref <5.0)
Triglycerides: 100 mg/dL (ref <150)

## 2020-12-27 LAB — TSH: TSH: 1.19 mIU/L (ref 0.40–4.50)

## 2021-01-01 DIAGNOSIS — L820 Inflamed seborrheic keratosis: Secondary | ICD-10-CM | POA: Diagnosis not present

## 2021-01-01 DIAGNOSIS — D1801 Hemangioma of skin and subcutaneous tissue: Secondary | ICD-10-CM | POA: Diagnosis not present

## 2021-01-01 DIAGNOSIS — L821 Other seborrheic keratosis: Secondary | ICD-10-CM | POA: Diagnosis not present

## 2021-01-01 DIAGNOSIS — L720 Epidermal cyst: Secondary | ICD-10-CM | POA: Diagnosis not present

## 2021-02-19 ENCOUNTER — Other Ambulatory Visit: Payer: Self-pay

## 2021-02-19 ENCOUNTER — Ambulatory Visit (INDEPENDENT_AMBULATORY_CARE_PROVIDER_SITE_OTHER): Payer: Medicare HMO | Admitting: Physician Assistant

## 2021-02-19 VITALS — BP 117/59 | HR 70 | Temp 98.0°F | Resp 16 | Ht 69.5 in | Wt 179.0 lb

## 2021-02-19 DIAGNOSIS — Z0000 Encounter for general adult medical examination without abnormal findings: Secondary | ICD-10-CM | POA: Diagnosis not present

## 2021-02-19 DIAGNOSIS — Z1211 Encounter for screening for malignant neoplasm of colon: Secondary | ICD-10-CM

## 2021-02-19 NOTE — Progress Notes (Signed)
MEDICARE ANNUAL WELLNESS VISIT  02/19/2021  Subjective:  Donna [name] is a 70 y.o. female patient of Alden Hipp, Royetta Car, PA-C who had a [employer] Visit today. Donna [name] is Retired and lives alone. she has 2 children. she reports that she is socially active and does interact with friends/family regularly. she is moderately physically active and enjoys traveling, reading and volunteering.  Patient Care Team: Lavada Mesi as PCP - General (Family Medicine)  Advanced Directives 02/19/2021 05/28/2019 04/15/2019 04/09/2019 07/03/2016 06/10/2016  Does Patient Have a Medical Advance Directive? Yes Yes Yes Yes No No  Type of Paramedic of [place];Living will [place];Living will - Living will - -  Does patient want to make changes to medical advance directive? No - Patient declined - - No - Patient declined - -  Copy of [place] in Chart? No - copy requested Yes - validated most recent copy scanned in chart (See row information) No - copy requested No - copy requested - -  Would patient like information on creating a medical advance directive? - - - - No - patient declined information Yes - Educational materials given    Hospital Utilization Over the Past 12 Months: # of hospitalizations or ER visits: 0 # of surgeries: 0  Review of Systems    Patient reports that her overall health is better when compared to last year.  Review of Systems: History obtained from chart review and the patient  All other systems negative.  Pain Assessment Pain : No/denies pain     Current Medications & Allergies (verified) Allergies as of 02/19/2021      Reactions   Shellfish Allergy Nausea And Vomiting      Medication List       Accurate as of February 19, 2021  9:02 AM. If you have any questions, ask your nurse or doctor.        meloxicam 15 MG tablet Commonly known as: Mobic Take 0.5-1 tablets (7.5-15 mg total) by  mouth daily. What changed: additional instructions   SENIOR MULTIVITAMIN PLUS PO Take by mouth.   triamcinolone 0.1 % paste Commonly known as: KENALOG Use as directed 1 application in the mouth or throat 2 (two) times daily. As needed on ulcers.       History (reviewed): Past Medical History:  Diagnosis Date  . Rosacea   . Tachycardia    Past Surgical History:  Procedure Laterality Date  . dilation curretage    . endometrial tumor    . NOSE SURGERY  1960  . PARTIAL HYSTERECTOMY  1979   removed one tube and one ovary   . SHOULDER ARTHROSCOPY WITH ROTATOR CUFF REPAIR AND SUBACROMIAL DECOMPRESSION Right 04/15/2019   Procedure: RIGHT SHOULDER ARTHROSCOPY WITH ROTATOR CUFF REPAIR AND SUBACROMIAL DECOMPRESSION, DISTAL CLAVICULECTOMY, BICEPS TENODESIS;  Surgeon: Hiram Gash, MD;  Location: [facility];  Service: Orthopedics;  Laterality: Right;  . TONSILLECTOMY  1957   Family History  Problem Relation Age of Onset  . Diabetes Paternal Uncle   . Breast cancer Sister   . Stroke Paternal Grandmother   . Liver disease Father    Social History   Socioeconomic History  . Marital status: Widowed    Spouse name: Not on file  . Number of children: 2  . Years of education: Bachelor's degree  . Highest education level: Bachelor's degree (e.g., BA, AB, BS)  Occupational History  . Occupation: Retired.  Tobacco Use  .  Smoking status: Never Smoker  . Smokeless tobacco: Never Used  Substance and Sexual Activity  . Alcohol use: Yes    Alcohol/week: 0.0 standard drinks    Comment: occasionally  . Drug use: No  . Sexual activity: Not Currently    Birth control/protection: Post-menopausal  Other Topics Concern  . Not on file  Social History Narrative   Lives alone. Has two children. She likes to travel, read and volunteer in her free time.   Social Determinants of Health   Financial Resource Strain: Low Risk   . Difficulty of Paying Living Expenses: Not hard at  all  Food Insecurity: No Food Insecurity  . Worried About Charity fundraiser in the Last Year: Never true  . Ran Out of Food in the Last Year: Never true  Transportation Needs: No Transportation Needs  . Lack of Transportation (Medical): No  . Lack of Transportation (Non-Medical): No  Physical Activity: Insufficiently Active  . Days of Exercise per Week: 3 days  . Minutes of Exercise per Session: 20 min  Stress: No Stress Concern Present  . Feeling of Stress : Not at all  Social Connections: Moderately Integrated  . Frequency of Communication with Friends and Family: More than three times a week  . Frequency of Social Gatherings with Friends and Family: Three times a week  . Attends Religious Services: More than 4 times per year  . Active Member of Clubs or Organizations: Yes  . Attends Archivist Meetings: More than 4 times per year  . Marital Status: Widowed    Activities of Daily Living In your present state of health, do you have any difficulty performing the following activities: 02/19/2021  Hearing? Y  Comment sometimes.  Vision? N  Difficulty concentrating or making decisions? N  Walking or climbing stairs? N  Dressing or bathing? N  Doing errands, shopping? N  Preparing Food and eating ? N  Using the Toilet? N  In the past six months, have you accidently leaked urine? Y  Comment sometimes.  Do you have problems with loss of bowel control? N  Managing your Medications? N  Managing your Finances? N  Housekeeping or managing your Housekeeping? N  Some recent data might be hidden    Patient Education/Literacy How often do you need to have someone help you when you read instructions, pamphlets, or other written materials from your doctor or pharmacy?: 1 - Never What is the last grade level you completed in school?: Bachelor's degree and a teaching certificate  Exercise Current Exercise Habits: Home exercise routine, Type of exercise: walking, Time (Minutes):  20, Frequency (Times/Week): 3, Weekly Exercise (Minutes/Week): 60, Intensity: Moderate, Exercise limited by: None identified  Diet Patient reports consuming 3 meals a day and 1 snack(s) a day Patient reports that her primary diet is: Regular Patient reports that she does have regular access to food.   Depression Screen PHQ 2/9 Scores 02/19/2021 08/11/2018 06/10/2016  PHQ - 2 Score 0 0 0  PHQ- 9 Score - 4 -     Fall Risk Fall Risk  02/19/2021 11/06/2017 06/10/2016  Falls in the past year? 0 No Yes  Comment - Emmi Telephone Survey: data to providers prior to load -  Number falls in past yr: 0 - 1  Injury with Fall? 0 - Yes  Risk for fall due to : No Fall Risks - -  Follow up Falls evaluation completed - -     Objective:   BP (!) 117/59 (  BP Location: Right Arm, Patient Position: Sitting, Cuff Size: Normal)   Pulse 70   Temp 98 F (36.7 C) (Oral)   Resp 16   Ht 5' 9.5" (1.765 m)   Wt 179 lb 0.6 oz (81.2 kg)   SpO2 99%   BMI 26.06 kg/m   Last Weight  Most recent update: 02/19/2021  8:07 AM   Weight  81.2 kg (179 lb 0.6 oz)            Body mass index is 26.06 kg/m.  Hearing/Vision  . Donna [name] did not have difficulty with hearing/understanding during the face-to-face interview . Donna [name] did not have difficulty with her vision during the face-to-face interview . Reports that she has had a formal eye exam by an eye care professional within the past year . Reports that she has not had a formal hearing evaluation within the past year  Cognitive Function: 6CIT Screen 02/19/2021  What Year? 0 points  What month? 0 points  What time? 0 points  Count back from 20 0 points  Months in reverse 0 points  Repeat phrase 0 points  Total Score 0    Normal Cognitive Function Screening: Yes (Normal:0-7, Significant for Dysfunction: >8)  Immunization & Health Maintenance Record Immunization History  Administered Date(s) Administered  . Fluad Quad(high Dose 65+) 08/18/2020  .  Influenza, High Dose Seasonal PF 09/25/2017, 08/28/2018  . Influenza-Unspecified 08/15/2016  . PFIZER(Purple Top)SARS-COV-2 Vaccination 01/30/2020, 02/29/2020, 01/15/2021  . Pneumococcal Conjugate-13 09/25/2017  . Tdap 11/09/2018  . Typhoid Live 11/12/2018    Health Maintenance  Topic Date Due  . COLONOSCOPY (Pts 45-63yrs Insurance coverage will need to be confirmed)  10/10/2021 (Originally 03/28/2020)  . Hepatitis C Screening  10/10/2021 (Originally Apr 28, 1951)  . PNA vac Low Risk Adult (2 of 2 - PPSV23) 10/10/2021 (Originally 09/25/2018)  . MAMMOGRAM  12/06/2022  . TETANUS/TDAP  11/09/2028  . INFLUENZA VACCINE  Completed  . DEXA SCAN  Completed  . COVID-19 Vaccine  Completed  . HPV VACCINES  Aged Out       Assessment  This is a routine wellness examination for Donna [name].  Health Maintenance: Due or Overdue There are no preventive care reminders to display for this patient.  Donna [name] does not need a referral for Community Assistance: Care Management:   no Social Work:    no Prescription Assistance:  no Nutrition/Diabetes Education:  no   Plan:  Personalized Goals Goals Addressed              This Visit's Progress   .  Patient Stated (pt-stated)        02/19/2021 AWV Goal: Exercise for General Health   Patient will verbalize understanding of the benefits of increased physical activity:  Exercising regularly is important. It will improve your overall fitness, flexibility, and endurance.  Regular exercise also will improve your overall health. It can help you control your weight, reduce stress, and improve your bone density.  Over the next year, patient will increase physical activity as tolerated with a goal of at least 150 minutes of moderate physical activity per week.   You can tell that you are exercising at a moderate intensity if your heart starts beating faster and you start breathing faster but can still hold a  conversation.  Moderate-intensity exercise ideas include:  Walking 1 mile (1.6 km) in about 15 minutes  Biking  Hiking  Golfing  Dancing  Water aerobics  Patient will verbalize understanding of everyday activities that increase physical  activity by providing examples like the following: ? Yard work, such as: ? Pushing a Conservation officer, nature ? Raking and bagging leaves ? Washing your car ? Pushing a stroller ? Shoveling snow ? Gardening ? Washing windows or floors  Patient will be able to explain general safety guidelines for exercising:   Before you start a new exercise program, talk with your health care provider.  Do not exercise so much that you hurt yourself, feel dizzy, or get very short of breath.  Wear comfortable clothes and wear shoes with good support.  Drink plenty of water while you exercise to prevent dehydration or heat stroke.  Work out until your breathing and your heartbeat get faster.       Personalized Health Maintenance & Screening Recommendations  Pneumococcal vaccine  Colorectal cancer screening Shingrix  Lung Cancer Screening Recommended: no (Low Dose CT Chest recommended if Age 57-80 years, 30 pack-year currently smoking OR have quit w/in past 15 years) Hepatitis C Screening recommended: yes HIV Screening recommended: yes  Advanced Directives: Written information was not given per the patient's request.  Referrals & Orders Orders Placed This Encounter  Procedures  . Ambulatory referral to Gastroenterology (for Colonoscopy)    Follow-up Plan . Follow-up with Donella Stade, PA-C as planned . Schedule your pneumonia vaccine for nurse visit. . Schedule your shingrix vaccine at your pharmacy.  . Colonoscopy referral has been sent.  . Medicare wellness in one year.   I have personally reviewed and noted the following in the patient's chart:   . Medical and social history . Use of alcohol, tobacco or illicit drugs  . Current medications  and supplements . Functional ability and status . Nutritional status . Physical activity . Advanced directives . List of other physicians . Hospitalizations, surgeries, and ER visits in previous 12 months . Vitals . Screenings to include cognitive, depression, and falls . Referrals and appointments  In addition, I have reviewed and discussed with patient certain preventive protocols, quality metrics, and best practice recommendations. A written personalized care plan for preventive services as well as general preventive health recommendations were provided to patient.     Tinnie Gens, RN  02/19/2021

## 2021-02-19 NOTE — Patient Instructions (Addendum)
[facility] Maintenance Summary and Written Plan of Care  Ms. Donna [name] ,  Thank you for allowing me to perform your Medicare Annual Wellness Visit and for your ongoing commitment to your health.   Health Maintenance & Immunization History Health Maintenance  Topic Date Due  . COLONOSCOPY (Pts 45-58yrs Insurance coverage will need to be confirmed)  10/10/2021 (Originally 03/28/2020)  . Hepatitis C Screening  10/10/2021 (Originally December 05, 1950)  . PNA vac Low Risk Adult (2 of 2 - PPSV23) 10/10/2021 (Originally 09/25/2018)  . MAMMOGRAM  12/06/2022  . TETANUS/TDAP  11/09/2028  . INFLUENZA VACCINE  Completed  . DEXA SCAN  Completed  . COVID-19 Vaccine  Completed  . HPV VACCINES  Aged Out   Immunization History  Administered Date(s) Administered  . Fluad Quad(high Dose 65+) 08/18/2020  . Influenza, High Dose Seasonal PF 09/25/2017, 08/28/2018  . Influenza-Unspecified 08/15/2016  . PFIZER(Purple Top)SARS-COV-2 Vaccination 01/30/2020, 02/29/2020, 01/15/2021  . Pneumococcal Conjugate-13 09/25/2017  . Tdap 11/09/2018  . Typhoid Live 11/12/2018    These are the patient goals that we discussed: Goals Addressed              This Visit's Progress   .  Patient Stated (pt-stated)        02/19/2021 AWV Goal: Exercise for General Health   Patient will verbalize understanding of the benefits of increased physical activity:  Exercising regularly is important. It will improve your overall fitness, flexibility, and endurance.  Regular exercise also will improve your overall health. It can help you control your weight, reduce stress, and improve your bone density.  Over the next year, patient will increase physical activity as tolerated with a goal of at least 150 minutes of moderate physical activity per week.   You can tell that you are exercising at a moderate intensity if your heart starts beating faster and you start breathing faster but can still hold a  conversation.  Moderate-intensity exercise ideas include:  Walking 1 mile (1.6 km) in about 15 minutes  Biking  Hiking  Golfing  Dancing  Water aerobics  Patient will verbalize understanding of everyday activities that increase physical activity by providing examples like the following: ? Yard work, such as: ? Pushing a Conservation officer, nature ? Raking and bagging leaves ? Washing your car ? Pushing a stroller ? Shoveling snow ? Gardening ? Washing windows or floors  Patient will be able to explain general safety guidelines for exercising:   Before you start a new exercise program, talk with your health care provider.  Do not exercise so much that you hurt yourself, feel dizzy, or get very short of breath.  Wear comfortable clothes and wear shoes with good support.  Drink plenty of water while you exercise to prevent dehydration or heat stroke.  Work out until your breathing and your heartbeat get faster.         This is a list of Health Maintenance Items that are overdue or due now: There are no preventive care reminders to display for this patient.   Orders/Referrals Placed Today: Orders Placed This Encounter  Procedures  . Ambulatory referral to Gastroenterology (for Colonoscopy)    Referral Priority:   Routine    Referral Type:   Consultation    Referral Reason:   Specialty Services Required    Number of Visits Requested:   1   (Contact our referral department at [phone] if you have not spoken with someone about your referral appointment within the next 5  days)    Follow-up Plan . Follow-up with Donella Stade, PA-C as planned . Schedule your pneumonia vaccine for nurse visit. . Schedule your shingrix vaccine at your pharmacy.  . Colonoscopy referral has been sent.  Medicare wellness in one year.  Health Maintenance, Female Adopting a healthy lifestyle and getting preventive care are important in promoting health and wellness. Ask your health care  provider about:  The right schedule for you to have regular tests and exams.  Things you can do on your own to prevent diseases and keep yourself healthy. What should I know about diet, weight, and exercise? Eat a healthy diet  Eat a diet that includes plenty of vegetables, fruits, low-fat dairy products, and lean protein.  Do not eat a lot of foods that are high in solid fats, added sugars, or sodium.   Maintain a healthy weight Body mass index (BMI) is used to identify weight problems. It estimates body fat based on height and weight. Your health care provider can help determine your BMI and help you achieve or maintain a healthy weight. Get regular exercise Get regular exercise. This is one of the most important things you can do for your health. Most adults should:  Exercise for at least 150 minutes each week. The exercise should increase your heart rate and make you sweat (moderate-intensity exercise).  Do strengthening exercises at least twice a week. This is in addition to the moderate-intensity exercise.  Spend less time sitting. Even light physical activity can be beneficial. Watch cholesterol and blood lipids Have your blood tested for lipids and cholesterol at 71 years of age, then have this test every 5 years. Have your cholesterol levels checked more often if:  Your lipid or cholesterol levels are high.  You are older than 71 years of age.  You are at high risk for heart disease. What should I know about cancer screening? Depending on your health history and family history, you may need to have cancer screening at various ages. This may include screening for:  Breast cancer.  Cervical cancer.  Colorectal cancer.  Skin cancer.  Lung cancer. What should I know about heart disease, diabetes, and high blood pressure? Blood pressure and heart disease  High blood pressure causes heart disease and increases the risk of stroke. This is more likely to develop in  people who have high blood pressure readings, are of African descent, or are overweight.  Have your blood pressure checked: ? Every 3-5 years if you are 33-93 years of age. ? Every year if you are 60 years old or older. Diabetes Have regular diabetes screenings. This checks your fasting blood sugar level. Have the screening done:  Once every three years after age 17 if you are at a normal weight and have a low risk for diabetes.  More often and at a younger age if you are overweight or have a high risk for diabetes. What should I know about preventing infection? Hepatitis B If you have a higher risk for hepatitis B, you should be screened for this virus. Talk with your health care provider to find out if you are at risk for hepatitis B infection. Hepatitis C Testing is recommended for:  Everyone born from [birth year] through 1966.  Anyone with known risk factors for hepatitis C. Sexually transmitted infections (STIs)  Get screened for STIs, including gonorrhea and chlamydia, if: ? You are sexually active and are younger than 71 years of age. ? You are older than 71  years of age and your health care provider tells you that you are at risk for this type of infection. ? Your sexual activity has changed since you were last screened, and you are at increased risk for chlamydia or gonorrhea. Ask your health care provider if you are at risk.  Ask your health care provider about whether you are at high risk for HIV. Your health care provider may recommend a prescription medicine to help prevent HIV infection. If you choose to take medicine to prevent HIV, you should first get tested for HIV. You should then be tested every 3 months for as long as you are taking the medicine. Pregnancy  If you are about to stop having your period (premenopausal) and you may become pregnant, seek counseling before you get pregnant.  Take 400 to 800 micrograms (mcg) of folic acid every day if you become  pregnant.  Ask for birth control (contraception) if you want to prevent pregnancy. Osteoporosis and menopause Osteoporosis is a disease in which the bones lose minerals and strength with aging. This can result in bone fractures. If you are 72 years old or older, or if you are at risk for osteoporosis and fractures, ask your health care provider if you should:  Be screened for bone loss.  Take a calcium or vitamin D supplement to lower your risk of fractures.  Be given hormone replacement therapy (HRT) to treat symptoms of menopause. Follow these instructions at home: Lifestyle  Do not use any products that contain nicotine or tobacco, such as cigarettes, e-cigarettes, and chewing tobacco. If you need help quitting, ask your health care provider.  Do not use street drugs.  Do not share needles.  Ask your health care provider for help if you need support or information about quitting drugs. Alcohol use  Do not drink alcohol if: ? Your health care provider tells you not to drink. ? You are pregnant, may be pregnant, or are planning to become pregnant.  If you drink alcohol: ? Limit how much you use to 0-1 drink a day. ? Limit intake if you are breastfeeding.  Be aware of how much alcohol is in your drink. In the [place] one drink equals one 12 oz bottle of beer (355 mL), one 5 oz glass of wine (148 mL), or one 1 oz glass of hard liquor (44 mL). General instructions  Schedule regular health, dental, and eye exams.  Stay current with your vaccines.  Tell your health care provider if: ? You often feel depressed. ? You have ever been abused or do not feel safe at home. Summary  Adopting a healthy lifestyle and getting preventive care are important in promoting health and wellness.  Follow your health care provider's instructions about healthy diet, exercising, and getting tested or screened for diseases.  Follow your health care provider's instructions on monitoring your  cholesterol and blood pressure. This information is not intended to replace advice given to you by your health care provider. Make sure you discuss any questions you have with your health care provider. Document Revised: 11/11/2018 Document Reviewed: 11/11/2018 Elsevier Patient Education  2021 [employer].

## 2021-02-22 DIAGNOSIS — Z20822 Contact with and (suspected) exposure to covid-19: Secondary | ICD-10-CM | POA: Diagnosis not present

## 2021-05-16 ENCOUNTER — Telehealth: Payer: Self-pay | Admitting: *Deleted

## 2021-05-16 NOTE — Telephone Encounter (Signed)
Miralax 1 packet/capful in 4oz of juice as needed up to twice a day can help episodes of constipation. Ok to schedule to talk through this.

## 2021-05-16 NOTE — Telephone Encounter (Signed)
Pt will try the Miralax.  If that doesn't help then she will call back for an appointment.

## 2021-05-16 NOTE — Telephone Encounter (Signed)
Pt left vm this morning stating that yesterday she had one "severe bout of constipation/bowel obstruction" that eventually passed but was painful.  She said that she thinks she needs to be seen and have some labs drawn.  Would you like me to get her scheduled or would you like to give her some suggestions to try first? Please advise.

## 2021-05-30 ENCOUNTER — Encounter: Payer: Self-pay | Admitting: Internal Medicine

## 2021-06-09 IMAGING — MG DIGITAL SCREENING BILAT W/ TOMO W/ CAD
6 of 10 series · 6 of 30 positions shown · non-contrast
Comparison: Previous exam(s).

CLINICAL DATA: Screening.

EXAM:
DIGITAL SCREENING BILATERAL MAMMOGRAM WITH TOMO AND CAD

[L CC synth-2D (1 of 2)]
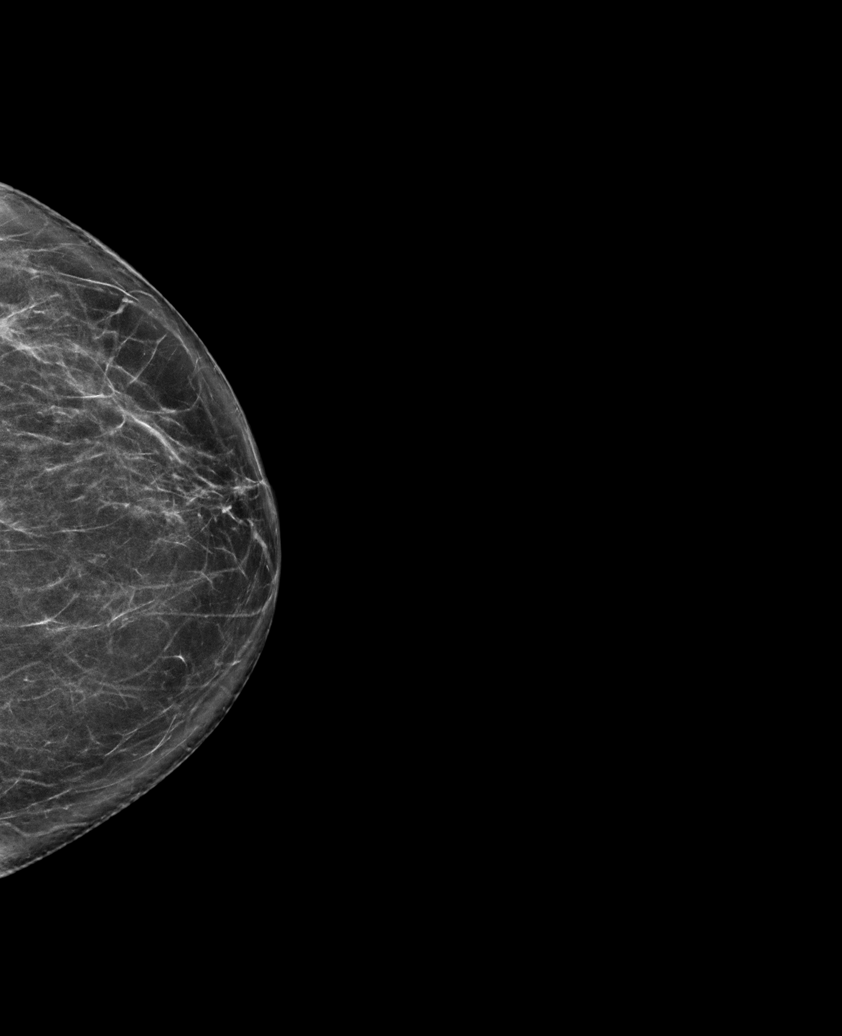

[L CC synth-2D (2 of 2)]
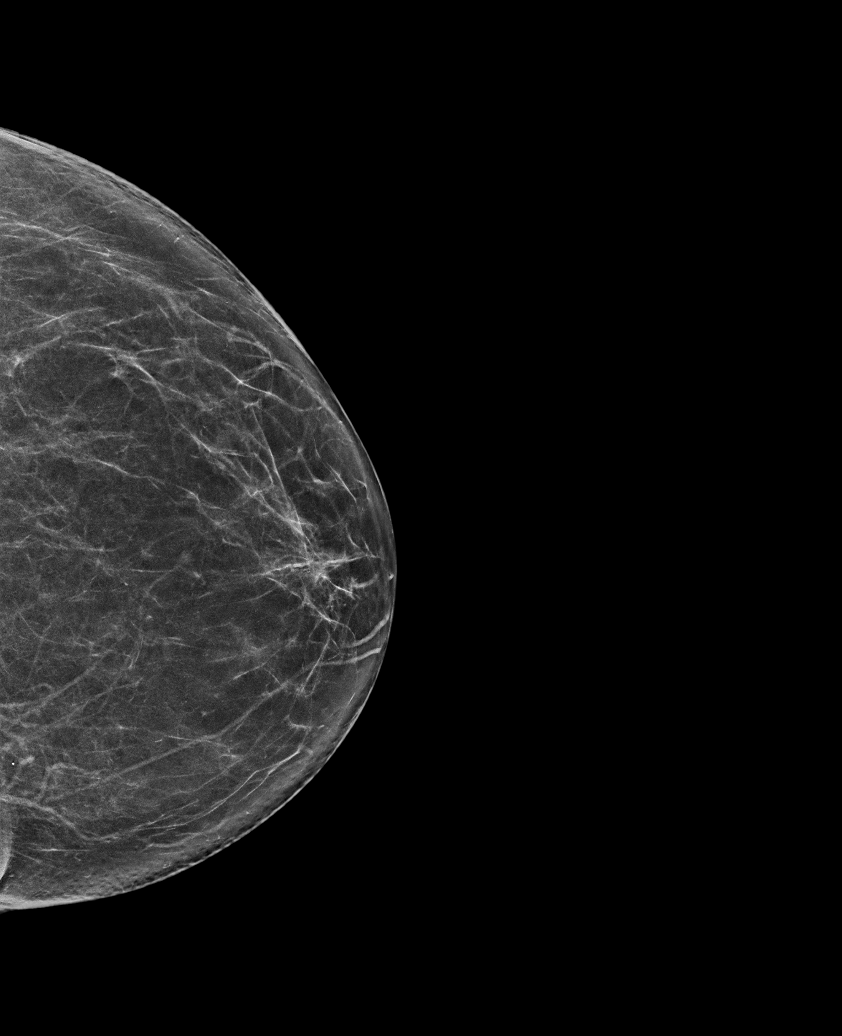

[L MLO synth-2D]
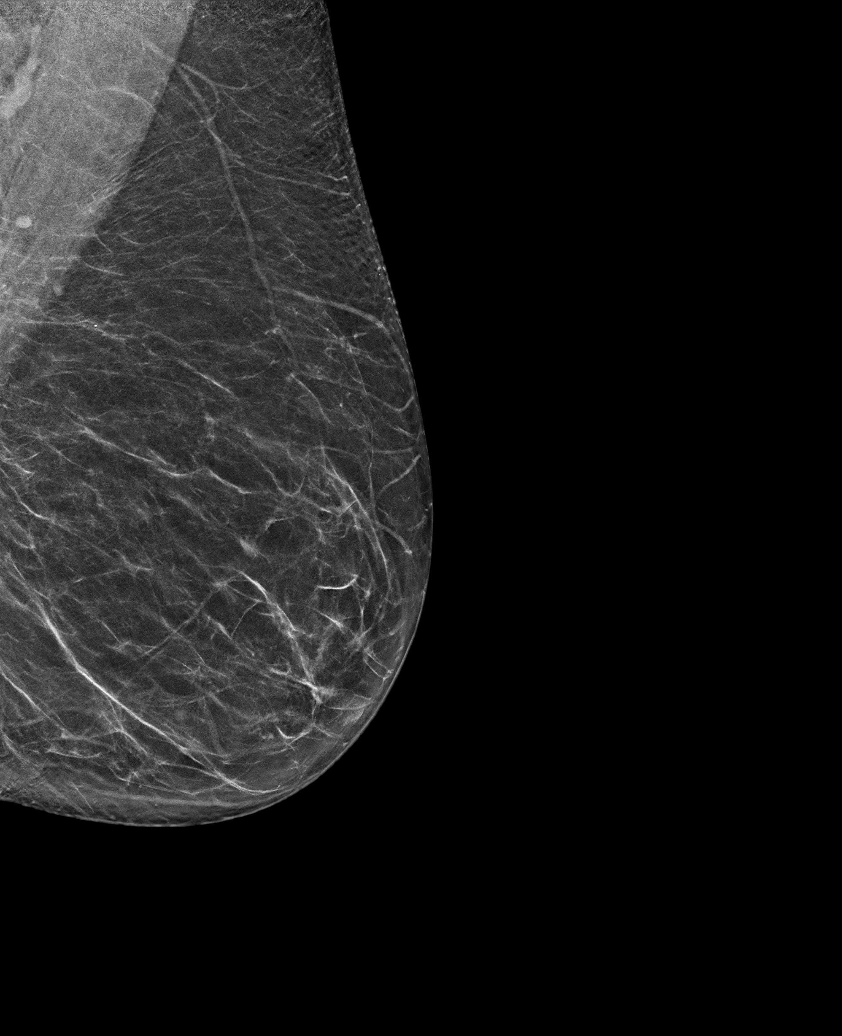

[R MLO synth-2D]
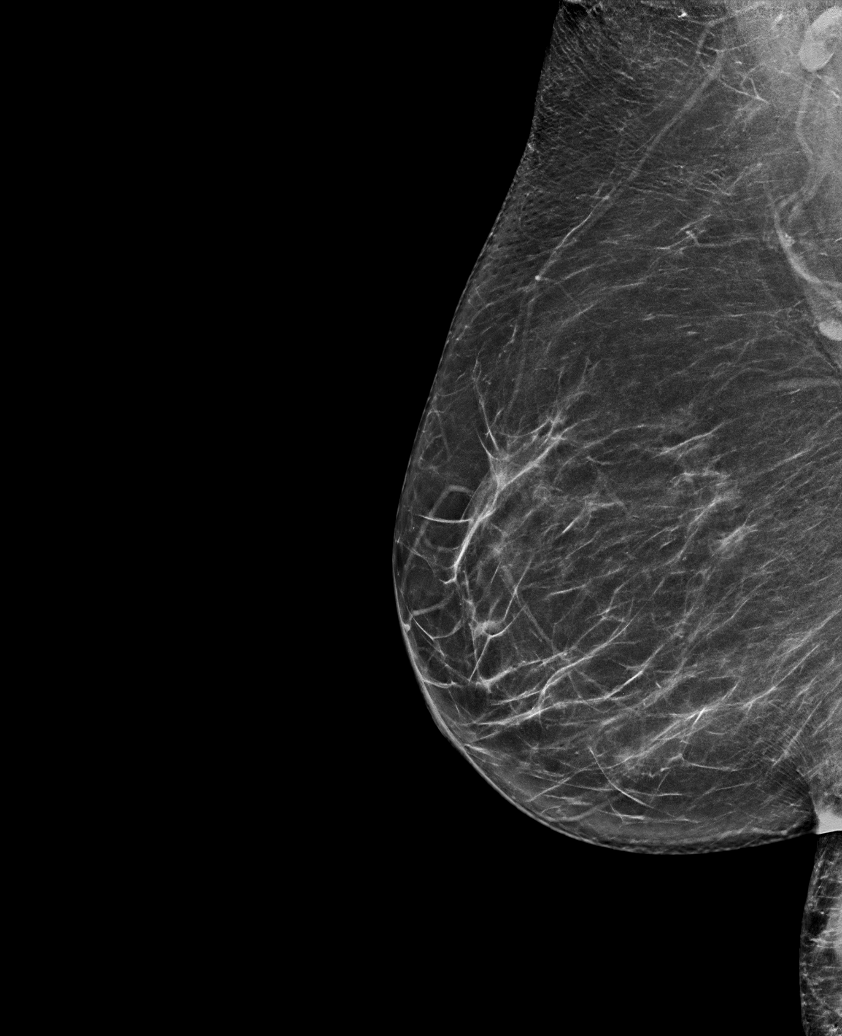

[R CC synth-2D]
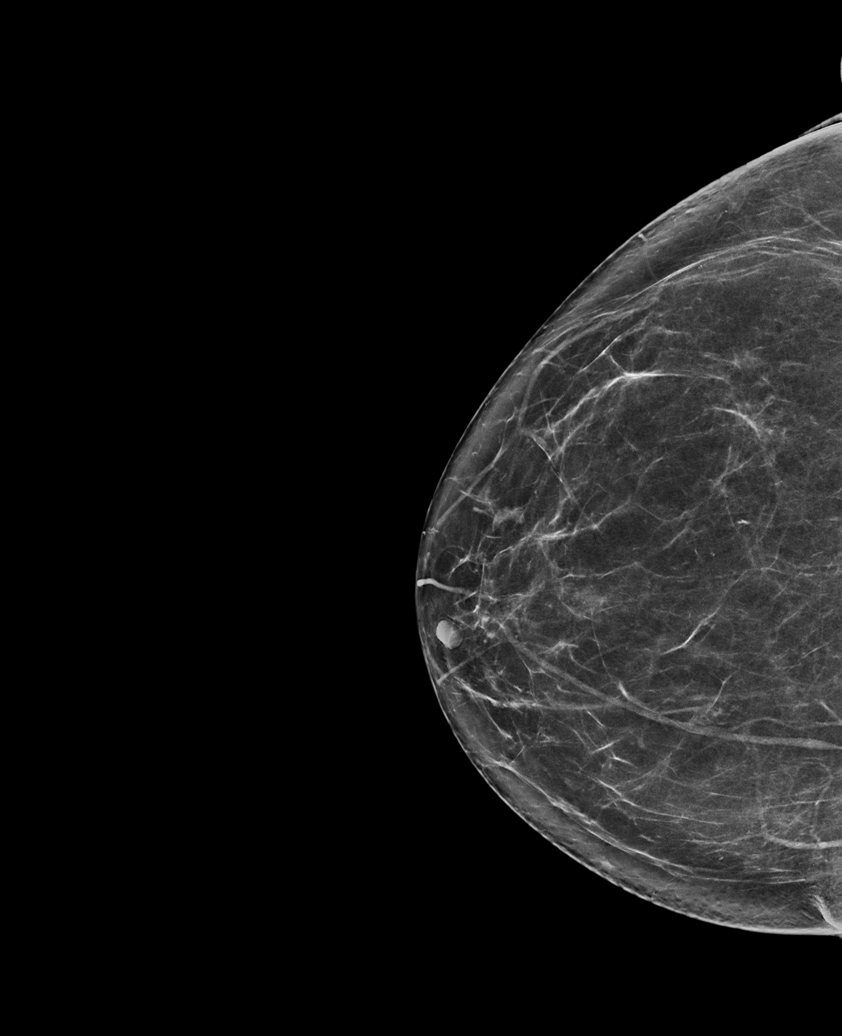

[R MLO tomo · tomo slice 39/77.0]
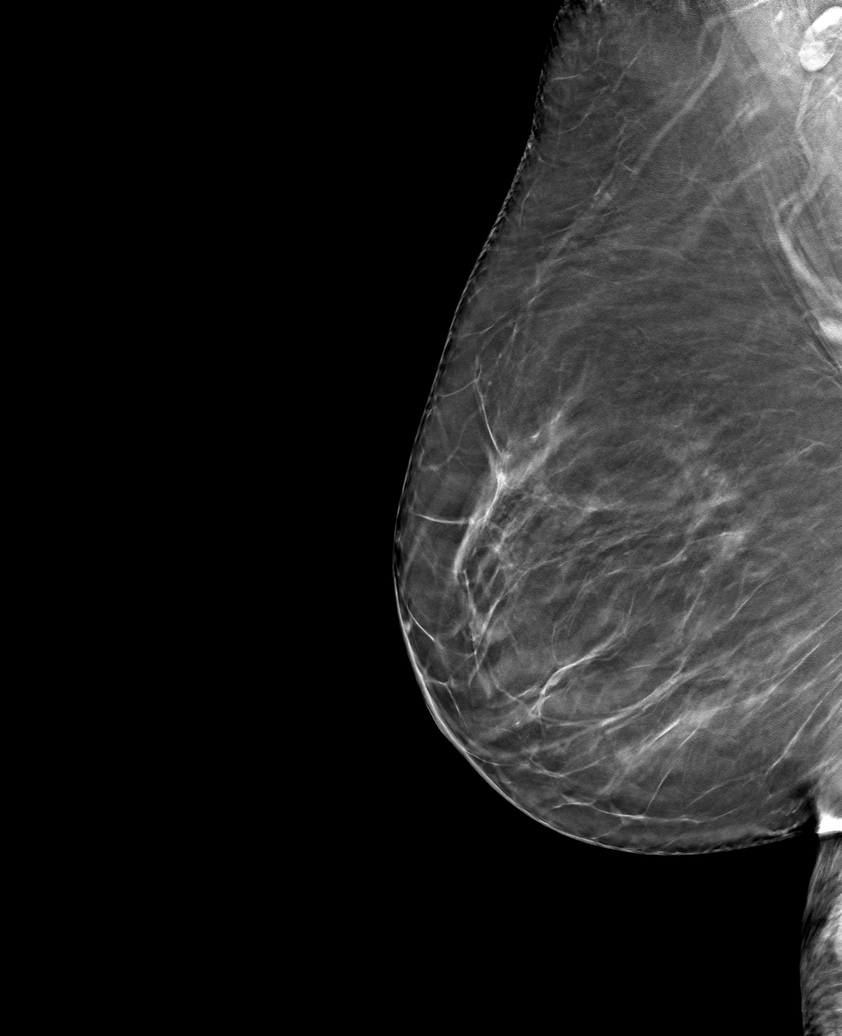

[6 of 30 positions shown; findings below may reference images not displayed]

ACR Breast Density Category b: There are scattered areas of
fibroglandular density.
FINDINGS: There are no findings suspicious for malignancy. Images were
processed with CAD.
IMPRESSION: No mammographic evidence of malignancy. A result letter of this
screening mammogram will be mailed directly to the patient.

RECOMMENDATION:
Screening mammogram in one year. (Code:[id number])

BI-RADS CATEGORY  1: Negative.

## 2021-06-25 ENCOUNTER — Other Ambulatory Visit: Payer: Self-pay

## 2021-06-25 ENCOUNTER — Ambulatory Visit (AMBULATORY_SURGERY_CENTER): Payer: Medicare HMO

## 2021-06-25 VITALS — Ht 70.0 in | Wt 175.0 lb

## 2021-06-25 DIAGNOSIS — Z1211 Encounter for screening for malignant neoplasm of colon: Secondary | ICD-10-CM

## 2021-06-25 MED ORDER — SUTAB 1479-225-188 MG PO TABS: 1.0000 | ORAL_TABLET | ORAL | 0 refills | Status: DC

## 2021-06-25 NOTE — Progress Notes (Signed)
Patient's pre-visit was done today over the phone with the patient due to COVID-19 pandemic. Name,DOB and address verified. Insurance verified. Patient denies any allergies to Eggs and Soy. Patient denies any problems with anesthesia/sedation. Patient denies taking diet pills or blood thinners. No home Oxygen. Packet of Prep instructions mailed to patient including a copy of a consent form-pt is aware. Patient understands to call us back with any questions or concerns. Patient is aware of our care-partner policy and 0000000 safety protocol.   Coupon for sutab sent to pharmcy and mailed to pt. maw  The patient is COVID-19 vaccinated, per patient.

## 2021-07-02 ENCOUNTER — Telehealth: Payer: Self-pay | Admitting: Internal Medicine

## 2021-07-02 NOTE — Telephone Encounter (Signed)
Patient calling to inform she hasn't received the prep pharmacy/coupon/ instructions in the mail... Plz advise thank you

## 2021-07-02 NOTE — Telephone Encounter (Signed)
Instructed pt packet takes 5-7 days to receive   Pt concerned about coupon information- I will call pharmacy and give coupon information over the phone - Called Donna [name]  gave them coupon for [facility] - brought price to $40 per Pharmacist   Pt states she did get her instructions on her My Chart and she will print them out  Told her she should receive her packet this week

## 2021-07-06 ENCOUNTER — Telehealth: Payer: Self-pay

## 2021-07-06 NOTE — Telephone Encounter (Signed)
Pt called wanting to know about getting the yellow fever booster for her upcoming trip to [place]. She states that she was told that if she had it before and it has been more than 10 years, she needs a booster. Informed pt that we do not have the yellow fever vaccine here and advised her to call the [facility] Department vaccine clinic. No further questions or concerns at this time.

## 2021-07-09 ENCOUNTER — Encounter: Payer: Self-pay | Admitting: Internal Medicine

## 2021-07-09 ENCOUNTER — Other Ambulatory Visit: Payer: Self-pay

## 2021-07-09 ENCOUNTER — Ambulatory Visit (AMBULATORY_SURGERY_CENTER): Payer: Medicare HMO | Admitting: Internal Medicine

## 2021-07-09 VITALS — BP 133/63 | HR 60 | Temp 97.6°F | Resp 12 | Ht 70.0 in | Wt 175.0 lb

## 2021-07-09 DIAGNOSIS — D124 Benign neoplasm of descending colon: Secondary | ICD-10-CM | POA: Diagnosis not present

## 2021-07-09 DIAGNOSIS — K639 Disease of intestine, unspecified: Secondary | ICD-10-CM

## 2021-07-09 DIAGNOSIS — D120 Benign neoplasm of cecum: Secondary | ICD-10-CM | POA: Diagnosis not present

## 2021-07-09 DIAGNOSIS — K635 Polyp of colon: Secondary | ICD-10-CM | POA: Diagnosis not present

## 2021-07-09 DIAGNOSIS — Z1211 Encounter for screening for malignant neoplasm of colon: Secondary | ICD-10-CM | POA: Diagnosis not present

## 2021-07-09 MED ORDER — SODIUM CHLORIDE 0.9 % IV SOLN: 500.0000 mL | INTRAVENOUS | Status: DC

## 2021-07-09 NOTE — Progress Notes (Signed)
Called to room to assist during endoscopic procedure.  Patient ID and intended procedure confirmed with present staff. Received instructions for my participation in the procedure from the performing physician.   Per Dr. Henrene Pastor, cecal nodule cb r/o adenoma.

## 2021-07-09 NOTE — Patient Instructions (Signed)
Handouts on polyps & hemorrhoids given to you today  Await pathology results on polyps removed     YOU HAD AN ENDOSCOPIC PROCEDURE TODAY AT [place]:   Refer to the procedure report that was given to you for any specific questions about what was found during the examination.  If the procedure report does not answer your questions, please call your gastroenterologist to clarify.  If you requested that your care partner not be given the details of your procedure findings, then the procedure report has been included in a sealed envelope for you to review at your convenience later.  YOU SHOULD EXPECT: Some feelings of bloating in the abdomen. Passage of more gas than usual.  Walking can help get rid of the air that was put into your GI tract during the procedure and reduce the bloating. If you had a lower endoscopy (such as a colonoscopy or flexible sigmoidoscopy) you may notice spotting of blood in your stool or on the toilet paper. If you underwent a bowel prep for your procedure, you may not have a normal bowel movement for a few days.  Please Note:  You might notice some irritation and congestion in your nose or some drainage.  This is from the oxygen used during your procedure.  There is no need for concern and it should clear up in a day or so.  SYMPTOMS TO REPORT IMMEDIATELY:  Following lower endoscopy (colonoscopy or flexible sigmoidoscopy):  Excessive amounts of blood in the stool  Significant tenderness or worsening of abdominal pains  Swelling of the abdomen that is new, acute  Fever of 100F or higher  For urgent or emergent issues, a gastroenterologist can be reached at any hour by calling [phone]. Do not use MyChart messaging for urgent concerns.    DIET:  We do recommend a small meal at first, but then you may proceed to your regular diet.  Drink plenty of fluids but you should avoid alcoholic beverages for 24 hours.  ACTIVITY:  You should plan to  take it easy for the rest of today and you should NOT DRIVE or use heavy machinery until tomorrow (because of the sedation medicines used during the test).    FOLLOW UP: Our staff will call the number listed on your records 48-72 hours following your procedure to check on you and address any questions or concerns that you may have regarding the information given to you following your procedure. If we do not reach you, we will leave a message.  We will attempt to reach you two times.  During this call, we will ask if you have developed any symptoms of COVID 19. If you develop any symptoms (ie: fever, flu-like symptoms, shortness of breath, cough etc.) before then, please call [phone].  If you test positive for Covid 19 in the 2 weeks post procedure, please call and report this information to [place].    If any biopsies were taken you will be contacted by phone or by letter within the next 1-3 weeks.  Please call us at [phone] if you have not heard about the biopsies in 3 weeks.    SIGNATURES/CONFIDENTIALITY: You and/or your care partner have signed paperwork which will be entered into your electronic medical record.  These signatures attest to the fact that that the information above on your After Visit Summary has been reviewed and is understood.  Full responsibility of the confidentiality of this discharge information lies with you and/or your  care-partner.  

## 2021-07-09 NOTE — Op Note (Signed)
[facility] Patient Name: Donna [name] Procedure Date: 07/09/2021 9:05 AM MRN: [id number] Endoscopist: Docia Chuck. Henrene Pastor , MD Age: 70 Referring MD:  Date of Birth: 11-26-1951 Gender: Female Account #: [id number] Procedure:                Colonoscopy with cold snare polypectomy x 2; with                            biopsies Indications:              Screening for colorectal malignant neoplasm Medicines:                Monitored Anesthesia Care Procedure:                Pre-Anesthesia Assessment:                           - Prior to the procedure, a History and Physical                            was performed, and patient medications and                            allergies were reviewed. The patient's tolerance of                            previous anesthesia was also reviewed. The risks                            and benefits of the procedure and the sedation                            options and risks were discussed with the patient.                            All questions were answered, and informed consent                            was obtained. Prior Anticoagulants: The patient has                            taken no previous anticoagulant or antiplatelet                            agents. ASA Grade Assessment: I - A normal, healthy                            patient. After reviewing the risks and benefits,                            the patient was deemed in satisfactory condition to                            undergo the procedure.  After obtaining informed consent, the colonoscope                            was passed under direct vision. Throughout the                            procedure, the patient's blood pressure, pulse, and                            oxygen saturations were monitored continuously. The                            CF [id number] was introduced through the anus                            and advanced to the the cecum,  identified by                            appendiceal orifice and ileocecal valve. The                            ileocecal valve, appendiceal orifice, and rectum                            were photographed. The quality of the bowel                            preparation was good. The colonoscopy was performed                            without difficulty. The patient tolerated the                            procedure well. The bowel preparation used was                            SUPREP via split dose instruction. Scope In: 9:22:21 AM Scope Out: 9:40:14 AM Scope Withdrawal Time: 0 hours 14 minutes 48 seconds  Total Procedure Duration: 0 hours 17 minutes 53 seconds  Findings:                 A less than 1 mm nodule was found in the cecum.                            Biopsies were taken with a cold forceps for                            histology.                           Two polyps were found in the descending colon and                            cecum. The polyps were 2 to 3 mm  in size. These                            polyps were removed with a cold snare. Resection                            and retrieval were complete.                           Internal hemorrhoids were found during                            retroflexion. The hemorrhoids were small.                           The exam was otherwise without abnormality on                            direct and retroflexion views. Complications:            No immediate complications. Estimated blood loss:                            None. Estimated Blood Loss:     Estimated blood loss: none. Impression:               - One less than 1 mm nodule in the cecum. Biopsied.                           - Two 2 to 3 mm polyps in the descending colon and                            in the cecum, removed with a cold snare. Resected                            and retrieved.                           - Internal hemorrhoids.                            - The examination was otherwise normal on direct                            and retroflexion views. Recommendation:           - Repeat colonoscopy in 7-10 years for surveillance.                           - Patient has a contact number available for                            emergencies. The signs and symptoms of potential                            delayed complications were discussed with the  patient. Return to normal activities tomorrow.                            Written discharge instructions were provided to the                            patient.                           - Resume previous diet.                           - Continue present medications.                           - Await pathology results. Docia Chuck. Henrene Pastor, MD 07/09/2021 9:49:16 AM This report has been signed electronically.

## 2021-07-09 NOTE — Progress Notes (Signed)
Pt's states no medical or surgical changes since previsit or office visit. VS by CW. 

## 2021-07-09 NOTE — Progress Notes (Signed)
Pt Drowsy. VSS. To PACU, report to RN. No anesthetic complications noted.  

## 2021-07-11 ENCOUNTER — Telehealth: Payer: Self-pay

## 2021-07-11 NOTE — Telephone Encounter (Signed)
  Follow up Call-  Call back number 07/09/2021  Post procedure Call Back phone  # [phone]  Permission to leave phone message Yes  Some recent data might be hidden     Patient questions:  Do you have a fever, pain , or abdominal swelling? No. Pain Score  0 *  Have you tolerated food without any problems? Yes.    Have you been able to return to your normal activities? Yes.    Do you have any questions about your discharge instructions: Diet   No. Medications  No. Follow up visit  No.  Do you have questions or concerns about your Care? No.  Actions: * If pain score is 4 or above: No action needed, pain <4.

## 2021-07-13 ENCOUNTER — Encounter: Payer: Self-pay | Admitting: Internal Medicine

## 2021-07-13 LAB — FECAL OCCULT BLOOD, IMMUNOCHEMICAL: IFOBT: NEGATIVE

## 2022-01-28 DIAGNOSIS — L820 Inflamed seborrheic keratosis: Secondary | ICD-10-CM | POA: Diagnosis not present

## 2022-01-28 DIAGNOSIS — L570 Actinic keratosis: Secondary | ICD-10-CM | POA: Diagnosis not present

## 2022-01-28 DIAGNOSIS — L821 Other seborrheic keratosis: Secondary | ICD-10-CM | POA: Diagnosis not present

## 2022-01-28 DIAGNOSIS — D1801 Hemangioma of skin and subcutaneous tissue: Secondary | ICD-10-CM | POA: Diagnosis not present

## 2022-02-12 ENCOUNTER — Telehealth: Payer: Self-pay | Admitting: Neurology

## 2022-02-12 NOTE — Telephone Encounter (Signed)
Patient left vm stating she is having some vertigo and balance issues. Doesn't know whether to see [place] or physical therapy. She hasn't had an appt in over a year. Please call patient to schedule appt for vertigo. Thanks. [phone]. ?

## 2022-02-13 ENCOUNTER — Ambulatory Visit (INDEPENDENT_AMBULATORY_CARE_PROVIDER_SITE_OTHER): Payer: Medicare HMO | Admitting: Physician Assistant

## 2022-02-13 ENCOUNTER — Other Ambulatory Visit: Payer: Self-pay

## 2022-02-13 VITALS — Ht 70.0 in | Wt 191.0 lb

## 2022-02-13 DIAGNOSIS — Z131 Encounter for screening for diabetes mellitus: Secondary | ICD-10-CM

## 2022-02-13 DIAGNOSIS — M1712 Unilateral primary osteoarthritis, left knee: Secondary | ICD-10-CM

## 2022-02-13 DIAGNOSIS — Z1329 Encounter for screening for other suspected endocrine disorder: Secondary | ICD-10-CM | POA: Diagnosis not present

## 2022-02-13 DIAGNOSIS — Z1159 Encounter for screening for other viral diseases: Secondary | ICD-10-CM

## 2022-02-13 DIAGNOSIS — Z79899 Other long term (current) drug therapy: Secondary | ICD-10-CM | POA: Diagnosis not present

## 2022-02-13 DIAGNOSIS — I951 Orthostatic hypotension: Secondary | ICD-10-CM

## 2022-02-13 DIAGNOSIS — Z23 Encounter for immunization: Secondary | ICD-10-CM

## 2022-02-13 DIAGNOSIS — R42 Dizziness and giddiness: Secondary | ICD-10-CM | POA: Diagnosis not present

## 2022-02-13 DIAGNOSIS — E7800 Pure hypercholesterolemia, unspecified: Secondary | ICD-10-CM | POA: Diagnosis not present

## 2022-02-13 MED ORDER — MELOXICAM 15 MG PO TABS: 7.5000 mg | ORAL_TABLET | Freq: Every day | ORAL | 0 refills | Status: DC

## 2022-02-13 MED ORDER — MECLIZINE HCL 25 MG PO TABS
25.0000 mg | ORAL_TABLET | Freq: Three times a day (TID) | ORAL | 0 refills | Status: DC | PRN
Start: 2022-02-13 — End: 2022-02-26

## 2022-02-13 MED ORDER — FLUTICASONE PROPIONATE 50 MCG/ACT NA SUSP: 2.0000 | Freq: Every day | NASAL | 1 refills | Status: DC

## 2022-02-13 NOTE — Patient Instructions (Addendum)
Flonase 2 sprays each nostril for 3 days then afrin OTC for 2 days then repeat ?Antivert as needed for dizziness ?Vertigo ?Vertigo is the feeling that you or the things around you are moving when they are not. This feeling can come and go at any time. Vertigo often goes away on its own. This condition can be dangerous if it happens when you are doing activities like driving or working with machines. ?Your doctor will do tests to find the cause of your vertigo. These tests will also help your doctor decide on the best treatment for you. ?Follow these instructions at home: ?Eating and drinking ?  ?Drink enough fluid to keep your pee (urine) pale yellow. ?Do not drink alcohol. ?Activity ?Return to your normal activities when your doctor says that it is safe. ?In the morning, first sit up on the side of the bed. When you feel okay, stand slowly while you hold onto something until you know that your balance is fine. ?Move slowly. Avoid sudden body or head movements or certain positions, as told by your doctor. ?Use a cane if you have trouble standing or walking. ?Sit down right away if you feel dizzy. ?Avoid doing any tasks or activities that can cause danger to you or others if you get dizzy. ?Avoid bending down if you feel dizzy. Place items in your home so that they are easy for you to reach without bending or leaning over. ?Do not drive or use machinery if you feel dizzy. ?General instructions ?Take over-the-counter and prescription medicines only as told by your doctor. ?Keep all follow-up visits. ?Contact a doctor if: ?Your medicine does not help your vertigo. ?Your problems get worse or you have new symptoms. ?You have a fever. ?You feel like you may vomit (nauseous), or this feeling gets worse. ?You start to vomit. ?Your family or friends see changes in how you act. ?You lose feeling (have numbness) in part of your body. ?You feel prickling and tingling in a part of your body. ?Get help right away if: ?You are  always dizzy. ?You faint. ?You get very bad headaches. ?You get a stiff neck. ?Bright light starts to bother you. ?You have trouble moving or talking. ?You feel weak in your hands, arms, or legs. ?You have changes in your hearing or in how you see (vision). ?These symptoms may be an emergency. Get help right away. Call your local emergency services (911 in the [place] ?Do not wait to see if the symptoms will go away. ?Do not drive yourself to the hospital. ?Summary ?Vertigo is the feeling that you or the things around you are moving when they are not. ?Your doctor will do tests to find the cause of your vertigo. ?You may be told to avoid some tasks, positions, or movements. ?Contact a doctor if your medicine is not helping, or if you have a fever, new symptoms, or a change in how you act. ?Get help right away if you get very bad headaches, or if you have changes in how you speak, hear, or see. ?This information is not intended to replace advice given to you by your health care provider. Make sure you discuss any questions you have with your health care provider. ?Document Revised: 10/18/2020 Document Reviewed: 10/18/2020 ?Elsevier Patient Education ? [place]. ? ? ?Orthostatic Hypotension ?Blood pressure is a measurement of how strongly, or weakly, your circulating blood is pressing against the walls of your arteries. Orthostatic hypotension is a drop in blood pressure that  can happen when you change positions, such as when you go from lying down to standing. ?Arteries are blood vessels that carry blood from your heart throughout your body. When blood pressure is too low, you may not get enough blood to your brain or to the rest of your organs. Orthostatic hypotension can cause light-headedness, sweating, rapid heartbeat, blurred vision, and fainting. These symptoms require further investigation into the cause. ?What are the causes? ?Orthostatic hypotension can be caused by many things, including: ?Sudden  changes in posture, such as standing up quickly after you have been sitting or lying down. ?Loss of blood (anemia) or loss of body fluids (dehydration). ?Heart problems, neurologic problems, or hormone problems. ?Pregnancy. ?Aging. The risk for this condition increases as you get older. ?Severe infection (sepsis). ?Certain medicines, such as medicines for high blood pressure or medicines that make the body lose excess fluids (diuretics). ?What are the signs or symptoms? ?Symptoms of this condition may include: ?Weakness, light-headedness, or dizziness. ?Sweating. ?Blurred vision. ?Tiredness (fatigue). ?Rapid heartbeat. ?Fainting, in severe cases. ?How is this diagnosed? ?This condition is diagnosed based on: ?Your symptoms and medical history. ?Your blood pressure measurements. Your health care provider will check your blood pressure when you are: ?Lying down. ?Sitting. ?Standing. ?A blood pressure reading is recorded as two numbers, such as "120 over 80" (or 120/80). The first ("top") number is called the systolic pressure. It is a measure of the pressure in your arteries as your heart beats. The second ("bottom") number is called the diastolic pressure. It is a measure of the pressure in your arteries when your heart relaxes between beats. Blood pressure is measured in a unit called mmHg. Healthy blood pressure for most adults is 120/80 mmHg. Orthostatic hypotension is defined as a 20 mmHg drop in systolic pressure or a 10 mmHg drop in diastolic pressure within 3 minutes of standing. ?Other information or tests that may be used to diagnose orthostatic hypotension include: ?Your other vital signs, such as your heart rate and temperature. ?Blood tests. ?An electrocardiogram (ECG) or echocardiogram. ?A Holter monitor. This is a device you wear that records your heart rhythm continuously, usually for 24-48 hours. ?Tilt table test. For this test, you will be safely secured to a table that moves you from a lying  position to an upright position. Your heart rhythm and blood pressure will be monitored during the test. ?How is this treated? ?This condition may be treated by: ?Changing your diet. This may involve eating more salt (sodium) or drinking more water. ?Changing the dosage of certain medicines you are taking that might be lowering your blood pressure. ?Correcting the underlying reason for the orthostatic hypotension. ?Wearing compression stockings. ?Taking medicines to raise your blood pressure. ?Avoiding actions that trigger symptoms. ?Follow these instructions at home: ?Medicines ?Take over-the-counter and prescription medicines only as told by your health care provider. ?Follow instructions from your health care provider about changing the dosage of your current medicines, if this applies. ?Do not stop or adjust any of your medicines on your own. ?Eating and drinking ? ?Drink enough fluid to keep your urine pale yellow. ?Eat extra salt only as directed. Do not add extra salt to your diet unless advised by your health care provider. ?Eat frequent, small meals. ?Avoid standing up suddenly after eating. ?General instructions ? ?Get up slowly from lying down or sitting positions. This gives your blood pressure a chance to adjust. ?Avoid hot showers and excessive heat as directed by your health care provider. ?  Engage in regular physical activity as directed by your health care provider. ?If you have compression stockings, wear them as told. ?Keep all follow-up visits. This is important. ?Contact a health care provider if: ?You have a fever for more than 2-3 days. ?You feel more thirsty than usual. ?You feel dizzy or weak. ?Get help right away if: ?You have chest pain. ?You have a fast or irregular heartbeat. ?You become sweaty or feel light-headed. ?You feel short of breath. ?You faint. ?You have any symptoms of a stroke. "BE FAST" is an easy way to remember the main warning signs of a stroke: ?B - Balance. Signs are  dizziness, sudden trouble walking, or loss of balance. ?E - Eyes. Signs are trouble seeing or a sudden change in vision. ?F - Face. Signs are sudden weakness or numbness of the face, or the face or eyelid drooping

## 2022-02-13 NOTE — Progress Notes (Signed)
? ?Subjective:  ? ? Patient ID: Donna [name], female    DOB: 07-31-1951, 71 y.o.   MRN: [id number] ? ?HPI ?Pt is a 71 yo female who presents to the clinic with dizziness for the last week. Over the weekend the dizziness was more room spinning with just even laying in bed or looking the wrong way. She got a little better and then Tuesday "she felt off balance all day". No fever, chills, cough, SOB. Not passed out. No recent travel.  ? ?.. ?Active Ambulatory Problems  ?  Diagnosis Date Noted  ? Primary osteoarthritis of left knee 06/10/2016  ? Elevated LDL cholesterol level 06/10/2016  ? Class 1 obesity due to excess calories without serious comorbidity with body mass index (BMI) of 32.0 to 32.9 in adult 07/26/2016  ? Overweight (BMI 25.0-29.9) 01/27/2017  ? Bilateral hearing loss 08/23/2018  ? Post-menopausal 08/23/2018  ? Osteopenia 09/02/2018  ? Plantar fasciitis, right 02/19/2019  ? Aphthous ulcer of mouth 10/11/2020  ? Proteinuria 10/11/2020  ? Leukocytes in urine 10/11/2020  ? Orthostatic hypotension 02/15/2022  ? Vertigo 02/15/2022  ? ?Resolved Ambulatory Problems  ?  Diagnosis Date Noted  ? Left knee pain 06/10/2016  ? Laceration 06/10/2016  ? Boil, labium 11/22/2016  ? Tear of right supraspinatus tendon 08/23/2018  ? ?Past Medical History:  ?Diagnosis Date  ? Arthritis   ? Rosacea   ? Tachycardia   ? ? ? ? ?Review of Systems ?See HPI.  ?   ?Objective:  ? Physical Exam ?Vitals reviewed.  ?Constitutional:   ?   Appearance: Normal appearance. She is obese.  ?HENT:  ?   Right Ear: Ear canal and external ear normal. There is no impacted cerumen.  ?   Left Ear: Tympanic membrane, ear canal and external ear normal. There is no impacted cerumen.  ?   Ears:  ?   Comments: Right TM appearance of fluid and some retraction. ?   Nose: Nose normal.  ?   Mouth/Throat:  ?   Mouth: Mucous membranes are moist.  ?   Pharynx: No oropharyngeal exudate or posterior oropharyngeal erythema.  ?Eyes:  ?   General:     ?   Right eye:  No discharge.     ?   Left eye: No discharge.  ?   Extraocular Movements: Extraocular movements intact.  ?   Conjunctiva/sclera: Conjunctivae normal.  ?   Pupils: Pupils are equal, round, and reactive to light.  ?Cardiovascular:  ?   Rate and Rhythm: Normal rate and regular rhythm.  ?   Pulses: Normal pulses.  ?   Heart sounds: Murmur heard.  ?Pulmonary:  ?   Effort: Pulmonary effort is normal.  ?   Breath sounds: Normal breath sounds.  ?Musculoskeletal:  ?   Right lower leg: No edema.  ?   Left lower leg: No edema.  ?Skin: ?   Findings: No rash.  ?Neurological:  ?   General: No focal deficit present.  ?   Mental Status: She is alert and oriented to person, place, and time.  ?   Comments: Negative dix hallpike  ?Psychiatric:     ?   Mood and Affect: Mood normal.  ? ? ? ? ? ? ?   ?Assessment & Plan:  ?..Donna [name] was seen today for follow-up and dizziness. ? ?Diagnoses and all orders for this visit: ? ?Vertigo ?-     meclizine (ANTIVERT) 25 MG tablet; Take 1 tablet (25 mg total) by mouth 3 (  three) times daily as needed for dizziness. ?-     fluticasone (FLONASE) 50 MCG/ACT nasal spray; Place 2 sprays into both nostrils daily. ? ?Elevated LDL cholesterol level ?-     Lipid Panel w/reflex Direct LDL ? ?Thyroid disorder screen ?-     TSH ? ?Diabetes mellitus screening ?-     COMPLETE METABOLIC PANEL WITH GFR ? ?Medication management ?-     CBC with Differential/Platelet ? ?Encounter for hepatitis C screening test for low risk patient ?-     Hepatitis C Antibody ? ?Primary osteoarthritis of left knee ?-     meloxicam (MOBIC) 15 MG tablet; Take 0.5-1 tablets (7.5-15 mg total) by mouth daily. ? ?Need for pneumococcal vaccination ?-     Pneumococcal polysaccharide vaccine 23-valent greater than or equal to 2yo subcutaneous/IM ? ?Orthostatic hypotension ? ? ?Dizziness described sounds more like vertigo but her orthostatics were significant for orthostatic hypotension.  ?Discussed both ?Start epley manuevers ?Make sure staying  hydrated ?Start flonase alternating with aftrin to help with ETD and fluid more behind right ear ?Antiver as needed for dizziness ?Follow up as needed or if symptoms persist or worsen.  ? ?

## 2022-02-15 ENCOUNTER — Encounter: Payer: Self-pay | Admitting: Physician Assistant

## 2022-02-15 DIAGNOSIS — R42 Dizziness and giddiness: Secondary | ICD-10-CM | POA: Insufficient documentation

## 2022-02-15 DIAGNOSIS — I951 Orthostatic hypotension: Secondary | ICD-10-CM | POA: Insufficient documentation

## 2022-02-19 DIAGNOSIS — Z131 Encounter for screening for diabetes mellitus: Secondary | ICD-10-CM | POA: Diagnosis not present

## 2022-02-19 DIAGNOSIS — Z79899 Other long term (current) drug therapy: Secondary | ICD-10-CM | POA: Diagnosis not present

## 2022-02-19 DIAGNOSIS — E7800 Pure hypercholesterolemia, unspecified: Secondary | ICD-10-CM | POA: Diagnosis not present

## 2022-02-19 DIAGNOSIS — Z1329 Encounter for screening for other suspected endocrine disorder: Secondary | ICD-10-CM | POA: Diagnosis not present

## 2022-02-19 DIAGNOSIS — Z1159 Encounter for screening for other viral diseases: Secondary | ICD-10-CM | POA: Diagnosis not present

## 2022-02-20 LAB — CBC WITH DIFFERENTIAL/PLATELET
Absolute Monocytes: 385 cells/uL (ref 200–950)
Basophils Absolute: 61 cells/uL (ref 0–200)
Basophils Relative: 1.1 %
Eosinophils Absolute: 132 cells/uL (ref 15–500)
Eosinophils Relative: 2.4 %
HCT: 43.3 % (ref 35.0–45.0)
Hemoglobin: 14.5 g/dL (ref 11.7–15.5)
Lymphs Abs: 2046 cells/uL (ref 850–3900)
MCH: 31.2 pg (ref 27.0–33.0)
MCHC: 33.5 g/dL (ref 32.0–36.0)
MCV: 93.1 fL (ref 80.0–100.0)
MPV: 10.6 fL (ref 7.5–12.5)
Monocytes Relative: 7 %
Neutro Abs: 2877 cells/uL (ref 1500–7800)
Neutrophils Relative %: 52.3 %
Platelets: 208 10*3/uL (ref 140–400)
RBC: 4.65 10*6/uL (ref 3.80–5.10)
RDW: 12.5 % (ref 11.0–15.0)
Total Lymphocyte: 37.2 %
WBC: 5.5 10*3/uL (ref 3.8–10.8)

## 2022-02-20 LAB — COMPLETE METABOLIC PANEL WITH GFR
AG Ratio: 1.3 (calc) (ref 1.0–2.5)
ALT: 15 U/L (ref 6–29)
AST: 18 U/L (ref 10–35)
Albumin: 3.7 g/dL (ref 3.6–5.1)
Alkaline phosphatase (APISO): 84 U/L (ref 37–153)
BUN/Creatinine Ratio: 19 (calc) (ref 6–22)
BUN: 20 mg/dL (ref 7–25)
CO2: 28 mmol/L (ref 20–32)
Calcium: 9 mg/dL (ref 8.6–10.4)
Chloride: 106 mmol/L (ref 98–110)
Creat: 1.04 mg/dL — ABNORMAL HIGH (ref 0.60–1.00)
Globulin: 2.8 g/dL (calc) (ref 1.9–3.7)
Glucose, Bld: 75 mg/dL (ref 65–99)
Potassium: 4 mmol/L (ref 3.5–5.3)
Sodium: 142 mmol/L (ref 135–146)
Total Bilirubin: 0.5 mg/dL (ref 0.2–1.2)
Total Protein: 6.5 g/dL (ref 6.1–8.1)
eGFR: 57 mL/min/{1.73_m2} — ABNORMAL LOW (ref >=60)

## 2022-02-20 LAB — HEPATITIS C ANTIBODY
Hepatitis C Ab: NONREACTIVE
SIGNAL TO CUT-OFF: 0.05 (ref <1.00)

## 2022-02-20 LAB — LIPID PANEL W/REFLEX DIRECT LDL
Cholesterol: 204 mg/dL — ABNORMAL HIGH (ref <200)
HDL: 81 mg/dL (ref >=50)
LDL Cholesterol (Calc): 100 mg/dL (calc) — ABNORMAL HIGH
Non-HDL Cholesterol (Calc): 123 mg/dL (calc) (ref <130)
Total CHOL/HDL Ratio: 2.5 (calc) (ref <5.0)
Triglycerides: 136 mg/dL (ref <150)

## 2022-02-20 LAB — TSH: TSH: 1.1 mIU/L (ref 0.40–4.50)

## 2022-02-20 NOTE — Progress Notes (Signed)
Khara,  ? ?HDL, good cholesterol, is amazing! ?LDL, bad cholesterol, looks good.  ?Kidney function is stable.  ?Glucose and liver look great.

## 2022-02-25 ENCOUNTER — Telehealth: Payer: Self-pay | Admitting: Neurology

## 2022-02-25 NOTE — Telephone Encounter (Signed)
Patient has been scheduled with Dr T for this issue since PCP is out of office the rest of the week. AM ?

## 2022-02-25 NOTE — Telephone Encounter (Signed)
Patient left vm stating she just noticed a hard, prominent lump on her hand near her knuckles. Would like to have this evaluated to find out what it is. Please call patient to schedule an appointment.  ?

## 2022-02-26 ENCOUNTER — Ambulatory Visit (INDEPENDENT_AMBULATORY_CARE_PROVIDER_SITE_OTHER): Payer: Medicare HMO | Admitting: Sports Medicine

## 2022-02-26 ENCOUNTER — Other Ambulatory Visit: Payer: Self-pay

## 2022-02-26 DIAGNOSIS — M67442 Ganglion, left hand: Secondary | ICD-10-CM

## 2022-02-26 NOTE — Progress Notes (Signed)
? ? ?  Procedures performed today:   ? ?None. ? ?Independent interpretation of notes and tests performed by another provider:  ? ?None. ? ?Brief History, Exam, Impression, and Recommendations:   ? ?Ganglion cyst of joint of finger of left hand ?Pleasant 71 year old female, she has recently noted a fullness between her second and third digits on the left hand. ?On exam she does have a well-defined, movable 1 cm mass consistent with a ganglion cyst. ?We did an unofficial ultrasound confirming hypoechoic cystic mass. ?I explained the anatomy and pathophysiology, she understands that we will simply watch this for the next 6 weeks as it is completely asymptomatic right now. ?If she really wants this gone I am happy to do a fenestration. ? ? ? ?___________________________________________ ?Gwen Her. Dianah Field, M.D., ABFM., CAQSM. ?Primary Care and Sports Medicine ?[place] ? ?Adjunct Instructor of Family Medicine  ?[employer] ?

## 2022-02-26 NOTE — Assessment & Plan Note (Signed)
Pleasant 71 year old female, she has recently noted a fullness between her second and third digits on the left hand. ?On exam she does have a well-defined, movable 1 cm mass consistent with a ganglion cyst. ?We did an unofficial ultrasound confirming hypoechoic cystic mass. ?I explained the anatomy and pathophysiology, she understands that we will simply watch this for the next 6 weeks as it is completely asymptomatic right now. ?If she really wants this gone I am happy to do a fenestration. ?

## 2022-05-21 DIAGNOSIS — Z0100 Encounter for examination of eyes and vision without abnormal findings: Secondary | ICD-10-CM | POA: Diagnosis not present

## 2022-05-21 DIAGNOSIS — H524 Presbyopia: Secondary | ICD-10-CM | POA: Diagnosis not present

## 2022-06-13 ENCOUNTER — Telehealth: Payer: Self-pay | Admitting: Neurology

## 2022-06-13 NOTE — Telephone Encounter (Signed)
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 She is wondering if she needs a Polio booster?   Also wondering if she needs to have another Typhoid vaccine (last was 11/12/2018).   Please advise.

## 2022-06-14 NOTE — Telephone Encounter (Signed)
I checked with Tanna Furry and looked on the paper "cheat sheet" there is nothing listed for that particular shot and [place] stated she does not know either and perhaps Levada Dy might know when she returns to work. Tanna Furry suggested the patient call her insurance company.Donna KitchenMarland [name]

## 2022-06-14 NOTE — Telephone Encounter (Signed)
Deirdre,   I don't think insurance company would know how much WE charge for this vaccine. Is there a way to find out this information for the patient?

## 2022-06-14 NOTE — Telephone Encounter (Signed)
Per Luvenia StarchAlden Hipp, Andric Kerce L, PA-C  You 9 minutes ago (3:11 PM)    She is ok to get the once life time polio booster in office. Typhoid is good for 5 years and last vaccine was 2019. You are good until 2024.     Patient made aware. She does want to get Polio booster. She wants to know the cost of this before she schedules nurse visit. Can we let her know this information and get her schedule?

## 2022-06-19 MED ORDER — CICLOPIROX 8 % EX SOLN: Freq: Every day | CUTANEOUS | 0 refills | Status: DC

## 2022-06-19 NOTE — Telephone Encounter (Signed)
Patient left another message asking about Polio vaccine cost through our office.   Donna [name] - can we find this out?   Patient also states she thinks she has a nail fungus. Has had on toe nails before but thinks it is on finger nails.   [name] - would she need appt?

## 2022-06-19 NOTE — Addendum Note (Signed)
Addended by: Donella Stade on: 06/19/2022 12:53 PM   Modules accepted: Orders

## 2022-06-21 NOTE — Telephone Encounter (Signed)
Patient made aware.

## 2022-06-26 ENCOUNTER — Telehealth: Payer: Self-pay

## 2022-06-26 NOTE — Telephone Encounter (Signed)
Initiated Prior authorization ORV:[id number] 8% solution Via: Covermymeds Case/Key:[id number] Status: approved as of 06/26/2022 Reason:This approval authorizes your coverage from 12/02/2021 - 12/01/2022 Notified Pt via: Mychart

## 2022-07-02 ENCOUNTER — Ambulatory Visit (INDEPENDENT_AMBULATORY_CARE_PROVIDER_SITE_OTHER): Payer: Medicare HMO | Admitting: Sports Medicine

## 2022-07-02 ENCOUNTER — Ambulatory Visit (INDEPENDENT_AMBULATORY_CARE_PROVIDER_SITE_OTHER): Payer: Medicare HMO

## 2022-07-02 DIAGNOSIS — M7542 Impingement syndrome of left shoulder: Secondary | ICD-10-CM | POA: Diagnosis not present

## 2022-07-02 DIAGNOSIS — M67442 Ganglion, left hand: Secondary | ICD-10-CM

## 2022-07-02 NOTE — Assessment & Plan Note (Signed)
Minimally symptomatic ganglion cyst between the second and third digits, aspiration/fenestration and injection today. Return to see me in 6 weeks, patient aware of the 50% recurrence rate. If this recurs we may consider referral to hand surgery.

## 2022-07-02 NOTE — Progress Notes (Signed)
    Procedures performed today:    Procedure: Real-time Ultrasound Guided aspiration/injection of left hand ganglion cyst Device: Samsung HS60  Verbal informed consent obtained.  Time-out conducted.  Noted no overlying erythema, induration, or other signs of local infection.  Skin prepped in a sterile fashion.  Local anesthesia: Topical Ethyl chloride.  With sterile technique and under real time ultrasound guidance: Noted hypoechoic ganglion between second and third MCPs, I advanced a 22-gauge needle, aspirated a scant amount of thick fluid, syringe switched and 0.5 cc lidocaine, 0.5 cc kenalog 40 injected easily Completed without difficulty  Advised to call if fevers/chills, erythema, induration, drainage, or persistent bleeding.  Images permanently stored and available for review in PACS.  Impression: Technically successful ultrasound guided aspiration/injection.  Independent interpretation of notes and tests performed by another provider:   None.  Brief History, Exam, Impression, and Recommendations:    Ganglion cyst of joint of finger of left hand Minimally symptomatic ganglion cyst between the second and third digits, aspiration/fenestration and injection today. Return to see me in 6 weeks, patient aware of the 50% recurrence rate. If this recurs we may consider referral to hand surgery.  Impingement syndrome, shoulder, left Chronic left shoulder pain localized at the joint line, worse with abduction, positive Neer sign, all other tests are unremarkable except for an empty can sign and weakness to abduction. Starting conservatively, x-rays and home physical therapy, return to see me in 6 weeks, injection if not better.    ____________________________________________ Gwen Her. Dianah Field, M.D., ABFM., CAQSM., AME. Primary Care and Sports Medicine [facility]  Adjunct Professor of [place] [employer]  [employer]  Risk manager

## 2022-07-02 NOTE — Assessment & Plan Note (Signed)
Chronic left shoulder pain localized at the joint line, worse with abduction, positive Neer sign, all other tests are unremarkable except for an empty can sign and weakness to abduction. Starting conservatively, x-rays and home physical therapy, return to see me in 6 weeks, injection if not better.

## 2022-07-08 ENCOUNTER — Ambulatory Visit (INDEPENDENT_AMBULATORY_CARE_PROVIDER_SITE_OTHER): Payer: Medicare HMO | Admitting: Physician Assistant

## 2022-07-08 DIAGNOSIS — Z1231 Encounter for screening mammogram for malignant neoplasm of breast: Secondary | ICD-10-CM | POA: Diagnosis not present

## 2022-07-08 DIAGNOSIS — Z780 Asymptomatic menopausal state: Secondary | ICD-10-CM | POA: Diagnosis not present

## 2022-07-08 DIAGNOSIS — Z0000 Encounter for general adult medical examination without abnormal findings: Secondary | ICD-10-CM | POA: Diagnosis not present

## 2022-07-08 NOTE — Patient Instructions (Addendum)
[place] Maintenance Summary and Written Plan of Care  Ms. Donna [name] ,  Thank you for allowing me to perform your Medicare Annual Wellness Visit and for your ongoing commitment to your health.   Health Maintenance & Immunization History Health Maintenance  Topic Date Due   COVID-19 Vaccine (4 - Booster for Moderna series) 07/24/2022 (Originally 03/12/2021)   Zoster Vaccines- Shingrix (1 of 2) 10/08/2022 (Originally 12/04/1969)   INFLUENZA VACCINE  03/02/2023 (Originally 07/02/2022)   DEXA SCAN  07/09/2023 (Originally 09/02/2021)   MAMMOGRAM  12/06/2022   COLONOSCOPY (Pts 45-25yr Insurance coverage will need to be confirmed)  07/09/2026   TETANUS/TDAP  11/09/2028   Pneumonia Vaccine 72 Years old  Completed   Hepatitis C Screening  Completed   HPV VACCINES  Aged Out   Immunization History  Administered Date(s) Administered   Fluad Quad(high Dose 65+) 08/18/2020   Influenza, High Dose Seasonal PF 09/25/2017, 08/28/2018   Influenza-Unspecified 08/15/2016, 10/01/2021   Moderna Sars-Covid-2 Vaccination 01/30/2020, 02/29/2020, 01/15/2021   Pneumococcal Conjugate-13 09/25/2017   Pneumococcal Polysaccharide-23 02/13/2022   Tdap 11/09/2018   Typhoid Live 11/12/2018    These are the patient goals that we discussed:  Goals Addressed               This Visit's Progress     Patient Stated (pt-stated)        07/08/2022 AWV Goal: Exercise for General Health  Patient will verbalize understanding of the benefits of increased physical activity: Exercising regularly is important. It will improve your overall fitness, flexibility, and endurance. Regular exercise also will improve your overall health. It can help you control your weight, reduce stress, and improve your bone density. Over the next year, patient will increase physical activity as tolerated with a goal of at least 150 minutes of moderate physical activity per week.  You can tell that you are  exercising at a moderate intensity if your heart starts beating faster and you start breathing faster but can still hold a conversation. Moderate-intensity exercise ideas include: Walking 1 mile (1.6 km) in about 15 minutes Biking Hiking Golfing Dancing Water aerobics Patient will verbalize understanding of everyday activities that increase physical activity by providing examples like the following: Yard work, such as: PSales promotion account executiveGardening Washing windows or floors Patient will be able to explain general safety guidelines for exercising:  Before you start a new exercise program, talk with your health care provider. Do not exercise so much that you hurt yourself, feel dizzy, or get very short of breath. Wear comfortable clothes and wear shoes with good support. Drink plenty of water while you exercise to prevent dehydration or heat stroke. Work out until your breathing and your heartbeat get faster.          This is a list of Health Maintenance Items that are overdue or due now: Shingrix vaccine    Orders/Referrals Placed Today: Orders Placed This Encounter  Procedures   DEXAScan    Standing Status:   Future    Standing Expiration Date:   07/09/2023    Scheduling Instructions:     Please call patient to schedule. She will be due in October, 2023.    Order Specific Question:   Reason for exam:    Answer:   Post menopausal    Order Specific Question:   Preferred imaging location?    Answer:   [facility] 3D SCREEN BREAST BILATERAL    Standing Status:   Future    Standing Expiration Date:   07/09/2023    Scheduling Instructions:     Please call patient to schedule.    Order Specific Question:   Reason for Exam (SYMPTOM  OR DIAGNOSIS REQUIRED)    Answer:   Breast cancer screening    Order Specific Question:   Preferred imaging location?    Answer:   [name]   (Contact our referral department at [phone] if you have not spoken with someone about your referral appointment within the next 5 days)    Follow-up Plan Follow-up with Donella Stade, PA-C as planned Medicare wellness visit in one year. Patient will access AVS on my chart.      Health Maintenance, Female Adopting a healthy lifestyle and getting preventive care are important in promoting health and wellness. Ask your health care provider about: The right schedule for you to have regular tests and exams. Things you can do on your own to prevent diseases and keep yourself healthy. What should I know about diet, weight, and exercise? Eat a healthy diet  Eat a diet that includes plenty of vegetables, fruits, low-fat dairy products, and lean protein. Do not eat a lot of foods that are high in solid fats, added sugars, or sodium. Maintain a healthy weight Body mass index (BMI) is used to identify weight problems. It estimates body fat based on height and weight. Your health care provider can help determine your BMI and help you achieve or maintain a healthy weight. Get regular exercise Get regular exercise. This is one of the most important things you can do for your health. Most adults should: Exercise for at least 150 minutes each week. The exercise should increase your heart rate and make you sweat (moderate-intensity exercise). Do strengthening exercises at least twice a week. This is in addition to the moderate-intensity exercise. Spend less time sitting. Even light physical activity can be beneficial. Watch cholesterol and blood lipids Have your blood tested for lipids and cholesterol at 72 years of age, then have this test every 5 years. Have your cholesterol levels checked more often if: Your lipid or cholesterol levels are high. You are older than 72 years of age. You are at high risk for heart disease. What should I know about cancer screening? Depending  on your health history and family history, you may need to have cancer screening at various ages. This may include screening for: Breast cancer. Cervical cancer. Colorectal cancer. Skin cancer. Lung cancer. What should I know about heart disease, diabetes, and high blood pressure? Blood pressure and heart disease High blood pressure causes heart disease and increases the risk of stroke. This is more likely to develop in people who have high blood pressure readings or are overweight. Have your blood pressure checked: Every 3-5 years if you are 56-107 years of age. Every year if you are 52 years old or older. Diabetes Have regular diabetes screenings. This checks your fasting blood sugar level. Have the screening done: Once every three years after age 24 if you are at a normal weight and have a low risk for diabetes. More often and at a younger age if you are overweight or have a high risk for diabetes. What should I know about preventing infection? Hepatitis B If you have a higher risk for hepatitis B, you should be screened for this virus. Talk with your health care provider to find out  if you are at risk for hepatitis B infection. Hepatitis C Testing is recommended for: Everyone born from [birth year] through 1966. Anyone with known risk factors for hepatitis C. Sexually transmitted infections (STIs) Get screened for STIs, including gonorrhea and chlamydia, if: You are sexually active and are younger than 72 years of age. You are older than 72 years of age and your health care provider tells you that you are at risk for this type of infection. Your sexual activity has changed since you were last screened, and you are at increased risk for chlamydia or gonorrhea. Ask your health care provider if you are at risk. Ask your health care provider about whether you are at high risk for HIV. Your health care provider may recommend a prescription medicine to help prevent HIV infection. If you choose to  take medicine to prevent HIV, you should first get tested for HIV. You should then be tested every 3 months for as long as you are taking the medicine. Pregnancy If you are about to stop having your period (premenopausal) and you may become pregnant, seek counseling before you get pregnant. Take 400 to 800 micrograms (mcg) of folic acid every day if you become pregnant. Ask for birth control (contraception) if you want to prevent pregnancy. Osteoporosis and menopause Osteoporosis is a disease in which the bones lose minerals and strength with aging. This can result in bone fractures. If you are 76 years old or older, or if you are at risk for osteoporosis and fractures, ask your health care provider if you should: Be screened for bone loss. Take a calcium or vitamin D supplement to lower your risk of fractures. Be given hormone replacement therapy (HRT) to treat symptoms of menopause. Follow these instructions at home: Alcohol use Do not drink alcohol if: Your health care provider tells you not to drink. You are pregnant, may be pregnant, or are planning to become pregnant. If you drink alcohol: Limit how much you have to: 0-1 drink a day. Know how much alcohol is in your drink. In the [place] one drink equals one 12 oz bottle of beer (355 mL), one 5 oz glass of wine (148 mL), or one 1 oz glass of hard liquor (44 mL). Lifestyle Do not use any products that contain nicotine or tobacco. These products include cigarettes, chewing tobacco, and vaping devices, such as e-cigarettes. If you need help quitting, ask your health care provider. Do not use street drugs. Do not share needles. Ask your health care provider for help if you need support or information about quitting drugs. General instructions Schedule regular health, dental, and eye exams. Stay current with your vaccines. Tell your health care provider if: You often feel depressed. You have ever been abused or do not feel safe at  home. Summary Adopting a healthy lifestyle and getting preventive care are important in promoting health and wellness. Follow your health care provider's instructions about healthy diet, exercising, and getting tested or screened for diseases. Follow your health care provider's instructions on monitoring your cholesterol and blood pressure. This information is not intended to replace advice given to you by your health care provider. Make sure you discuss any questions you have with your health care provider. Document Revised: 04/09/2021 Document Reviewed: 04/09/2021 Elsevier Patient Education  [place].

## 2022-07-08 NOTE — Progress Notes (Signed)
MEDICARE ANNUAL WELLNESS VISIT  07/08/2022  Telephone Visit Disclaimer This Medicare AWV was conducted by telephone due to national recommendations for restrictions regarding the COVID-19 Pandemic (e.g. social distancing).  I verified, using two identifiers, that I am speaking with Donna [name] or their authorized healthcare agent. I discussed the limitations, risks, security, and privacy concerns of performing an evaluation and management service by telephone and the potential availability of an in-person appointment in the future. The patient expressed understanding and agreed to proceed.  Location of Patient: Home Location of Provider (nurse):  In the office.  Subjective:    Donna [name] is a 71 y.o. female patient of Alden Hipp, Royetta Car, PA-C who had a [employer] Visit today via telephone. Donna [name] is Retired and lives alone. she has 2 children. she reports that she is socially active and does interact with friends/family regularly. she is minimally physically active and enjoys to travel, read and volunteer in her free time..  Patient Care Team: Lavada Mesi as PCP - General (Family Medicine)     07/08/2022    2:08 PM 07/09/2021    8:19 AM 02/19/2021    8:15 AM 05/28/2019   10:12 AM 04/15/2019    8:16 AM 04/09/2019    3:27 PM 07/03/2016    2:02 PM  Advanced Directives  Does Patient Have a Medical Advance Directive? Yes Yes Yes Yes Yes Yes No  Type of Advance Directive Living will [place];Living will [place];Living will [place];Living will  Living will   Does patient want to make changes to medical advance directive? No - Patient declined No - Patient declined No - Patient declined   No - Patient declined   Copy of [place] in Chart?  No - copy requested No - copy requested Yes - validated most recent copy scanned in chart (See row information) No - copy requested No - copy  requested   Would patient like information on creating a medical advance directive?       No - patient declined information    Hospital Utilization Over the Past 12 Months: # of hospitalizations or ER visits: 0 # of surgeries: 0  Review of Systems    Patient reports that her overall health is unchanged compared to last year.  History obtained from chart review and the patient  Patient Reported Readings (BP, Pulse, CBG, Weight, etc) none  Pain Assessment Pain : No/denies pain     Current Medications & Allergies (verified) Allergies as of 07/08/2022       Reactions   Shellfish Allergy Nausea And Vomiting        Medication List        Accurate as of July 08, 2022  2:27 PM. If you have any questions, ask your nurse or doctor.          ciclopirox 8 % solution Commonly known as: PENLAC Apply topically at bedtime. Apply over nail and surrounding skin. Apply daily over previous coat. After seven (7) days, may remove with alcohol and continue cycle.   fluticasone 50 MCG/ACT nasal spray Commonly known as: FLONASE Place 2 sprays into both nostrils daily. What changed:  when to take this reasons to take this   meloxicam 15 MG tablet Commonly known as: Mobic Take 0.5-1 tablets (7.5-15 mg total) by mouth daily. What changed:  when to take this reasons to take this   SENIOR MULTIVITAMIN PLUS PO Take by  mouth.        History (reviewed): Past Medical History:  Diagnosis Date   Allergy    Arthritis    Rosacea    Tachycardia    Past Surgical History:  Procedure Laterality Date   COLONOSCOPY     dilation curretage     endometrial tumor     NOSE SURGERY  12/02/1958   PARTIAL HYSTERECTOMY  12/02/1977   removed one tube and one ovary    SHOULDER ARTHROSCOPY WITH ROTATOR CUFF REPAIR AND SUBACROMIAL DECOMPRESSION Right 04/15/2019   Procedure: RIGHT SHOULDER ARTHROSCOPY WITH ROTATOR CUFF REPAIR AND SUBACROMIAL DECOMPRESSION, DISTAL CLAVICULECTOMY, BICEPS  TENODESIS;  Surgeon: Hiram Gash, MD;  Location: [facility] [facility];  Service: Orthopedics;  Laterality: Right;   TONSILLECTOMY  12/03/1955   Family History  Problem Relation Age of Onset   Colon polyps Father    Liver disease Father    Breast cancer Sister    Diabetes Paternal Uncle    Stroke Paternal Grandmother    Colon cancer Neg Hx    Esophageal cancer Neg Hx    Rectal cancer Neg Hx    Stomach cancer Neg Hx    Social History   Socioeconomic History   Marital status: Widowed    Spouse name: Not on file   Number of children: 2   Years of education: Bachelor's degree   Highest education level: Bachelor's degree (e.g., BA, AB, BS)  Occupational History   Occupation: Retired.  Tobacco Use   Smoking status: Never   Smokeless tobacco: Never  Vaping Use   Vaping Use: Never used  Substance and Sexual Activity   Alcohol use: Yes    Comment: occasionally   Drug use: No   Sexual activity: Not Currently    Birth control/protection: Post-menopausal  Other Topics Concern   Not on file  Social History Narrative   Lives alone. Has two children. She likes to travel, read and volunteer in her free time.   Social Determinants of Health   Financial Resource Strain: Low Risk  (07/07/2022)   Overall Financial Resource Strain (CARDIA)    Difficulty of Paying Living Expenses: Not hard at all  Food Insecurity: No Food Insecurity (07/07/2022)   Hunger Vital Sign    Worried About Running Out of Food in the Last Year: Never true    Ran Out of Food in the Last Year: Never true  Transportation Needs: No Transportation Needs (07/07/2022)   PRAPARE - Hydrologist (Medical): No    Lack of Transportation (Non-Medical): No  Physical Activity: Inactive (07/07/2022)   Exercise Vital Sign    Days of Exercise per Week: 0 days    Minutes of Exercise per Session: 0 min  Stress: No Stress Concern Present (07/07/2022)   [place]    Feeling of Stress : Not at all  Social Connections: Moderately Integrated (07/08/2022)   Social Connection and Isolation Panel [NHANES]    Frequency of Communication with Friends and Family: More than three times a week    Frequency of Social Gatherings with Friends and Family: Three times a week    Attends Religious Services: More than 4 times per year    Active Member of Clubs or Organizations: Yes    Attends Archivist Meetings: More than 4 times per year    Marital Status: Widowed    Activities of Daily Living    07/08/2022  2:04 PM 07/07/2022    3:15 PM  In your present state of health, do you have any difficulty performing the following activities:  Hearing?  0  Vision?  0  Difficulty concentrating or making decisions?  0  Walking or climbing stairs?  0  Dressing or bathing?  0  Doing errands, shopping?  0  Preparing Food and eating ?  N  Using the Toilet?  N  In the past six months, have you accidently leaked urine?  Y  Comment sometimes   Do you have problems with loss of bowel control?  N  Managing your Medications?  N  Managing your Finances?  N  Housekeeping or managing your Housekeeping?  N    Patient Education/ Literacy How often do you need to have someone help you when you read instructions, pamphlets, or other written materials from your doctor or pharmacy?: 1 - Never What is the last grade level you completed in school?: [employer] (2 Bachelor's degree)  Exercise Current Exercise Habits: The patient does not participate in regular exercise at present, Exercise limited by: None identified  Diet Patient reports consuming 3 meals a day and 1 snack(s) a day Patient reports that her primary diet is: Regular Patient reports that she does have regular access to food.   Depression Screen    07/08/2022    2:06 PM 02/13/2022   10:21 AM 02/19/2021    8:16 AM 08/11/2018    3:01 PM 06/10/2016    2:46 PM  PHQ 2/9  Scores  PHQ - 2 Score 0 0 0 0 0  PHQ- 9 Score    4      Fall Risk    07/08/2022    2:06 PM 07/07/2022    3:15 PM 02/13/2022   10:21 AM 02/19/2021    8:16 AM 11/06/2017    5:29 PM  [place] in the past year? 0 0 0 0 No  Comment     [facility] Telephone Survey: data to providers prior to load  Number falls in past yr: 0  0 0   Injury with Fall? 0  0 0   Risk for fall due to : No Fall Risks  No Fall Risks No Fall Risks   Follow up Falls evaluation completed  Falls evaluation completed Falls evaluation completed      Objective:  Westlynn Fifer seemed alert and oriented and she participated appropriately during our telephone visit.  Blood Pressure Weight BMI  BP Readings from Last 3 Encounters:  07/09/21 133/63  02/19/21 (!) 117/59  10/10/20 127/63   Wt Readings from Last 3 Encounters:  02/13/22 191 lb (86.6 kg)  07/09/21 175 lb (79.4 kg)  06/25/21 175 lb (79.4 kg)   BMI Readings from Last 1 Encounters:  02/13/22 27.41 kg/m    *Unable to obtain current vital signs, weight, and BMI due to telephone visit type  Hearing/Vision  [place] did not seem to have difficulty with hearing/understanding during the telephone conversation Reports that she has had a formal eye exam by an eye care professional within the past year Reports that she has not had a formal hearing evaluation within the past year *Unable to fully assess hearing and vision during telephone visit type  Cognitive Function:    07/08/2022    2:09 PM 02/19/2021    8:31 AM  6CIT Screen  What Year? 0 points 0 points  What month? 0 points 0 points  What time? 0 points 0  points  Count back from 20 0 points 0 points  Months in reverse 0 points 0 points  Repeat phrase 0 points 0 points  Total Score 0 points 0 points   (Normal:0-7, Significant for Dysfunction: >8)  Normal Cognitive Function Screening: Yes   Immunization & Health Maintenance Record Immunization History  Administered Date(s) Administered   Fluad  Quad(high Dose 65+) 08/18/2020   Influenza, High Dose Seasonal PF 09/25/2017, 08/28/2018   Influenza-Unspecified 08/15/2016, 10/01/2021   Moderna Sars-Covid-2 Vaccination 01/30/2020, 02/29/2020, 01/15/2021   Pneumococcal Conjugate-13 09/25/2017   Pneumococcal Polysaccharide-23 02/13/2022   Tdap 11/09/2018   Typhoid Live 11/12/2018    Health Maintenance  Topic Date Due   COVID-19 Vaccine (4 - Booster for Moderna series) 07/24/2022 (Originally 03/12/2021)   Zoster Vaccines- Shingrix (1 of 2) 10/08/2022 (Originally 12/04/1969)   INFLUENZA VACCINE  03/02/2023 (Originally 07/02/2022)   DEXA SCAN  07/09/2023 (Originally 09/02/2021)   MAMMOGRAM  12/06/2022   COLONOSCOPY (Pts 45-41yr Insurance coverage will need to be confirmed)  07/09/2026   TETANUS/TDAP  11/09/2028   Pneumonia Vaccine 71 Years old  Completed   Hepatitis C Screening  Completed   HPV VACCINES  Aged Out       Assessment  This is a routine wellness examination for [employer]  Health Maintenance: Due or Overdue There are no preventive care reminders to display for this patient.   RDaliyah Sramekdoes not need a referral for Community Assistance: Care Management:   no Social Work:    no Prescription Assistance:  no Nutrition/Diabetes Education:  no   Plan:  Personalized Goals  Goals Addressed               This Visit's Progress     Patient Stated (pt-stated)        07/08/2022 AWV Goal: Exercise for General Health  Patient will verbalize understanding of the benefits of increased physical activity: Exercising regularly is important. It will improve your overall fitness, flexibility, and endurance. Regular exercise also will improve your overall health. It can help you control your weight, reduce stress, and improve your bone density. Over the next year, patient will increase physical activity as tolerated with a goal of at least 150 minutes of moderate physical activity per week.  You can tell that you  are exercising at a moderate intensity if your heart starts beating faster and you start breathing faster but can still hold a conversation. Moderate-intensity exercise ideas include: Walking 1 mile (1.6 km) in about 15 minutes Biking Hiking Golfing Dancing Water aerobics Patient will verbalize understanding of everyday activities that increase physical activity by providing examples like the following: Yard work, such as: PSales promotion account executiveGardening Washing windows or floors Patient will be able to explain general safety guidelines for exercising:  Before you start a new exercise program, talk with your health care provider. Do not exercise so much that you hurt yourself, feel dizzy, or get very short of breath. Wear comfortable clothes and wear shoes with good support. Drink plenty of water while you exercise to prevent dehydration or heat stroke. Work out until your breathing and your heartbeat get faster.        Personalized Health Maintenance & Screening Recommendations  Shingrix vaccine  Lung Cancer Screening Recommended: no (Low Dose CT Chest recommended if Age 72-80years, 30 pack-year currently smoking OR have quit w/in past 15 years) Hepatitis C Screening recommended: no HIV  Screening recommended: no  Advanced Directives: Written information was not prepared per patient's request.  Referrals & Orders Orders Placed This Encounter  Procedures   DEXAScan   Mammogram 3D SCREEN BREAST BILATERAL    Follow-up Plan Follow-up with Donella Stade, PA-C as planned Medicare wellness visit in one year. Patient will access AVS on my chart.   I have personally reviewed and noted the following in the patient's chart:   Medical and social history Use of alcohol, tobacco or illicit drugs  Current medications and supplements Functional ability and status Nutritional status Physical  activity Advanced directives List of other physicians Hospitalizations, surgeries, and ER visits in previous 12 months Vitals Screenings to include cognitive, depression, and falls Referrals and appointments  In addition, I have reviewed and discussed with Donna [name] certain preventive protocols, quality metrics, and best practice recommendations. A written personalized care plan for preventive services as well as general preventive health recommendations is available and can be mailed to the patient at her request.      Tinnie Gens, RN BSN  07/08/2022

## 2022-07-17 ENCOUNTER — Ambulatory Visit (INDEPENDENT_AMBULATORY_CARE_PROVIDER_SITE_OTHER): Payer: Medicare HMO

## 2022-07-17 DIAGNOSIS — Z0000 Encounter for general adult medical examination without abnormal findings: Secondary | ICD-10-CM

## 2022-07-17 DIAGNOSIS — M85852 Other specified disorders of bone density and structure, left thigh: Secondary | ICD-10-CM | POA: Diagnosis not present

## 2022-07-17 DIAGNOSIS — Z780 Asymptomatic menopausal state: Secondary | ICD-10-CM | POA: Diagnosis not present

## 2022-07-17 NOTE — Progress Notes (Signed)
Bone density worsened a little bit from -1.3 to -1.7. continues to be in osteopenia(low bone mass range) continue vitamin D and calcium daily for prevention along with low weight bearing exercise.

## 2022-07-19 ENCOUNTER — Telehealth: Payer: Self-pay | Admitting: Neurology

## 2022-07-19 NOTE — Telephone Encounter (Signed)
Patient made aware. She is going out of the country for 6 weeks. She will think this over and call back when she is back.

## 2022-07-19 NOTE — Telephone Encounter (Signed)
Spoke with patient. She states  ciclopirox (PENLAC) 8 % solution 6.6 mL 0 06/19/2022    Sig - Route: Apply topically at bedtime. Apply over nail and surrounding skin. Apply daily over previous coat. After seven (7) days, may remove with alcohol and continue cycle. - Topical   Sent to pharmacy as: ciclopirox (PENLAC) 8 % solution   E-Prescribing Status: Receipt confirmed by pharmacy (06/19/2022 12:53 PM EDT)   Did not help at all. She took for two weeks. Does she need to use longer or alternative?

## 2022-09-13 ENCOUNTER — Telehealth: Payer: Self-pay

## 2022-09-13 NOTE — Telephone Encounter (Signed)
Patient left a vm msg stating that she is having UTI issues and is requesting a antibiotic rx. Per patient, she has been taking an OTC medication with no relief for 10 days.   Please contact the patient and schedule an appointment with provider for an evaluation. Thanks in advance.

## 2022-09-16 ENCOUNTER — Ambulatory Visit (INDEPENDENT_AMBULATORY_CARE_PROVIDER_SITE_OTHER): Payer: Medicare HMO | Admitting: Physician Assistant

## 2022-09-16 ENCOUNTER — Encounter: Payer: Self-pay | Admitting: Physician Assistant

## 2022-09-16 VITALS — BP 144/75 | HR 63 | Temp 97.7°F | Ht 70.0 in | Wt 190.0 lb

## 2022-09-16 DIAGNOSIS — R3915 Urgency of urination: Secondary | ICD-10-CM | POA: Diagnosis not present

## 2022-09-16 DIAGNOSIS — N898 Other specified noninflammatory disorders of vagina: Secondary | ICD-10-CM | POA: Diagnosis not present

## 2022-09-16 DIAGNOSIS — R8290 Unspecified abnormal findings in urine: Secondary | ICD-10-CM

## 2022-09-16 LAB — POCT URINALYSIS DIP (CLINITEK)
Bilirubin, UA: NEGATIVE
Glucose, UA: NEGATIVE mg/dL
Nitrite, UA: NEGATIVE
POC PROTEIN,UA: NEGATIVE
Spec Grav, UA: 1.02 (ref 1.010–1.025)
Urobilinogen, UA: 0.2 E.U./dL
pH, UA: 5.5 (ref 5.0–8.0)

## 2022-09-16 MED ORDER — PHENAZOPYRIDINE HCL 200 MG PO TABS: 200.0000 mg | ORAL_TABLET | Freq: Three times a day (TID) | ORAL | 0 refills | Status: AC

## 2022-09-16 NOTE — Progress Notes (Unsigned)
Acute Office Visit  Subjective:     Patient ID: Donna [name], female    DOB: 11-10-1951, 71 y.o.   MRN: [id number]  Chief Complaint  Patient presents with   Urinary Tract Infection    HPI Patient is in today with urinary symptoms intermittently for the last 2 weeks. She has had urinary urgency and odor. She has had a few twinges of pain and discomfort. No fever, chills, abdominal pain, flank pain, nausea, vomiting. She has taken AZO a few times but not recently. No recent UTI. No vaginal itching or discharge.   .. Active Ambulatory Problems    Diagnosis Date Noted   Primary osteoarthritis of left knee 06/10/2016   Elevated LDL cholesterol level 06/10/2016   Class 1 obesity due to excess calories without serious comorbidity with body mass index (BMI) of 32.0 to 32.9 in adult 07/26/2016   Overweight (BMI 25.0-29.9) 01/27/2017   Bilateral hearing loss 08/23/2018   Post-menopausal 08/23/2018   Osteopenia 09/02/2018   Plantar fasciitis, right 02/19/2019   Aphthous ulcer of mouth 10/11/2020   Proteinuria 10/11/2020   Leukocytes in urine 10/11/2020   Orthostatic hypotension 02/15/2022   Vertigo 02/15/2022   Ganglion cyst of joint of finger of left hand 02/26/2022   Impingement syndrome, shoulder, left 07/02/2022   Resolved Ambulatory Problems    Diagnosis Date Noted   Left knee pain 06/10/2016   Laceration 06/10/2016   Boil, labium 11/22/2016   Tear of right supraspinatus tendon 08/23/2018   Past Medical History:  Diagnosis Date   Allergy    Arthritis    Rosacea    Tachycardia      ROS  See HPI.     Objective:    BP (!) 144/75   Pulse 63   Temp 97.7 F (36.5 C) (Oral)   Ht '5\' 10"'$  (1.778 m)   Wt 190 lb (86.2 kg)   SpO2 100%   BMI 27.26 kg/m  BP Readings from Last 3 Encounters:  09/16/22 (!) 144/75  07/09/21 133/63  02/19/21 (!) 117/59   Wt Readings from Last 3 Encounters:  09/16/22 190 lb (86.2 kg)  02/13/22 191 lb (86.6 kg)  07/09/21 175 lb (79.4  kg)      Physical Exam Constitutional:      Appearance: Normal appearance. She is obese.  HENT:     Head: Normocephalic.  Cardiovascular:     Rate and Rhythm: Normal rate and regular rhythm.  Pulmonary:     Effort: Pulmonary effort is normal.  Abdominal:     General: There is no distension.     Palpations: Abdomen is soft. There is no mass.     Tenderness: There is no abdominal tenderness. There is no right CVA tenderness, left CVA tenderness, guarding or rebound.     Hernia: No hernia is present.  Musculoskeletal:     Cervical back: Normal range of motion and neck supple.     Right lower leg: No edema.     Left lower leg: No edema.  Neurological:     General: No focal deficit present.     Mental Status: She is alert and oriented to person, place, and time.  Psychiatric:        Mood and Affect: Mood normal.     Results for orders placed or performed in visit on 09/16/22  POCT URINALYSIS DIP (CLINITEK)  Result Value Ref Range   Color, UA yellow yellow   Clarity, UA clear clear   Glucose, UA negative negative  mg/dL   Bilirubin, UA negative negative   Ketones, POC UA trace (5) (A) negative mg/dL   Spec Grav, UA 1.020 1.010 - 1.025   Blood, UA trace-lysed (A) negative   pH, UA 5.5 5.0 - 8.0   POC PROTEIN,UA negative negative, trace   Urobilinogen, UA 0.2 0.2 or 1.0 E.U./dL   Nitrite, UA Negative Negative   Leukocytes, UA Small (1+) (A) Negative        Assessment & Plan:  Marland KitchenMarland KitchenSymiah was seen today for urinary tract infection.  Diagnoses and all orders for this visit:  Urgency of urination -     POCT URINALYSIS DIP (CLINITEK) -     Urine Culture -     phenazopyridine (PYRIDIUM) 200 MG tablet; Take 1 tablet (200 mg total) by mouth 3 (three) times daily for 2 days.  Abnormal urine odor -     POCT URINALYSIS DIP (CLINITEK) -     Urine Culture -     phenazopyridine (PYRIDIUM) 200 MG tablet; Take 1 tablet (200 mg total) by mouth 3 (three) times daily for 2  days.  Vaginal odor -     POCT URINALYSIS DIP (CLINITEK) -     Urine Culture -     phenazopyridine (PYRIDIUM) 200 MG tablet; Take 1 tablet (200 mg total) by mouth 3 (three) times daily for 2 days.   UA positive for leukocytes/trace blood/ketones Will culture Wet prep ordered No red flag UTI symptoms today or abnormal PE Start pyridium Discussed symptomatic UTI care Follow up as needed or if symptoms change or worsen Will add abx if culture shows bacteria     Iran Planas, PA-C

## 2022-09-16 NOTE — Telephone Encounter (Signed)
Scheduled for today at 1:50- tvt

## 2022-09-16 NOTE — Patient Instructions (Signed)
Urinary Tract Infection, Adult  A urinary tract infection (UTI) is an infection of any part of the urinary tract. The urinary tract includes the kidneys, ureters, bladder, and urethra. These organs make, store, and get rid of urine in the body. An upper UTI affects the ureters and kidneys. A lower UTI affects the bladder and urethra. What are the causes? Most urinary tract infections are caused by bacteria in your genital area around your urethra, where urine leaves your body. These bacteria grow and cause inflammation of your urinary tract. What increases the risk? You are more likely to develop this condition if: You have a urinary catheter that stays in place. You are not able to control when you urinate or have a bowel movement (incontinence). You are female and you: Use a spermicide or diaphragm for birth control. Have low estrogen levels. Are pregnant. You have certain genes that increase your risk. You are sexually active. You take antibiotic medicines. You have a condition that causes your flow of urine to slow down, such as: An enlarged prostate, if you are female. Blockage in your urethra. A kidney stone. A nerve condition that affects your bladder control (neurogenic bladder). Not getting enough to drink, or not urinating often. You have certain medical conditions, such as: Diabetes. A weak disease-fighting system (immunesystem). Sickle cell disease. Gout. Spinal cord injury. What are the signs or symptoms? Symptoms of this condition include: Needing to urinate right away (urgency). Frequent urination. This may include small amounts of urine each time you urinate. Pain or burning with urination. Blood in the urine. Urine that smells bad or unusual. Trouble urinating. Cloudy urine. Vaginal discharge, if you are female. Pain in the abdomen or the lower back. You may also have: Vomiting or a decreased appetite. Confusion. Irritability or tiredness. A fever or  chills. Diarrhea. The first symptom in older adults may be confusion. In some cases, they may not have any symptoms until the infection has worsened. How is this diagnosed? This condition is diagnosed based on your medical history and a physical exam. You may also have other tests, including: Urine tests. Blood tests. Tests for STIs (sexually transmitted infections). If you have had more than one UTI, a cystoscopy or imaging studies may be done to determine the cause of the infections. How is this treated? Treatment for this condition includes: Antibiotic medicine. Over-the-counter medicines to treat discomfort. Drinking enough water to stay hydrated. If you have frequent infections or have other conditions such as a kidney stone, you may need to see a health care provider who specializes in the urinary tract (urologist). In rare cases, urinary tract infections can cause sepsis. Sepsis is a life-threatening condition that occurs when the body responds to an infection. Sepsis is treated in the hospital with IV antibiotics, fluids, and other medicines. Follow these instructions at home:  Medicines Take over-the-counter and prescription medicines only as told by your health care provider. If you were prescribed an antibiotic medicine, take it as told by your health care provider. Do not stop using the antibiotic even if you start to feel better. General instructions Make sure you: Empty your bladder often and completely. Do not hold urine for long periods of time. Empty your bladder after sex. Wipe from front to back after urinating or having a bowel movement if you are female. Use each tissue only one time when you wipe. Drink enough fluid to keep your urine pale yellow. Keep all follow-up visits. This is important. Contact a health   care provider if: Your symptoms do not get better after 1-2 days. Your symptoms go away and then return. Get help right away if: You have severe pain in  your back or your lower abdomen. You have a fever or chills. You have nausea or vomiting. Summary A urinary tract infection (UTI) is an infection of any part of the urinary tract, which includes the kidneys, ureters, bladder, and urethra. Most urinary tract infections are caused by bacteria in your genital area. Treatment for this condition often includes antibiotic medicines. If you were prescribed an antibiotic medicine, take it as told by your health care provider. Do not stop using the antibiotic even if you start to feel better. Keep all follow-up visits. This is important. This information is not intended to replace advice given to you by your health care provider. Make sure you discuss any questions you have with your health care provider. Document Revised: 06/30/2020 Document Reviewed: 06/30/2020 Elsevier Patient Education  2023 Elsevier Inc.  

## 2022-09-17 ENCOUNTER — Other Ambulatory Visit: Payer: Self-pay | Admitting: Physician Assistant

## 2022-09-17 LAB — WET PREP FOR TRICH, YEAST, CLUE
MICRO NUMBER:: 14060893
Specimen Quality: ADEQUATE

## 2022-09-17 MED ORDER — METRONIDAZOLE 500 MG PO TABS: 500.0000 mg | ORAL_TABLET | Freq: Two times a day (BID) | ORAL | 0 refills | Status: AC

## 2022-09-17 NOTE — Progress Notes (Signed)
Your wet prep detected bacterial vaginosis which is an overgrowth of normal bacteria. It can cause symptoms and odor. We treat with oral metronidazole for 7 days. I will send to pharmacy.

## 2022-09-19 LAB — URINE CULTURE
MICRO NUMBER:: 14060845
SPECIMEN QUALITY:: ADEQUATE

## 2022-09-20 ENCOUNTER — Other Ambulatory Visit: Payer: Self-pay | Admitting: Physician Assistant

## 2022-09-20 MED ORDER — NITROFURANTOIN MONOHYD MACRO 100 MG PO CAPS: 100.0000 mg | ORAL_CAPSULE | Freq: Two times a day (BID) | ORAL | 0 refills | Status: DC

## 2022-09-20 NOTE — Progress Notes (Signed)
Pt has e.coli detected and needs antibiotic. Macrobid sent for 5 days. If symptoms still present after treatment we need to get another urine sample.

## 2022-10-11 DIAGNOSIS — Z791 Long term (current) use of non-steroidal anti-inflammatories (NSAID): Secondary | ICD-10-CM | POA: Diagnosis not present

## 2022-10-11 DIAGNOSIS — M8589 Other specified disorders of bone density and structure, multiple sites: Secondary | ICD-10-CM | POA: Diagnosis not present

## 2022-10-11 DIAGNOSIS — M1990 Unspecified osteoarthritis, unspecified site: Secondary | ICD-10-CM | POA: Diagnosis not present

## 2022-10-11 DIAGNOSIS — R030 Elevated blood-pressure reading, without diagnosis of hypertension: Secondary | ICD-10-CM | POA: Diagnosis not present

## 2022-10-21 ENCOUNTER — Encounter: Payer: Self-pay | Admitting: Physician Assistant

## 2022-10-21 ENCOUNTER — Ambulatory Visit (INDEPENDENT_AMBULATORY_CARE_PROVIDER_SITE_OTHER): Payer: Medicare HMO | Admitting: Physician Assistant

## 2022-10-21 VITALS — BP 128/61 | HR 74 | Temp 98.1°F | Ht 70.0 in | Wt 198.0 lb

## 2022-10-21 DIAGNOSIS — B9689 Other specified bacterial agents as the cause of diseases classified elsewhere: Secondary | ICD-10-CM

## 2022-10-21 DIAGNOSIS — H109 Unspecified conjunctivitis: Secondary | ICD-10-CM

## 2022-10-21 MED ORDER — POLYMYXIN B-TRIMETHOPRIM 10000-0.1 UNIT/ML-% OP SOLN: 1.0000 [drp] | Freq: Four times a day (QID) | OPHTHALMIC | 0 refills | Status: DC

## 2022-10-21 NOTE — Progress Notes (Signed)
Acute Office Visit  Subjective:     Patient ID: Donna [name], female    DOB: 13-Nov-1951, 71 y.o.   MRN: [id number]  Chief Complaint  Patient presents with   Eye Problem    HPI Patient is in today for bilateral eye redness, itching, watering for the last 4 days. Denies any eye pain or vision changes.  For the last week she has had URI symptoms that have seemed to get better but her eyes have gotten worse. No fever, chills, body aches, cough. She did have sinus pressure, drainage, ST, PND but seems to be mostly improved. She continues to have some ear pressure and popping. She has been using some OTC eye drops and warm compresses.   .. Active Ambulatory Problems    Diagnosis Date Noted   Primary osteoarthritis of left knee 06/10/2016   Elevated LDL cholesterol level 06/10/2016   Class 1 obesity due to excess calories without serious comorbidity with body mass index (BMI) of 32.0 to 32.9 in adult 07/26/2016   Overweight (BMI 25.0-29.9) 01/27/2017   Bilateral hearing loss 08/23/2018   Post-menopausal 08/23/2018   Osteopenia 09/02/2018   Plantar fasciitis, right 02/19/2019   Aphthous ulcer of mouth 10/11/2020   Proteinuria 10/11/2020   Leukocytes in urine 10/11/2020   Orthostatic hypotension 02/15/2022   Vertigo 02/15/2022   Ganglion cyst of joint of finger of left hand 02/26/2022   Impingement syndrome, shoulder, left 07/02/2022   Resolved Ambulatory Problems    Diagnosis Date Noted   Left knee pain 06/10/2016   Laceration 06/10/2016   Boil, labium 11/22/2016   Tear of right supraspinatus tendon 08/23/2018   Past Medical History:  Diagnosis Date   Allergy    Arthritis    Rosacea    Tachycardia      ROS See HPI.      Objective:    BP 128/61   Pulse 74   Temp 98.1 F (36.7 C) (Oral)   Ht '5\' 10"'$  (1.778 m)   Wt 198 lb (89.8 kg)   SpO2 98%   BMI 28.41 kg/m  BP Readings from Last 3 Encounters:  10/21/22 128/61  09/16/22 (!) 144/75  07/09/21 133/63   Wt  Readings from Last 3 Encounters:  10/21/22 198 lb (89.8 kg)  09/16/22 190 lb (86.2 kg)  02/13/22 191 lb (86.6 kg)      Physical Exam Constitutional:      Appearance: Normal appearance.  HENT:     Head: Normocephalic.     Right Ear: Tympanic membrane, ear canal and external ear normal. There is no impacted cerumen.     Left Ear: Tympanic membrane, ear canal and external ear normal. There is no impacted cerumen.     Nose: Nose normal.     Mouth/Throat:     Mouth: Mucous membranes are moist.     Pharynx: No oropharyngeal exudate.  Eyes:     Extraocular Movements: Extraocular movements intact.     Pupils: Pupils are equal, round, and reactive to light.     Comments: Injected conjunctiva left worse than right with watery discharge and some evidence of thicker green like discharge.   Cardiovascular:     Rate and Rhythm: Normal rate and regular rhythm.  Pulmonary:     Effort: Pulmonary effort is normal.     Breath sounds: Normal breath sounds.  Musculoskeletal:     Cervical back: Normal range of motion and neck supple. No tenderness.  Lymphadenopathy:     Cervical: No cervical  adenopathy.  Neurological:     General: No focal deficit present.     Mental Status: She is alert.  Psychiatric:        Mood and Affect: Mood normal.         Assessment & Plan:  Donna KitchenMarland KitchenTenishia was seen today for eye problem.  Diagnoses and all orders for this visit:  Bacterial conjunctivitis of both eyes -     trimethoprim-polymyxin b (POLYTRIM) ophthalmic solution; Place 1 drop into both eyes every 6 (six) hours. For 7 days.   Viral vs bacterial ongoing for 1 week will treat with polytrim for 7 days Reassured no signs of sinusitis or otitis.  Continue warm compresses Throw out make up used Follow up as needed or if symptoms not improving in next 2 days before Thanksgiving.   Iran Planas, PA-C

## 2022-10-21 NOTE — Patient Instructions (Signed)
Bacterial Conjunctivitis, Adult Bacterial conjunctivitis is an infection of your conjunctiva. This is the clear membrane that covers the white part of your eye and the inner part of your eyelid. This infection can make your eye: Red or pink. Itchy or irritated. This condition spreads easily from person to person (is contagious) and from one eye to the other eye. What are the causes? This condition is caused by germs (bacteria). You may get the infection if you come into close contact with: A person who has the infection. Items that have germs on them (are contaminated), such as face towels, contact lens solution, or eye makeup. What increases the risk? You are more likely to get this condition if: You have contact with people who have the infection. You wear contact lenses. You have a sinus infection. You have had a recent eye injury or surgery. You have a weak body defense system (immune system). You have dry eyes. What are the signs or symptoms?  Thick, yellowish discharge from the eye. Tearing or watery eyes. Itchy eyes. Burning feeling in your eyes. Eye redness. Swollen eyelids. Blurred vision. How is this treated?  Antibiotic eye drops or ointment. Antibiotic medicine taken by mouth. This is used for infections that do not get better with drops or ointment or that last more than 10 days. Cool, wet cloths placed on the eyes. Artificial tears used 2-6 times a day. Follow these instructions at home: Medicines Take or apply your antibiotic medicine as told by your doctor. Do not stop using it even if you start to feel better. Take or apply over-the-counter and prescription medicines only as told by your doctor. Do not touch your eyelid with the eye-drop bottle or the ointment tube. Managing discomfort Wipe any fluid from your eye with a warm, wet washcloth or a cotton ball. Place a clean, cool, wet cloth on your eye. Do this for 10-20 minutes, 3-4 times a day. General  instructions Do not wear contacts until the infection is gone. Wear glasses until your doctor says it is okay to wear contacts again. Do not wear eye makeup until the infection is gone. Throw away old eye makeup. Change or wash your pillowcase every day. Do not share towels or washcloths. Wash your hands often with soap and water for at least 20 seconds and especially before touching your face or eyes. Use paper towels to dry your hands. Do not touch or rub your eyes. Do not drive or use heavy machinery if your vision is blurred. Contact a doctor if: You have a fever. You do not get better after 10 days. Get help right away if: You have a fever and your symptoms get worse all of a sudden. You have very bad pain when you move your eye. Your face: Hurts. Is red. Is swollen. You have sudden loss of vision. Summary Bacterial conjunctivitis is an infection of your conjunctiva. This infection spreads easily from person to person. Wash your hands often with soap and water for at least 20 seconds and especially before touching your face or eyes. Use paper towels to dry your hands. Take or apply your antibiotic medicine as told by your doctor. Contact a doctor if you have a fever or you do not get better after 10 days. This information is not intended to replace advice given to you by your health care provider. Make sure you discuss any questions you have with your health care provider. Document Revised: 02/28/2021 Document Reviewed: 02/28/2021 Elsevier Patient Education    2023 Elsevier Inc.  

## 2023-01-24 ENCOUNTER — Telehealth: Payer: Self-pay | Admitting: Neurology

## 2023-01-24 NOTE — Telephone Encounter (Signed)
Patient called and LVM stating she has had some popping her neck when she turns it, then it is sore after. This has been going on for months and she is wondering if she should see [name] or be seen by a specialist. Please advise.

## 2023-01-24 NOTE — Telephone Encounter (Signed)
Spoke with patient, she is okay with seeing Dr. Darene Lamer.   Can we call her with an appt to see Dr. Darene Lamer for neck pain/popping?

## 2023-01-27 ENCOUNTER — Ambulatory Visit (INDEPENDENT_AMBULATORY_CARE_PROVIDER_SITE_OTHER): Payer: Medicare HMO | Admitting: Sports Medicine

## 2023-01-27 ENCOUNTER — Ambulatory Visit (INDEPENDENT_AMBULATORY_CARE_PROVIDER_SITE_OTHER): Payer: Medicare HMO

## 2023-01-27 DIAGNOSIS — M2480 Other specific joint derangements of unspecified joint, not elsewhere classified: Secondary | ICD-10-CM

## 2023-01-27 DIAGNOSIS — M47812 Spondylosis without myelopathy or radiculopathy, cervical region: Secondary | ICD-10-CM

## 2023-01-27 DIAGNOSIS — M7918 Myalgia, other site: Secondary | ICD-10-CM | POA: Diagnosis not present

## 2023-01-27 NOTE — Assessment & Plan Note (Signed)
This is a very pleasant 72 year old female, she has noted increasing crepitus when taking her neck through the range of motion, minimal pain left trapezius. Nothing overtly radicular to the hand or fingertips, no periscapular pain. No progressive weakness, no trauma, no constitutional symptoms. Good motion on exam, no discrete areas of tenderness to palpation. Suspect early and mild cervical spondylosis, patient would like a nonpharmacologic approach, adding x-rays and home physical therapy, return to see me if no better in 6 weeks and we can consider Tylenol +/- Celebrex.

## 2023-01-27 NOTE — Progress Notes (Signed)
    Procedures performed today:    None.  Independent interpretation of notes and tests performed by another provider:   None.  Brief History, Exam, Impression, and Recommendations:    Cervical spondylosis This is a very pleasant 72 year old female, she has noted increasing crepitus when taking her neck through the range of motion, minimal pain left trapezius. Nothing overtly radicular to the hand or fingertips, no periscapular pain. No progressive weakness, no trauma, no constitutional symptoms. Good motion on exam, no discrete areas of tenderness to palpation. Suspect early and mild cervical spondylosis, patient would like a nonpharmacologic approach, adding x-rays and home physical therapy, return to see me if no better in 6 weeks and we can consider Tylenol +/- Celebrex.    ____________________________________________ Gwen Her. Dianah Field, M.D., ABFM., CAQSM., AME. Primary Care and Sports Medicine [facility]  Adjunct Professor of [employer]  Risk manager

## 2023-02-03 DIAGNOSIS — Z129 Encounter for screening for malignant neoplasm, site unspecified: Secondary | ICD-10-CM | POA: Diagnosis not present

## 2023-02-03 DIAGNOSIS — L821 Other seborrheic keratosis: Secondary | ICD-10-CM | POA: Diagnosis not present

## 2023-02-03 DIAGNOSIS — L814 Other melanin hyperpigmentation: Secondary | ICD-10-CM | POA: Diagnosis not present

## 2023-02-03 DIAGNOSIS — L820 Inflamed seborrheic keratosis: Secondary | ICD-10-CM | POA: Diagnosis not present

## 2023-02-03 DIAGNOSIS — L570 Actinic keratosis: Secondary | ICD-10-CM | POA: Diagnosis not present

## 2023-02-26 ENCOUNTER — Ambulatory Visit (INDEPENDENT_AMBULATORY_CARE_PROVIDER_SITE_OTHER): Payer: Medicare HMO

## 2023-02-26 DIAGNOSIS — Z1231 Encounter for screening mammogram for malignant neoplasm of breast: Secondary | ICD-10-CM | POA: Diagnosis not present

## 2023-02-26 DIAGNOSIS — Z0000 Encounter for general adult medical examination without abnormal findings: Secondary | ICD-10-CM

## 2023-02-27 NOTE — Progress Notes (Signed)
Normal mammogram. Follow up in 1 year.

## 2023-03-17 ENCOUNTER — Encounter: Payer: Self-pay | Admitting: Sports Medicine

## 2023-03-17 ENCOUNTER — Ambulatory Visit (INDEPENDENT_AMBULATORY_CARE_PROVIDER_SITE_OTHER): Payer: Medicare HMO | Admitting: Sports Medicine

## 2023-03-17 DIAGNOSIS — Z7184 Encounter for health counseling related to travel: Secondary | ICD-10-CM

## 2023-03-17 DIAGNOSIS — M47812 Spondylosis without myelopathy or radiculopathy, cervical region: Secondary | ICD-10-CM

## 2023-03-17 HISTORY — DX: Encounter for health counseling related to travel: Z71.84

## 2023-03-17 MED ORDER — MEFLOQUINE HCL 250 MG PO TABS: 250.0000 mg | ORAL_TABLET | ORAL | 0 refills | Status: DC

## 2023-03-17 MED ORDER — TYPHOID VACCINE PO CPDR: 1.0000 | DELAYED_RELEASE_CAPSULE | ORAL | 0 refills | Status: DC

## 2023-03-17 MED ORDER — VAXCHORA PO SUSR: 100.0000 mL | Freq: Once | ORAL | 0 refills | Status: AC

## 2023-03-17 MED ORDER — YELLOW FEVER VACCINE ~~LOC~~ INJ: 0.5000 mL | INJECTION | Freq: Once | SUBCUTANEOUS | 0 refills | Status: DC

## 2023-03-17 MED ORDER — CIPROFLOXACIN HCL 500 MG PO TABS: 500.0000 mg | ORAL_TABLET | Freq: Two times a day (BID) | ORAL | 0 refills | Status: DC

## 2023-03-17 NOTE — Patient Instructions (Signed)

## 2023-03-17 NOTE — Assessment & Plan Note (Signed)
Donna [name] is going to be traveling to [place], [place], [place], [place]. She is due for some preventative treatment including vaccination for yellow fever, traveler's diarrhea, mefloquine for malaria prophylaxis, Cholera.

## 2023-03-17 NOTE — Assessment & Plan Note (Signed)
Symptoms under adequate control, x-rays did show multilevel spondylosis, continue home conditioning, return as needed for this.

## 2023-03-17 NOTE — Progress Notes (Signed)
    Procedures performed today:    None.  Independent interpretation of notes and tests performed by another provider:   None.  Brief History, Exam, Impression, and Recommendations:    Cervical spondylosis Symptoms under adequate control, x-rays did show multilevel spondylosis, continue home conditioning, return as needed for this.  Travel advice encounter Donna [name] is going to be traveling to [place], [place], [place], [place]. She is due for some preventative treatment including vaccination for yellow fever, traveler's diarrhea, mefloquine for malaria prophylaxis, Cholera.  I spent 30 minutes of total time managing this patient today, this includes chart review, face to face, and non-face to face time.   ____________________________________________ Ihor Austin. Benjamin Stain, M.D., ABFM., CAQSM., AME. Primary Care and Sports Medicine [facility]  Adjunct Professor of Family Medicine  [employer]  Restaurant manager, fast food

## 2023-03-18 ENCOUNTER — Encounter: Payer: Self-pay | Admitting: Physician Assistant

## 2023-03-21 ENCOUNTER — Encounter: Payer: Self-pay | Admitting: Physician Assistant

## 2023-03-21 DIAGNOSIS — Z7184 Encounter for health counseling related to travel: Secondary | ICD-10-CM

## 2023-03-21 MED ORDER — TYPHOID VACCINE PO CPDR: 1.0000 | DELAYED_RELEASE_CAPSULE | ORAL | 0 refills | Status: DC

## 2023-03-21 MED ORDER — VAXCHORA PO SUSR: 100.0000 mL | Freq: Once | ORAL | 0 refills | Status: AC

## 2023-03-21 NOTE — Addendum Note (Signed)
Addended by: Jomarie Longs on: 03/21/2023 01:44 PM   Modules accepted: Orders

## 2023-03-21 NOTE — Telephone Encounter (Signed)
Received incoming call from patient requesting vaccine records. Patient is asking about getting Typhoid and Cholera vaccines and when did she get them last. Patient will be leaving on 04/10/23 and needs to have some of the vaccines no later than 04/01/2023. Please advise.  

## 2023-03-21 NOTE — Telephone Encounter (Signed)
Received incoming call from patient requesting vaccine records. Patient is asking about getting Typhoid and Cholera vaccines and when did she get them last. Patient will be leaving on 04/10/23 and needs to have some of the vaccines no later than 04/01/2023. Please advise.

## 2023-08-11 ENCOUNTER — Ambulatory Visit (INDEPENDENT_AMBULATORY_CARE_PROVIDER_SITE_OTHER): Payer: Medicare HMO | Admitting: Physician Assistant

## 2023-08-11 ENCOUNTER — Encounter: Payer: Self-pay | Admitting: Physician Assistant

## 2023-08-11 VITALS — BP 125/72 | HR 75 | Ht 70.0 in | Wt 211.0 lb

## 2023-08-11 DIAGNOSIS — Z131 Encounter for screening for diabetes mellitus: Secondary | ICD-10-CM

## 2023-08-11 DIAGNOSIS — R635 Abnormal weight gain: Secondary | ICD-10-CM

## 2023-08-11 DIAGNOSIS — Z13220 Encounter for screening for lipoid disorders: Secondary | ICD-10-CM

## 2023-08-11 DIAGNOSIS — Z6830 Body mass index (BMI) 30.0-30.9, adult: Secondary | ICD-10-CM

## 2023-08-11 DIAGNOSIS — E6609 Other obesity due to excess calories: Secondary | ICD-10-CM

## 2023-08-11 MED ORDER — PHENTERMINE HCL 37.5 MG PO TABS
37.5000 mg | ORAL_TABLET | Freq: Every day | ORAL | 0 refills | Status: DC
Start: 2023-08-11 — End: 2024-09-28

## 2023-08-11 NOTE — Patient Instructions (Addendum)
Contrave Wellbutrin Topamax  Wegovy Zepbound

## 2023-08-11 NOTE — Progress Notes (Unsigned)
Established Patient Office Visit  Subjective   Patient ID: Donna [name], female    DOB: 14-Mar-1951  Age: 72 y.o. MRN: [id number]  Chief Complaint  Patient presents with   Medical Management of Chronic Issues    Wgt management    HPI Pt is a 72 yo obese female who presents to the clinic to discuss weight. She has gained 12lbs in the last year. She is active but not exercising regularly. She has lost weight over the years with noom, weight watchers, diets etc. She has tried phentermine in the past and help jump start weight loss. She wants help. She feels like her appetite is out of control.    .. Active Ambulatory Problems    Diagnosis Date Noted   Primary osteoarthritis of left knee 06/10/2016   Elevated LDL cholesterol level 06/10/2016   Class 1 obesity due to excess calories without serious comorbidity with body mass index (BMI) of 30.0 to 30.9 in adult 07/26/2016   Overweight (BMI 25.0-29.9) 01/27/2017   Bilateral hearing loss 08/23/2018   Post-menopausal 08/23/2018   Osteopenia 09/02/2018   Plantar fasciitis, right 02/19/2019   Aphthous ulcer of mouth 10/11/2020   Proteinuria 10/11/2020   Leukocytes in urine 10/11/2020   Orthostatic hypotension 02/15/2022   Vertigo 02/15/2022   Ganglion cyst of joint of finger of left hand 02/26/2022   Impingement syndrome, shoulder, left 07/02/2022   Cervical spondylosis 01/27/2023   Travel advice encounter 03/17/2023   Resolved Ambulatory Problems    Diagnosis Date Noted   Left knee pain 06/10/2016   Laceration 06/10/2016   Boil, labium 11/22/2016   Tear of right supraspinatus tendon 08/23/2018   Past Medical History:  Diagnosis Date   Allergy    Arthritis    Rosacea    Tachycardia      ROS See HPI.    Objective:     BP 125/72   Pulse 75   Ht 5\' 10"  (1.778 m)   Wt 211 lb (95.7 kg)   SpO2 99%   BMI 30.28 kg/m  BP Readings from Last 3 Encounters:  08/11/23 125/72  10/21/22 128/61  09/16/22 (!) 144/75   Wt  Readings from Last 3 Encounters:  08/11/23 211 lb (95.7 kg)  10/21/22 198 lb (89.8 kg)  09/16/22 190 lb (86.2 kg)      Physical Exam Constitutional:      Appearance: Normal appearance.  HENT:     Head: Normocephalic.  Cardiovascular:     Rate and Rhythm: Normal rate and regular rhythm.     Heart sounds: Normal heart sounds.  Pulmonary:     Effort: Pulmonary effort is normal.     Breath sounds: Normal breath sounds.  Neurological:     General: No focal deficit present.     Mental Status: She is alert and oriented to person, place, and time.  Psychiatric:        Mood and Affect: Mood normal.      The 10-year ASCVD risk score (Arnett DK, et al., 2019) is: 12.2%    Assessment & Plan:  Marland KitchenMarland KitchenKelliann was seen today for medical management of chronic issues.  Diagnoses and all orders for this visit:  Class 1 obesity due to excess calories without serious comorbidity with body mass index (BMI) of 30.0 to 30.9 in adult -     TSH + free T4 -     CMP14+EGFR -     Lipid panel -     CBC w/Diff/Platelet -  Cortisol -     phentermine (ADIPEX-P) 37.5 MG tablet; Take 1 tablet (37.5 mg total) by mouth daily before breakfast.  Screening for diabetes mellitus -     CMP14+EGFR  Screening for lipid disorders -     Lipid panel  Abnormal weight gain -     TSH + free T4 -     CMP14+EGFR -     Lipid panel -     CBC w/Diff/Platelet -     Cortisol -     phentermine (ADIPEX-P) 37.5 MG tablet; Take 1 tablet (37.5 mg total) by mouth daily before breakfast.   Need screening labs, ordered today.  BMI 30 with 12lb weight gain in last year  .Marland KitchenDiscussed low carb diet with 1500 calories and 80g of protein.  Exercising at least 150 minutes a week.  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  Discussed medication options such as wegovy/zepbound/wellbutrin/contrave Would like to start with phentermine that she has tried before and helped jump start her weight loss Phentermine given, discussed  SE.  BP much better on 2nd recheck.    Spent 35 minutes with patient in chart review, discussing weight loss, medications and plans.   Return in about 3 months (around 11/10/2023), or if symptoms worsen or fail to improve.    Tandy Gaw, PA-C

## 2023-08-12 ENCOUNTER — Encounter: Payer: Self-pay | Admitting: Physician Assistant

## 2023-08-13 DIAGNOSIS — Z13220 Encounter for screening for lipoid disorders: Secondary | ICD-10-CM | POA: Diagnosis not present

## 2023-08-13 DIAGNOSIS — Z131 Encounter for screening for diabetes mellitus: Secondary | ICD-10-CM | POA: Diagnosis not present

## 2023-08-13 DIAGNOSIS — Z6830 Body mass index (BMI) 30.0-30.9, adult: Secondary | ICD-10-CM | POA: Diagnosis not present

## 2023-08-13 DIAGNOSIS — R635 Abnormal weight gain: Secondary | ICD-10-CM | POA: Diagnosis not present

## 2023-08-13 DIAGNOSIS — E6609 Other obesity due to excess calories: Secondary | ICD-10-CM | POA: Diagnosis not present

## 2023-08-14 ENCOUNTER — Encounter: Payer: Self-pay | Admitting: Physician Assistant

## 2023-08-14 DIAGNOSIS — I951 Orthostatic hypotension: Secondary | ICD-10-CM | POA: Diagnosis not present

## 2023-08-14 DIAGNOSIS — M1990 Unspecified osteoarthritis, unspecified site: Secondary | ICD-10-CM | POA: Diagnosis not present

## 2023-08-14 DIAGNOSIS — I499 Cardiac arrhythmia, unspecified: Secondary | ICD-10-CM | POA: Diagnosis not present

## 2023-08-14 DIAGNOSIS — M249 Joint derangement, unspecified: Secondary | ICD-10-CM | POA: Diagnosis not present

## 2023-08-14 DIAGNOSIS — M8580 Other specified disorders of bone density and structure, unspecified site: Secondary | ICD-10-CM | POA: Diagnosis not present

## 2023-08-14 DIAGNOSIS — Z809 Family history of malignant neoplasm, unspecified: Secondary | ICD-10-CM | POA: Diagnosis not present

## 2023-08-14 DIAGNOSIS — Z791 Long term (current) use of non-steroidal anti-inflammatories (NSAID): Secondary | ICD-10-CM | POA: Diagnosis not present

## 2023-08-14 DIAGNOSIS — Z91013 Allergy to seafood: Secondary | ICD-10-CM | POA: Diagnosis not present

## 2023-08-14 DIAGNOSIS — R32 Unspecified urinary incontinence: Secondary | ICD-10-CM | POA: Diagnosis not present

## 2023-08-14 LAB — CBC WITH DIFFERENTIAL/PLATELET
Basophils Absolute: 0.1 10*3/uL (ref 0.0–0.2)
Basos: 1 %
EOS (ABSOLUTE): 0.1 10*3/uL (ref 0.0–0.4)
Eos: 2 %
Hematocrit: 44.5 % (ref 34.0–46.6)
Hemoglobin: 14.2 g/dL (ref 11.1–15.9)
Immature Grans (Abs): 0 10*3/uL (ref 0.0–0.1)
Immature Granulocytes: 0 %
Lymphocytes Absolute: 2.4 10*3/uL (ref 0.7–3.1)
Lymphs: 40 %
MCH: 30.1 pg (ref 26.6–33.0)
MCHC: 31.9 g/dL (ref 31.5–35.7)
MCV: 94 fL (ref 79–97)
Monocytes Absolute: 0.4 10*3/uL (ref 0.1–0.9)
Monocytes: 7 %
Neutrophils Absolute: 2.9 10*3/uL (ref 1.4–7.0)
Neutrophils: 50 %
Platelets: 237 10*3/uL (ref 150–450)
RBC: 4.72 x10E6/uL (ref 3.77–5.28)
RDW: 13.6 % (ref 11.7–15.4)
WBC: 5.8 10*3/uL (ref 3.4–10.8)

## 2023-08-14 LAB — TSH+FREE T4
Free T4: 1.26 ng/dL (ref 0.82–1.77)
TSH: 1.53 u[IU]/mL (ref 0.450–4.500)

## 2023-08-14 LAB — CMP14+EGFR
ALT: 16 IU/L (ref 0–32)
AST: 21 IU/L (ref 0–40)
Albumin: 3.8 g/dL (ref 3.8–4.8)
Alkaline Phosphatase: 111 IU/L (ref 44–121)
BUN/Creatinine Ratio: 14 (ref 12–28)
BUN: 14 mg/dL (ref 8–27)
Bilirubin Total: 0.4 mg/dL (ref 0.0–1.2)
CO2: 22 mmol/L (ref 20–29)
Calcium: 9 mg/dL (ref 8.7–10.3)
Chloride: 105 mmol/L (ref 96–106)
Creatinine, Ser: 0.97 mg/dL (ref 0.57–1.00)
Globulin, Total: 2.6 g/dL (ref 1.5–4.5)
Glucose: 89 mg/dL (ref 70–99)
Potassium: 4.5 mmol/L (ref 3.5–5.2)
Sodium: 142 mmol/L (ref 134–144)
Total Protein: 6.4 g/dL (ref 6.0–8.5)
eGFR: 62 mL/min/{1.73_m2} (ref >59)

## 2023-08-14 LAB — LIPID PANEL
Chol/HDL Ratio: 2.7 ratio (ref 0.0–4.4)
Cholesterol, Total: 212 mg/dL — ABNORMAL HIGH (ref 100–199)
HDL: 78 mg/dL (ref >39)
LDL Chol Calc (NIH): 111 mg/dL — ABNORMAL HIGH (ref 0–99)
Triglycerides: 132 mg/dL (ref 0–149)
VLDL Cholesterol Cal: 23 mg/dL (ref 5–40)

## 2023-08-14 LAB — CORTISOL: Cortisol: 17.1 ug/dL (ref 6.2–19.4)

## 2023-08-14 NOTE — Progress Notes (Signed)
Donna [name],   Cortisol normal.  Kidney and liver look great.  HDL, good cholesterol, looks great.  LDL, bad cholesterol, up a little from optimal but with your risk factors your CV 10 year risk is still elevated at 12.3 percent and a statin(cholesterol lowering medication) is indicated. Are you ok with starting?   Marland KitchenMarland KitchenThe 10-year ASCVD risk score (Arnett DK, et al., 2019) is: 12.3%   Values used to calculate the score:     Age: 72 years     Sex: Female     Is Non-Hispanic African American: No     Diabetic: No     Tobacco smoker: No     Systolic Blood Pressure: 125 mmHg     Is BP treated: No     HDL Cholesterol: 78 mg/dL     Total Cholesterol: 212 mg/dL

## 2023-08-20 MED ORDER — ATORVASTATIN CALCIUM 40 MG PO TABS
40.0000 mg | ORAL_TABLET | Freq: Every day | ORAL | 3 refills | Status: DC
Start: 2023-08-20 — End: 2024-09-28

## 2023-09-30 DIAGNOSIS — L820 Inflamed seborrheic keratosis: Secondary | ICD-10-CM | POA: Diagnosis not present

## 2023-09-30 DIAGNOSIS — D485 Neoplasm of uncertain behavior of skin: Secondary | ICD-10-CM | POA: Diagnosis not present

## 2024-02-09 DIAGNOSIS — D485 Neoplasm of uncertain behavior of skin: Secondary | ICD-10-CM | POA: Diagnosis not present

## 2024-02-09 DIAGNOSIS — L820 Inflamed seborrheic keratosis: Secondary | ICD-10-CM | POA: Diagnosis not present

## 2024-02-09 DIAGNOSIS — C44622 Squamous cell carcinoma of skin of right upper limb, including shoulder: Secondary | ICD-10-CM | POA: Diagnosis not present

## 2024-02-09 DIAGNOSIS — L821 Other seborrheic keratosis: Secondary | ICD-10-CM | POA: Diagnosis not present

## 2024-02-09 DIAGNOSIS — Z129 Encounter for screening for malignant neoplasm, site unspecified: Secondary | ICD-10-CM | POA: Diagnosis not present

## 2024-02-09 DIAGNOSIS — L718 Other rosacea: Secondary | ICD-10-CM | POA: Diagnosis not present

## 2024-02-09 DIAGNOSIS — D1801 Hemangioma of skin and subcutaneous tissue: Secondary | ICD-10-CM | POA: Diagnosis not present

## 2024-02-25 DIAGNOSIS — C44622 Squamous cell carcinoma of skin of right upper limb, including shoulder: Secondary | ICD-10-CM | POA: Diagnosis not present

## 2024-03-02 ENCOUNTER — Telehealth: Payer: Self-pay | Admitting: Physician Assistant

## 2024-03-02 DIAGNOSIS — Z1231 Encounter for screening mammogram for malignant neoplasm of breast: Secondary | ICD-10-CM

## 2024-03-02 DIAGNOSIS — H9193 Unspecified hearing loss, bilateral: Secondary | ICD-10-CM

## 2024-03-02 NOTE — Telephone Encounter (Signed)
 Copied from CRM [id number]. Topic: General - Other >> Mar 02, 2024 10:08 AM Donna [name] wrote: Reason for CRM: patient states she is due for a Mammogram and  would like to know if she needs an appt with the office first in order to get her hearing tested.   Called patient she is asking to get an order for Mammogram and an Hearing test referral Please advise

## 2024-03-02 NOTE — Addendum Note (Signed)
 Addended by: Jomarie Longs on: 03/02/2024 04:10 PM   Modules accepted: Orders

## 2024-03-02 NOTE — Telephone Encounter (Signed)
 Thank you :)

## 2024-03-04 DIAGNOSIS — Z0100 Encounter for examination of eyes and vision without abnormal findings: Secondary | ICD-10-CM | POA: Diagnosis not present

## 2024-03-10 DIAGNOSIS — L905 Scar conditions and fibrosis of skin: Secondary | ICD-10-CM | POA: Diagnosis not present

## 2024-03-29 DIAGNOSIS — L923 Foreign body granuloma of the skin and subcutaneous tissue: Secondary | ICD-10-CM | POA: Diagnosis not present

## 2024-04-07 ENCOUNTER — Ambulatory Visit

## 2024-04-07 DIAGNOSIS — Z1231 Encounter for screening mammogram for malignant neoplasm of breast: Secondary | ICD-10-CM | POA: Diagnosis not present

## 2024-04-09 ENCOUNTER — Encounter: Payer: Self-pay | Admitting: Physician Assistant

## 2024-04-09 NOTE — Progress Notes (Signed)
 Normal mammogram. Follow up in 1 year.

## 2024-05-12 DIAGNOSIS — H25813 Combined forms of age-related cataract, bilateral: Secondary | ICD-10-CM | POA: Diagnosis not present

## 2024-05-20 DIAGNOSIS — Z6832 Body mass index (BMI) 32.0-32.9, adult: Secondary | ICD-10-CM | POA: Diagnosis not present

## 2024-05-20 DIAGNOSIS — H4322 Crystalline deposits in vitreous body, left eye: Secondary | ICD-10-CM | POA: Diagnosis not present

## 2024-05-20 DIAGNOSIS — E669 Obesity, unspecified: Secondary | ICD-10-CM | POA: Diagnosis not present

## 2024-05-20 DIAGNOSIS — H353131 Nonexudative age-related macular degeneration, bilateral, early dry stage: Secondary | ICD-10-CM | POA: Diagnosis not present

## 2024-05-20 DIAGNOSIS — H02834 Dermatochalasis of left upper eyelid: Secondary | ICD-10-CM | POA: Diagnosis not present

## 2024-05-20 DIAGNOSIS — H02831 Dermatochalasis of right upper eyelid: Secondary | ICD-10-CM | POA: Diagnosis not present

## 2024-05-20 DIAGNOSIS — H40013 Open angle with borderline findings, low risk, bilateral: Secondary | ICD-10-CM | POA: Diagnosis not present

## 2024-05-20 DIAGNOSIS — H52222 Regular astigmatism, left eye: Secondary | ICD-10-CM | POA: Diagnosis not present

## 2024-05-20 DIAGNOSIS — H25811 Combined forms of age-related cataract, right eye: Secondary | ICD-10-CM | POA: Diagnosis not present

## 2024-06-17 DIAGNOSIS — Z961 Presence of intraocular lens: Secondary | ICD-10-CM | POA: Diagnosis not present

## 2024-06-17 DIAGNOSIS — H353131 Nonexudative age-related macular degeneration, bilateral, early dry stage: Secondary | ICD-10-CM | POA: Diagnosis not present

## 2024-06-17 DIAGNOSIS — H25813 Combined forms of age-related cataract, bilateral: Secondary | ICD-10-CM | POA: Diagnosis not present

## 2024-06-17 DIAGNOSIS — H40013 Open angle with borderline findings, low risk, bilateral: Secondary | ICD-10-CM | POA: Diagnosis not present

## 2024-06-17 DIAGNOSIS — Z9841 Cataract extraction status, right eye: Secondary | ICD-10-CM | POA: Diagnosis not present

## 2024-06-17 DIAGNOSIS — H4322 Crystalline deposits in vitreous body, left eye: Secondary | ICD-10-CM | POA: Diagnosis not present

## 2024-06-17 DIAGNOSIS — E669 Obesity, unspecified: Secondary | ICD-10-CM | POA: Diagnosis not present

## 2024-06-17 DIAGNOSIS — Z6832 Body mass index (BMI) 32.0-32.9, adult: Secondary | ICD-10-CM | POA: Diagnosis not present

## 2024-06-17 DIAGNOSIS — H25812 Combined forms of age-related cataract, left eye: Secondary | ICD-10-CM | POA: Diagnosis not present

## 2024-06-17 DIAGNOSIS — H5703 Miosis: Secondary | ICD-10-CM | POA: Diagnosis not present

## 2024-06-17 DIAGNOSIS — H02834 Dermatochalasis of left upper eyelid: Secondary | ICD-10-CM | POA: Diagnosis not present

## 2024-06-17 DIAGNOSIS — H52222 Regular astigmatism, left eye: Secondary | ICD-10-CM | POA: Diagnosis not present

## 2024-06-17 DIAGNOSIS — H02831 Dermatochalasis of right upper eyelid: Secondary | ICD-10-CM | POA: Diagnosis not present

## 2024-06-22 ENCOUNTER — Telehealth: Payer: Self-pay | Admitting: Physician Assistant

## 2024-06-22 NOTE — Telephone Encounter (Signed)
 Copied from CRM #[id number]. Topic: Clinical - Medication Refill >> Jun 22, 2024  3:31 PM Carrielelia G wrote: Patient is traveling  to [place]  and would like this medication...   (declined to schedule a visit, said provider has prescribed this before)  Medication: mefloquine  (LARIAM ) 250 MG tablet   Has the patient contacted their pharmacy? No (Agent: If no, request that the patient contact the pharmacy for the refill. If patient does not wish to contact the pharmacy document the reason why and proceed with request.) (Agent: If yes, when and what did the pharmacy advise?)  This is the patient's preferred pharmacy:  [facility] [address], [place] - [address] [place] [address] Phone: [phone] Fax: [phone]  Is this the correct pharmacy for this prescription? Yes If no, delete pharmacy and type the correct one.    Is the patient out of the medication? Yes  Has the patient been seen for an appointment in the last year OR does the patient have an upcoming appointment? Yes  Can we respond through MyChart? Yes  Agent: Please be advised that Rx refills may take up to 3 business days. We ask that you follow-up with your pharmacy.

## 2024-06-23 MED ORDER — MEFLOQUINE HCL 250 MG PO TABS
250.0000 mg | ORAL_TABLET | ORAL | 0 refills | Status: DC
Start: 2024-06-23 — End: 2024-09-28

## 2024-06-23 NOTE — Telephone Encounter (Signed)
 Patient advised.

## 2024-06-23 NOTE — Telephone Encounter (Signed)
Let patient know it was sent to pharmacy

## 2024-07-23 ENCOUNTER — Ambulatory Visit: Payer: Self-pay

## 2024-07-23 DIAGNOSIS — Z0100 Encounter for examination of eyes and vision without abnormal findings: Secondary | ICD-10-CM | POA: Diagnosis not present

## 2024-07-23 NOTE — Telephone Encounter (Signed)
 FYI Only or Action Required?: FYI only for provider.  Patient was last seen in primary care on 08/11/2023 by Antoniette Vermell CROME, PA-C.  Called Nurse Triage reporting Numbness.  Symptoms began several months ago.  Interventions attempted: Nothing.  Symptoms are: gradually worsening.  Triage Disposition: See PCP When Office is Open (Within 3 Days)  Patient/caregiver understands and will follow disposition?: No, refuses disposition   Copied from CRM #[id number]. Topic: Clinical - Red Word Triage >> Jul 23, 2024  2:33 PM Graeme ORN wrote: Red Word that prompted transfer to Nurse Triage: numbness pain feet, hard to sleep, possible neuropathy. Reason for Disposition  [1] Weakness or numbness in foot or toes AND [2] present > 2 weeks  Answer Assessment - Initial Assessment Questions Feet feel hot with numbness. Pt states it has been hard to sleep with symptoms. States she mentioned issue to her provider and symptoms have worsened in the last month. She states symptoms get worse when wearing certain shoes. Skin is normal color, states toes looked dark after being in the sun. Toes look fine now and she can feel coldness and sensation. States it feels like pins and needles. Denies swelling. Denies heel pain currently.  Pt requesting an appointment when she return from trip out of town. Explained to patient that due to symptoms she needs to be seen sooner. Pt states she is going out of town Sunday and declined UC.    1. ONSET: When did the pain start?      States she is not sure.  2. LOCATION: Where is the pain located?      Feet  3. PAIN: How bad is the pain?    (Scale 1-10; or mild, moderate, severe)     States pain is not bad, just feel uncomfortable/. 4. WORK OR EXERCISE: Has there been any recent work or exercise that involved this part of the body?      No  5. CAUSE: What do you think is causing the foot pain?     States she may have some neuropathy.  6. OTHER SYMPTOMS: Do you  have any other symptoms? (e.g., leg pain, rash, fever, numbness)     Leg pain, feet are hot  Protocols used: Foot Pain-A-AH

## 2024-07-23 NOTE — Telephone Encounter (Signed)
 Patient scheduled for 08/18/2024 as she will be out of town until this time.

## 2024-07-23 NOTE — Telephone Encounter (Signed)
 Patient informed.

## 2024-08-03 ENCOUNTER — Encounter: Payer: Self-pay | Admitting: Sports Medicine

## 2024-08-18 ENCOUNTER — Ambulatory Visit (INDEPENDENT_AMBULATORY_CARE_PROVIDER_SITE_OTHER): Admitting: Physician Assistant

## 2024-08-18 ENCOUNTER — Encounter: Payer: Self-pay | Admitting: Physician Assistant

## 2024-08-18 VITALS — BP 135/66 | HR 69 | Ht 70.0 in | Wt 218.0 lb

## 2024-08-18 DIAGNOSIS — R200 Anesthesia of skin: Secondary | ICD-10-CM

## 2024-08-18 DIAGNOSIS — R202 Paresthesia of skin: Secondary | ICD-10-CM | POA: Diagnosis not present

## 2024-08-18 DIAGNOSIS — I872 Venous insufficiency (chronic) (peripheral): Secondary | ICD-10-CM | POA: Diagnosis not present

## 2024-08-18 DIAGNOSIS — I878 Other specified disorders of veins: Secondary | ICD-10-CM | POA: Insufficient documentation

## 2024-08-18 LAB — POCT UA - MICROALBUMIN
Albumin/Creatinine Ratio, Urine, POC: <30
Creatinine, POC: 300 mg/dL
Microalbumin Ur, POC: 30 mg/L

## 2024-08-18 NOTE — Patient Instructions (Addendum)
 Will schedule EMG to look for neuropathy Suspect Chronic venous stasis HO given for prevention Wear compression stockings as often as you can Get labs today

## 2024-08-18 NOTE — Progress Notes (Unsigned)
 Established Patient Office Visit  Subjective   Patient ID: Donna [name], female    DOB: [birth year]  Age: 73 y.o. MRN: [id number]  HPI Discussed the use of AI scribe software for clinical note transcription with the patient, who gave verbal consent to proceed.  History of Present Illness Donna [name] is a 73 year old female who presents with intermittent foot swelling and numbness bilaterally but maybe more on right than left.  Lower extremity edema - Swelling and sensation of awareness in both feet, more pronounced when wearing shoes with ties, requiring loosening during the day - Swelling alternates between feet, with one foot often more affected than the other - Swelling extends to ankles and occasionally into calves - Sensation of heat in the feet, with relief from placing feet on a cold floor - Symptoms not present today, attributed to use of compression stockings during recent long flights, which provided relief - No correlation between activity levels and symptom relief  Distal lower extremity paresthesia - Numbness and tingling in toes for a couple of years - Toes feel 'thick' when wiggling them - Sensation remains intact upon poking toes  Associated symptoms and negative review of systems - No pain associated with swelling or paresthesia - No symptoms in hands - No cramping pain with walking - No nocturnal leg jumpiness, though some jumpiness occurred during a flight    ROS See HPI.    Objective:     BP 135/66   Pulse 69   Ht 5' 10 (1.778 m)   Wt 218 lb (98.9 kg)   SpO2 99%   BMI 31.28 kg/m  BP Readings from Last 3 Encounters:  08/18/24 135/66  08/11/23 125/72  10/21/22 128/61   Wt Readings from Last 3 Encounters:  08/18/24 218 lb (98.9 kg)  08/11/23 211 lb (95.7 kg)  10/21/22 198 lb (89.8 kg)      Physical Exam Constitutional:      Appearance: Normal appearance.  HENT:     Head: Normocephalic.  Cardiovascular:     Rate and Rhythm:  Normal rate and regular rhythm.     Heart sounds: Normal heart sounds.  Pulmonary:     Effort: Pulmonary effort is normal.     Breath sounds: Normal breath sounds.  Musculoskeletal:        General: No swelling, tenderness, deformity or signs of injury.     Right lower leg: No edema.     Left lower leg: No edema.  Neurological:     General: No focal deficit present.     Mental Status: She is alert and oriented to person, place, and time.     Sensory: No sensory deficit.     Motor: No weakness.     Coordination: Coordination normal.     Gait: Gait normal.  Psychiatric:        Mood and Affect: Mood normal.    UA-negative for any microalbumin   The 10-year ASCVD risk score (Arnett DK, et al., 2019) is: 15.9%    Assessment & Plan:  Donna [name] was seen today for medical management of chronic issues.  Diagnoses and all orders for this visit:  Chronic venous insufficiency of lower extremity -     B12 and Folate Panel -     VITAMIN D  25 Hydroxy (Vit-D Deficiency, Fractures) -     CMP14+EGFR -     CBC w/Diff/Platelet -     TSH + free T4 -     Sed Rate (ESR) -  C-reactive protein -     POCT UA - Microalbumin  Numbness and tingling of both feet -     B12 and Folate Panel -     VITAMIN D  25 Hydroxy (Vit-D Deficiency, Fractures) -     CMP14+EGFR -     CBC w/Diff/Platelet -     TSH + free T4 -     Sed Rate (ESR) -     C-reactive protein -     Ambulatory referral to Neurology   Assessment and Plan Assessment & Plan Chronic venous insuffiencey of lower extremities Intermittent swelling, hotness, and numbness in feet, exacerbated by long flights. Varicosities suggest chronic venous stasis due to aging and gravity. Swelling indicates venous stasis over neuropathy. - Recommend regular use of compression stockings. - Encourage regular walking and low-impact exercises. - Advise standing and calf raises every 30 minutes when seated. - Order urine test for proteinuria(negative  microalbumin) - Provided educational materials on chronic venous stasis.  Peripheral neuropathy, unspecified Intermittent numbness and tingling in toes. Differential includes diabetic and idiopathic neuropathy, but diabetes is absent. Consider low back issues. - Offer EMG testing if symptoms persist or worsen. - labs ordered to check for vitamin and thyroid  deficiencies and evidence of excessive inflammation    Return in about 6 weeks (around 09/29/2024).    Donna Sarrazin, PA-C

## 2024-08-19 LAB — TSH+FREE T4
Free T4: 1.26 ng/dL (ref 0.82–1.77)
TSH: 1.21 u[IU]/mL (ref 0.450–4.500)

## 2024-08-19 LAB — CBC WITH DIFFERENTIAL/PLATELET
Basophils Absolute: 0.1 x10E3/uL (ref 0.0–0.2)
Basos: 1 %
EOS (ABSOLUTE): 0.1 x10E3/uL (ref 0.0–0.4)
Eos: 2 %
Hematocrit: 42.5 % (ref 34.0–46.6)
Hemoglobin: 13.4 g/dL (ref 11.1–15.9)
Immature Grans (Abs): 0 x10E3/uL (ref 0.0–0.1)
Immature Granulocytes: 0 %
Lymphocytes Absolute: 2 x10E3/uL (ref 0.7–3.1)
Lymphs: 33 %
MCH: 30 pg (ref 26.6–33.0)
MCHC: 31.5 g/dL (ref 31.5–35.7)
MCV: 95 fL (ref 79–97)
Monocytes Absolute: 0.5 x10E3/uL (ref 0.1–0.9)
Monocytes: 9 %
Neutrophils Absolute: 3.5 x10E3/uL (ref 1.4–7.0)
Neutrophils: 55 %
Platelets: 267 x10E3/uL (ref 150–450)
RBC: 4.47 x10E6/uL (ref 3.77–5.28)
RDW: 13.1 % (ref 11.7–15.4)
WBC: 6.2 x10E3/uL (ref 3.4–10.8)

## 2024-08-19 LAB — CMP14+EGFR
ALT: 16 IU/L (ref 0–32)
AST: 21 IU/L (ref 0–40)
Albumin: 3.7 g/dL — ABNORMAL LOW (ref 3.8–4.8)
Alkaline Phosphatase: 117 IU/L (ref 49–135)
BUN/Creatinine Ratio: 15 (ref 12–28)
BUN: 17 mg/dL (ref 8–27)
Bilirubin Total: 0.4 mg/dL (ref 0.0–1.2)
CO2: 22 mmol/L (ref 20–29)
Calcium: 9.1 mg/dL (ref 8.7–10.3)
Chloride: 108 mmol/L — ABNORMAL HIGH (ref 96–106)
Creatinine, Ser: 1.13 mg/dL — ABNORMAL HIGH (ref 0.57–1.00)
Globulin, Total: 2.4 g/dL (ref 1.5–4.5)
Glucose: 89 mg/dL (ref 70–99)
Potassium: 4.7 mmol/L (ref 3.5–5.2)
Sodium: 142 mmol/L (ref 134–144)
Total Protein: 6.1 g/dL (ref 6.0–8.5)
eGFR: 51 mL/min/1.73 — ABNORMAL LOW (ref >59)

## 2024-08-19 LAB — B12 AND FOLATE PANEL
Folate: >20 ng/mL (ref >3.0)
Vitamin B-12: 540 pg/mL (ref 232–1245)

## 2024-08-19 LAB — VITAMIN D 25 HYDROXY (VIT D DEFICIENCY, FRACTURES): Vit D, 25-Hydroxy: 31.9 ng/mL (ref 30.0–100.0)

## 2024-08-19 LAB — C-REACTIVE PROTEIN: CRP: 6 mg/L (ref 0–10)

## 2024-08-19 LAB — SEDIMENTATION RATE: Sed Rate: 19 mm/h (ref 0–40)

## 2024-08-23 ENCOUNTER — Ambulatory Visit: Payer: Self-pay | Admitting: Physician Assistant

## 2024-08-23 NOTE — Progress Notes (Signed)
 Lynleigh,   B12 and folate look great.  Vitamin D  normal range but low normal. Increase by 2000 units of d3 a day.  Hemoglobin and WBC look good.  Thyroid  looks great.  Inflammatory markers are low.  Albumin low-increase protein in diet.  GFR dropped to 51. Recheck in 4 weeks. If under 60 consider medications to help decrease kidney function decline.

## 2024-09-06 DIAGNOSIS — H31011 Macula scars of posterior pole (postinflammatory) (post-traumatic), right eye: Secondary | ICD-10-CM | POA: Diagnosis not present

## 2024-09-06 DIAGNOSIS — H3530 Unspecified macular degeneration: Secondary | ICD-10-CM | POA: Diagnosis not present

## 2024-09-06 DIAGNOSIS — H4322 Crystalline deposits in vitreous body, left eye: Secondary | ICD-10-CM | POA: Diagnosis not present

## 2024-09-06 DIAGNOSIS — Z961 Presence of intraocular lens: Secondary | ICD-10-CM | POA: Diagnosis not present

## 2024-09-13 DIAGNOSIS — Z129 Encounter for screening for malignant neoplasm, site unspecified: Secondary | ICD-10-CM | POA: Diagnosis not present

## 2024-09-13 DIAGNOSIS — L821 Other seborrheic keratosis: Secondary | ICD-10-CM | POA: Diagnosis not present

## 2024-09-13 DIAGNOSIS — L718 Other rosacea: Secondary | ICD-10-CM | POA: Diagnosis not present

## 2024-09-13 DIAGNOSIS — L820 Inflamed seborrheic keratosis: Secondary | ICD-10-CM | POA: Diagnosis not present

## 2024-09-13 DIAGNOSIS — Z859 Personal history of malignant neoplasm, unspecified: Secondary | ICD-10-CM | POA: Diagnosis not present

## 2024-09-13 DIAGNOSIS — D1801 Hemangioma of skin and subcutaneous tissue: Secondary | ICD-10-CM | POA: Diagnosis not present

## 2024-09-28 ENCOUNTER — Encounter: Payer: Self-pay | Admitting: Physician Assistant

## 2024-09-28 ENCOUNTER — Ambulatory Visit: Admitting: Physician Assistant

## 2024-09-28 VITALS — BP 130/65 | HR 86 | Ht 70.0 in | Wt 222.0 lb

## 2024-09-28 DIAGNOSIS — Z5320 Procedure and treatment not carried out because of patient's decision for unspecified reasons: Secondary | ICD-10-CM | POA: Diagnosis not present

## 2024-09-28 DIAGNOSIS — N3281 Overactive bladder: Secondary | ICD-10-CM

## 2024-09-28 DIAGNOSIS — N393 Stress incontinence (female) (male): Secondary | ICD-10-CM

## 2024-09-28 DIAGNOSIS — R6889 Other general symptoms and signs: Secondary | ICD-10-CM | POA: Insufficient documentation

## 2024-09-28 DIAGNOSIS — G5793 Unspecified mononeuropathy of bilateral lower limbs: Secondary | ICD-10-CM | POA: Diagnosis not present

## 2024-09-28 DIAGNOSIS — L601 Onycholysis: Secondary | ICD-10-CM | POA: Diagnosis not present

## 2024-09-28 DIAGNOSIS — I872 Venous insufficiency (chronic) (peripheral): Secondary | ICD-10-CM | POA: Diagnosis not present

## 2024-09-28 MED ORDER — ROPINIROLE HCL 0.25 MG PO TABS: ORAL_TABLET | ORAL | 0 refills | Status: AC

## 2024-09-28 MED ORDER — CICLOPIROX 8 % EX SOLN: Freq: Every day | CUTANEOUS | 1 refills | Status: DC

## 2024-09-28 MED ORDER — OXYBUTYNIN CHLORIDE ER 5 MG PO TB24: 5.0000 mg | ORAL_TABLET | Freq: Every day | ORAL | 0 refills | Status: AC

## 2024-09-29 DIAGNOSIS — N393 Stress incontinence (female) (male): Secondary | ICD-10-CM | POA: Insufficient documentation

## 2024-09-29 DIAGNOSIS — Z5320 Procedure and treatment not carried out because of patient's decision for unspecified reasons: Secondary | ICD-10-CM | POA: Insufficient documentation

## 2024-09-29 DIAGNOSIS — N3281 Overactive bladder: Secondary | ICD-10-CM | POA: Insufficient documentation

## 2024-09-29 NOTE — Progress Notes (Signed)
 Established Patient Office Visit  Subjective   Patient ID: Donna [name], female    DOB: 1951/11/26  Age: 73 y.o. MRN: [id number]  Chief Complaint  Patient presents with   Medical Management of Chronic Issues    HPI .SABRADiscussed the use of AI scribe software for clinical note transcription with the patient, who gave verbal consent to proceed.  History of Present Illness Donna [name] is a 73 year old female who presents to the clinic for follow up on chronic venous stasis and heat sensation of feet.   Peripheral edema and heat sensation - Intermittent swelling in the feet, improved but still occasionally present - Feet often feel hot, especially during summer months - No sensation of shoes feeling tight, possibly due to tying them looser - Compression stockings worn during long trips - Increased physical activity to improve circulation - Grandson noticed her foot was hot to the touch, but she does not currently perceive heat in her feet  Altered sensation in toes - Change in sensation in the toes, present for a long duration - No tingling sensation - Altered sensation when wiggling toes - No associated back pain or pain in other areas  Nail changes - Nail changes over the past six months, including ridges and white spots - Uses nail polish to cover nail changes - Concerned about possible toenail fungus - Has used toenail fungus treatment in the past - Takes multivitamin, vitamin D , and calcium   Urinary incontinence - Stress incontinence, particularly when coughing due to recent cold - Wears pads, especially when traveling - Recognizes need for pelvic floor strengthening  Medication intolerance - Not taking cholesterol medication due to feeling funny after use    ROS See HPI.    Objective:     BP 130/65   Pulse 86   Ht 5' 10 (1.778 m)   Wt 222 lb (100.7 kg)   SpO2 99%   BMI 31.85 kg/m  BP Readings from Last 3 Encounters:  09/28/24 130/65   08/18/24 135/66  08/11/23 125/72   Wt Readings from Last 3 Encounters:  09/28/24 222 lb (100.7 kg)  08/18/24 218 lb (98.9 kg)  08/11/23 211 lb (95.7 kg)      Physical Exam Constitutional:      Appearance: Normal appearance. She is obese.  HENT:     Head: Normocephalic.  Musculoskeletal:     Comments: Bilateral feet normal color with good capillary refill, 2+ pedal pulses, no edema, no warmth. Microfilament test with great sensation of both feet.   Toenails of both great toes have prominent nail ridges and whitish discoloration at the tips of toenails.   Neurological:     General: No focal deficit present.     Mental Status: She is alert and oriented to person, place, and time.  Psychiatric:        Mood and Affect: Mood normal.       The 10-year ASCVD risk score (Arnett DK, et al., 2019) is: 14.9%    Assessment & Plan:  SABRASABRAKeiandra was seen today for medical management of chronic issues.  Diagnoses and all orders for this visit:  Chronic venous insufficiency of lower extremity  Onycholysis -     Fe+TIBC+Fer -     ANA,IFA RA Diag Pnl w/rflx Tit/Patn -     Vitamin B7 -     CMP14+EGFR -     Vitamin B3 -     Vitamin A -     ciclopirox  (PENLAC ) 8 % solution;  Apply topically at bedtime. Apply over nail and surrounding skin. Apply daily over previous coat. After seven (7) days, may remove with alcohol and continue cycle.  Sensation of feeling hot -     rOPINIRole (REQUIP) 0.25 MG tablet; Take one to three tablets at bedtime for restless leg. -     ANA,IFA RA Diag Pnl w/rflx Tit/Patn -     Vitamin B7 -     CMP14+EGFR -     Vitamin B3 -     Vitamin A -     Ambulatory referral to Neurology  OAB (overactive bladder) -     oxybutynin (DITROPAN-XL) 5 MG 24 hr tablet; Take 1 tablet (5 mg total) by mouth daily. For overactive bladder.  Stress incontinence -     oxybutynin (DITROPAN-XL) 5 MG 24 hr tablet; Take 1 tablet (5 mg total) by mouth daily. For overactive  bladder.  Statin declined  Neuropathy of both feet -     Ambulatory referral to Neurology   Assessment & Plan Chronic venous insufficiency Chronic venous insufficiency with intermittent swelling and heat sensation in feet. Differential includes neuropathy and restless legs syndrome. Neuropathy testing with EMG considered. - Recommend wearing compression stockings during travel. - Encourage calf raises and movement. - Order EMG with neurology to assess nerve conduction.  Restless legs syndrome Restless legs syndrome suspected due to heat sensation in feet, exacerbated by inactivity and long flights. - Prescribe Requip at night or as needed.  Onycholysis Onycholysis with nail ridges and white spots. Possible causes include trauma or nail polish use. Vitamin deficiency considered. - Check vitamin levels to rule out deficiencies.  Onychomycosis (toenail fungus) Chronic onychomycosis with persistent toenail changes. - Prescribe Penlac  nail lacquer.  Stress urinary incontinence and overactive bladder Stress urinary incontinence exacerbated by coughing. Overactive bladder symptoms present. Discussed pelvic floor therapy and medication options. Oxybutynin prescribed with potential side effects. - Prescribe oxybutynin for overactive bladder. - Recommend pelvic floor therapy for stress urinary incontinence. - Discuss potential side effects of oxybutynin.  General Health Maintenance Discussed vitamin levels and kidney function. Previous drop in kidney function noted. - Recheck kidney function. - Order blood tests to check vitamin levels.   Follow up in 3 months  [name], PA-C

## 2024-10-04 ENCOUNTER — Ambulatory Visit: Payer: Self-pay | Admitting: Physician Assistant

## 2024-10-04 DIAGNOSIS — E66811 Obesity, class 1: Secondary | ICD-10-CM

## 2024-10-04 NOTE — Progress Notes (Signed)
 Although ANA positive titer came back at a really low titer ratio indicating false positive.

## 2024-10-05 LAB — CMP14+EGFR
ALT: 14 IU/L (ref 0–32)
AST: 21 IU/L (ref 0–40)
Albumin: 3.8 g/dL (ref 3.8–4.8)
Alkaline Phosphatase: 116 IU/L (ref 49–135)
BUN/Creatinine Ratio: 17 (ref 12–28)
BUN: 19 mg/dL (ref 8–27)
Bilirubin Total: 0.4 mg/dL (ref 0.0–1.2)
CO2: 22 mmol/L (ref 20–29)
Calcium: 9.9 mg/dL (ref 8.7–10.3)
Chloride: 103 mmol/L (ref 96–106)
Creatinine, Ser: 1.15 mg/dL — ABNORMAL HIGH (ref 0.57–1.00)
Globulin, Total: 2.5 g/dL (ref 1.5–4.5)
Glucose: 87 mg/dL (ref 70–99)
Potassium: 4.4 mmol/L (ref 3.5–5.2)
Sodium: 138 mmol/L (ref 134–144)
Total Protein: 6.3 g/dL (ref 6.0–8.5)
eGFR: 50 mL/min/1.73 — ABNORMAL LOW (ref >59)

## 2024-10-05 LAB — FANA STAINING PATTERNS: Nucleolar Pattern: 1:80 {titer}

## 2024-10-05 LAB — IRON,TIBC AND FERRITIN PANEL
Ferritin: 98 ng/mL (ref 15–150)
Iron Saturation: 35 % (ref 15–55)
Iron: 98 ug/dL (ref 27–139)
Total Iron Binding Capacity: 281 ug/dL (ref 250–450)
UIBC: 183 ug/dL (ref 118–369)

## 2024-10-05 LAB — VITAMIN B7: Vitamin B7: 0.29 ng/mL (ref 0.05–0.83)

## 2024-10-05 LAB — ANA,IFA RA DIAG PNL W/RFLX TIT/PATN
ANA Titer 1: POSITIVE — AB
Cyclic Citrullin Peptide Ab: 10 U (ref 0–19)
Rheumatoid fact SerPl-aCnc: 11.5 [IU]/mL (ref <14.0)

## 2024-10-05 LAB — VITAMIN A: Vitamin A: 34.5 ug/dL (ref 22.0–69.5)

## 2024-10-05 LAB — VITAMIN B3
Nicotinamide: 10.5 ng/mL (ref 5.2–72.1)
Nicotinic Acid: <5 ng/mL (ref 0.0–5.0)

## 2024-10-18 ENCOUNTER — Telehealth: Payer: Self-pay

## 2024-10-18 NOTE — Telephone Encounter (Unsigned)
 Copied from CRM #[id number]. Topic: Clinical - Prescription Issue >> Oct 18, 2024  9:32 AM Avram MATSU wrote: Reason for CRM: patient stated se spoke with he provider about getting medication for her nails. Patient dos not remember what the medication was called, pease advise [phone] (M)

## 2024-10-20 NOTE — Telephone Encounter (Signed)
 Will you follow up on neurology referral for EMGS?

## 2024-10-25 NOTE — Telephone Encounter (Signed)
Orders Placed This Encounter  °Procedures  ° Amb Ref to Medical Weight Management  °  Referral Priority:   Routine  °  Referral Type:   Consultation  °  Number of Visits Requested:   1  ° ° °

## 2024-11-02 ENCOUNTER — Encounter: Payer: Self-pay | Admitting: Nurse Practitioner

## 2024-11-02 ENCOUNTER — Ambulatory Visit: Admitting: Nurse Practitioner

## 2024-11-02 ENCOUNTER — Telehealth: Payer: Self-pay

## 2024-11-02 VITALS — BP 128/77 | HR 78 | Temp 97.7°F | Ht 69.0 in | Wt 219.0 lb

## 2024-11-02 DIAGNOSIS — E66811 Obesity, class 1: Secondary | ICD-10-CM

## 2024-11-02 DIAGNOSIS — E785 Hyperlipidemia, unspecified: Secondary | ICD-10-CM | POA: Diagnosis not present

## 2024-11-02 DIAGNOSIS — Z6832 Body mass index (BMI) 32.0-32.9, adult: Secondary | ICD-10-CM

## 2024-11-02 DIAGNOSIS — Z7184 Encounter for health counseling related to travel: Secondary | ICD-10-CM

## 2024-11-02 MED ORDER — MEFLOQUINE HCL 250 MG PO TABS: 250.0000 mg | ORAL_TABLET | ORAL | 0 refills | Status: AC

## 2024-11-02 NOTE — Telephone Encounter (Signed)
 Sent to pharmacy

## 2024-11-02 NOTE — Telephone Encounter (Signed)
 Copied from CRM #[id number]. Topic: Clinical - Medication Question >> Nov 02, 2024  2:58 PM Gustabo D wrote: Mefloquine - needed for trip to [place] says she has got it before  She needs 12 tablets - to take before and after she will be gone for a month.   Pt wants to be called or messaged on Mychart when it has been sent to [facility].

## 2024-11-02 NOTE — Progress Notes (Signed)
 Office: [phone]  /  Fax: [phone]   Initial Visit  Donna [name] was seen in clinic today to evaluate for obesity. She is interested in losing weight to improve overall health and reduce the risk of weight related complications. She presents today to review program treatment options, initial physical assessment, and evaluation.     She was referred by: PCP  When asked what else they would like to accomplish? She states: Adopt a healthier eating pattern and lifestyle, Improve energy levels and physical activity, Improve existing medical conditions, and Improve quality of life  She is going to [place] the month of January 2026  When asked how has your weight affected you? She states: Contributed to medical problems, Having fatigue, and Having poor endurance  Some associated conditions: chronic venous insufficiency LEE, orthostatic hypotension, bilateral hearing loss, neck pain, plantar fasciitis, OA of left knee, OAB, chronic venous stasis, osteopenia, OAB, HLD (statin declined), RLS  Contributing factors: family history of obesity, consumption of processed foods, use of obesogenic medications: Steroids, moderate to high levels of stress, and need for convenient foods  Weight promoting medications identified: Steroids  Current nutrition plan: None  Current level of physical activity: None  Current or previous pharmacotherapy: Phentermine   Response to medication: Lost weight initially but was unable to sustain weight loss   Past medical history includes:   Past Medical History:  Diagnosis Date   Allergy    Arthritis    Rosacea    Tachycardia    Travel advice encounter 03/17/2023     Objective:   BP 128/77   Pulse 78   Temp 97.7 F (36.5 C)   Ht 5' 9 (1.753 m)   Wt 219 lb (99.3 kg)   SpO2 98%   BMI 32.34 kg/m  She was weighed on the bioimpedance scale: Body mass index is 32.34 kg/m.  Peak Weight:250 lbs , Body Fat%:44.7%, Visceral Fat Rating:14, Weight  trend over the last 12 months: Increasing  General:  Alert, oriented and cooperative. Patient is in no acute distress.  Respiratory: Normal respiratory effort, no problems with respiration noted   Gait: able to ambulate independently  Mental Status: Normal mood and affect. Normal behavior. Normal judgment and thought content.   DIAGNOSTIC DATA REVIEWED:  BMET    Component Value Date/Time   NA 138 09/28/2024 1037   K 4.4 09/28/2024 1037   CL 103 09/28/2024 1037   CO2 22 09/28/2024 1037   GLUCOSE 87 09/28/2024 1037   GLUCOSE 75 02/19/2022 0843   BUN 19 09/28/2024 1037   CREATININE 1.15 (H) 09/28/2024 1037   CREATININE 1.04 (H) 02/19/2022 0843   CALCIUM  9.9 09/28/2024 1037   GFRNONAA 62 08/31/2018 0841   GFRAA 71 08/31/2018 0841   No results found for: HGBA1C No results found for: INSULIN CBC    Component Value Date/Time   WBC 6.2 08/18/2024 1150   WBC 5.5 02/19/2022 0843   RBC 4.47 08/18/2024 1150   RBC 4.65 02/19/2022 0843   HGB 13.4 08/18/2024 1150   HCT 42.5 08/18/2024 1150   PLT 267 08/18/2024 1150   MCV 95 08/18/2024 1150   MCH 30.0 08/18/2024 1150   MCH 31.2 02/19/2022 0843   MCHC 31.5 08/18/2024 1150   MCHC 33.5 02/19/2022 0843   RDW 13.1 08/18/2024 1150   Iron/TIBC/Ferritin/ %Sat    Component Value Date/Time   IRON 98 09/28/2024 1037   TIBC 281 09/28/2024 1037   FERRITIN 98 09/28/2024 1037   IRONPCTSAT 35 09/28/2024 1037  Lipid Panel     Component Value Date/Time   CHOL 212 (H) 08/13/2023 0856   TRIG 132 08/13/2023 0856   HDL 78 08/13/2023 0856   CHOLHDL 2.7 08/13/2023 0856   CHOLHDL 2.5 02/19/2022 0843   VLDL 26 06/18/2016 0804   LDLCALC 111 (H) 08/13/2023 0856   LDLCALC 100 (H) 02/19/2022 0843   Hepatic Function Panel     Component Value Date/Time   PROT 6.3 09/28/2024 1037   ALBUMIN 3.8 09/28/2024 1037   AST 21 09/28/2024 1037   ALT 14 09/28/2024 1037   ALKPHOS 116 09/28/2024 1037   BILITOT 0.4 09/28/2024 1037      Component  Value Date/Time   TSH 1.210 08/18/2024 1150     Assessment and Plan:   Hyperlipidemia, unspecified hyperlipidemia type Statin declined Has discussed with PCP  Obesity, Class I, BMI 30-34.9        Obesity Treatment / Action Plan:  Patient will work on garnering support from family and friends to begin weight loss journey. Will work on eliminating or reducing the presence of highly palatable, calorie dense foods in the home. Will complete provided nutritional and psychosocial assessment questionnaire before the next appointment. Will be scheduled for indirect calorimetry to determine resting energy expenditure in a fasting state.  This will allow us  to create a reduced calorie, high-protein meal plan to promote loss of fat mass while preserving muscle mass. Counseled on the health benefits of losing 5%-15% of total body weight. Was counseled on nutritional approaches to weight loss and benefits of reducing processed foods and consuming plant-based foods and high quality protein as part of nutritional weight management. Was counseled on pharmacotherapy and role as an adjunct in weight management.   Obesity Education Performed Today:  She was weighed on the bioimpedance scale and results were discussed and documented in the synopsis.  We discussed obesity as a disease and the importance of a more detailed evaluation of all the factors contributing to the disease.  We discussed the importance of long term lifestyle changes which include nutrition, exercise and behavioral modifications as well as the importance of customizing this to her specific health and social needs.  We discussed the benefits of reaching a healthier weight to alleviate the symptoms of existing conditions and reduce the risks of the biomechanical, metabolic and psychological effects of obesity.  Donna [name] appears to be in the action stage of change and states they are ready to start intensive lifestyle  modifications and behavioral modifications.  30 minutes was spent today on this visit including the above counseling, pre-visit chart review, and post-visit documentation.  Reviewed by clinician on day of visit: allergies, medications, problem list, medical history, surgical history, family history, social history, and previous encounter notes pertinent to obesity diagnosis.    Corean SAUNDERS Brice Potteiger FNP-C

## 2024-11-03 NOTE — Telephone Encounter (Signed)
 Patient advised.

## 2024-11-04 DIAGNOSIS — D519 Vitamin B12 deficiency anemia, unspecified: Secondary | ICD-10-CM | POA: Diagnosis not present

## 2024-11-04 DIAGNOSIS — E559 Vitamin D deficiency, unspecified: Secondary | ICD-10-CM | POA: Diagnosis not present

## 2024-11-04 DIAGNOSIS — G609 Hereditary and idiopathic neuropathy, unspecified: Secondary | ICD-10-CM | POA: Diagnosis not present

## 2024-11-04 DIAGNOSIS — R202 Paresthesia of skin: Secondary | ICD-10-CM | POA: Diagnosis not present

## 2024-11-04 DIAGNOSIS — R634 Abnormal weight loss: Secondary | ICD-10-CM | POA: Diagnosis not present

## 2024-11-08 ENCOUNTER — Encounter: Payer: Self-pay | Admitting: Bariatrics

## 2024-11-08 ENCOUNTER — Ambulatory Visit: Admitting: Bariatrics

## 2024-11-08 VITALS — BP 136/78 | HR 77 | Ht 69.0 in | Wt 220.0 lb

## 2024-11-08 DIAGNOSIS — Z6832 Body mass index (BMI) 32.0-32.9, adult: Secondary | ICD-10-CM | POA: Diagnosis not present

## 2024-11-08 DIAGNOSIS — E559 Vitamin D deficiency, unspecified: Secondary | ICD-10-CM | POA: Diagnosis not present

## 2024-11-08 DIAGNOSIS — E785 Hyperlipidemia, unspecified: Secondary | ICD-10-CM | POA: Diagnosis not present

## 2024-11-08 DIAGNOSIS — Z0000 Encounter for general adult medical examination without abnormal findings: Secondary | ICD-10-CM | POA: Diagnosis not present

## 2024-11-08 DIAGNOSIS — E669 Obesity, unspecified: Secondary | ICD-10-CM | POA: Diagnosis not present

## 2024-11-08 DIAGNOSIS — E6609 Other obesity due to excess calories: Secondary | ICD-10-CM

## 2024-11-08 DIAGNOSIS — R5383 Other fatigue: Secondary | ICD-10-CM

## 2024-11-08 DIAGNOSIS — Z1331 Encounter for screening for depression: Secondary | ICD-10-CM | POA: Diagnosis not present

## 2024-11-08 DIAGNOSIS — R0602 Shortness of breath: Secondary | ICD-10-CM | POA: Diagnosis not present

## 2024-11-08 DIAGNOSIS — R7309 Other abnormal glucose: Secondary | ICD-10-CM | POA: Diagnosis not present

## 2024-11-08 NOTE — Progress Notes (Signed)
 At a Glance:  Vitals BP: 136/78 Pulse Rate: 77 SpO2: 98 %   Anthropometric Measurements Height: 5' 9 (1.753 m) Weight: 220 lb (99.8 kg) BMI (Calculated): 32.47 Starting Weight: 220lb Peak Weight: 250lb Waist Measurement : 45 inches   Body Composition  Body Fat %: 45.2 % Fat Mass (lbs): 99.8 lbs Muscle Mass (lbs): 114.6 lbs Total Body Water (lbs): 84.8 lbs Visceral Fat Rating : 14   Other Clinical Data Fasting: yes Labs: yes Today's Visit #: 1 Starting Date: 11/08/24    EKG: Normal sinus rhythm, rate 72.  Indirect Calorimeter:   Resting Metabolic Rate ( RMR):  RMR (actual): 1512 kcal RMR (calculated): 1678 kcal The calculated basal metabolic rate is 8321 kcal thus her basal metabolic rate is better than expected.  Plan:   Indirect calorimeter completed, interpreted and reviewed with patient today and allowed to ask questions.  Discussed the implications for the chosen plan and exercise based on the RMR reading.  Will consider repeating the RMR in the future based on weight loss.    Chief Complaint:  Obesity   Subjective:  Nema Oatley (MR# [id number]) is a 73 y.o. female who presents for evaluation and treatment of obesity and related comorbidities.   Felisa is currently in the action stage of change and ready to dedicate time achieving and maintaining a healthier weight. Kaelene is interested in becoming our patient and working on intensive lifestyle modifications including (but not limited to) diet and exercise for weight loss.  Kalista has been struggling with her weight. She has been unsuccessful in either losing weight, maintaining weight loss, or reaching her healthy weight goal.  Leonarda's habits were reviewed today and are as follows: she thinks her family will eat healthier with her, she has been heavy most of her life, she has significant food cravings issues, and she struggles with emotional eating.  Current or previous  pharmacotherapy: Phentermine   Response to medication: Lost weight initially but was unable to sustain weight loss  Other Fatigue Sanda denies daytime somnolence and denies waking up still tired.  Keana generally gets 7 hours of sleep per night, and states that she has generally restful sleep. Snoring is present. Apneic episodes are not present. Epworth Sleepiness Score is 8.   Shortness of Breath Merari notes increasing shortness of breath with exercising and seems to be worsening over time with weight gain. She notes getting out of breath sooner with activity than she used to. This has not gotten worse recently. Kiamesha denies shortness of breath at rest or orthopnea.  Depression Screen Fraidy's Food and Mood (modified PHQ-9) score was 6. 5-9 mild depression     08/12/2023    6:32 AM  Depression screen PHQ 2/9  Decreased Interest 0  Down, Depressed, Hopeless 0  PHQ - 2 Score 0     Assessment and Plan:   Other Fatigue Collie does feel that her weight is causing her energy to be lower than it should be. Fatigue may be related to obesity, depression or many other causes. Labs will be ordered, and in the meanwhile, Josselin will focus on self care including making healthy food choices, increasing physical activity and focusing on stress reduction.  Shortness of Breath Aiden does not feel that she gets out of breath more easily that she used to when she exercises. Hend's shortness of breath appears to be obesity related and exercise induced. She has agreed to work on weight loss and gradually increase exercise to treat her exercise induced  shortness of breath. Will continue to monitor closely.  Health Maintenance:   Obesity   Plan: Will do EKG, indirect calorimetry, and labs.     Vitamin D  Deficiency Vitamin D  is not at goal of 50.  Most recent vitamin D  level was 31.9. She is at risk for vitamin D  deficiency due to obesity.  She is on OTC vitamin D3 2000 IU  daily. Lab Results  Component Value Date   VD25OH 31.9 08/18/2024    Plan: Continue vitamin D  OTC  Will check for vitamin D  deficiency.   Hyperlipidemia LDL is not at goal. Medication(s): None, was prescribed a statin, but no explanation.  Cardiovascular risk factors: advanced age (older than 73 for men, 21 for women), dyslipidemia, obesity (BMI >= 30 kg/m2), and sedentary lifestyle  Lab Results  Component Value Date   CHOL 212 (H) 08/13/2023   HDL 78 08/13/2023   LDLCALC 111 (H) 08/13/2023   TRIG 132 08/13/2023   CHOLHDL 2.7 08/13/2023   Lab Results  Component Value Date   ALT 14 09/28/2024   AST 21 09/28/2024   ALKPHOS 116 09/28/2024   BILITOT 0.4 09/28/2024   The 10-year ASCVD risk score (Arnett DK, et al., 2019) is: 16.1%   Values used to calculate the score:     Age: 69 years     Clincally relevant sex: Female     Is Non-Hispanic African American: No     Diabetic: No     Tobacco smoker: No     Systolic Blood Pressure: 136 mmHg     Is BP treated: No     HDL Cholesterol: 78 mg/dL     Total Cholesterol: 212 mg/dL  Plan:  Continue statin.  Information sheet on healthy vs unhealthy fats.  Will avoid all trans fats.  Will read labels Will minimize saturated fats except the following: low fat meats in moderation, diary, and limited dark chocolate.  Increase Omega 3 in foods, and consider an Omega 3 supplement.   Elevated glucose:   At risk for insulin  resistance, prediabetes, and diabetes secondary to obesity.   Plan:  Will do Hemoglobin A1c and insulin  levels.      Korrin had a positive depression screening. Depression is commonly associated with obesity and often results in emotional eating behaviors. We will monitor this closely and work on CBT to help improve the non-hunger eating patterns. Referral to Psychology may be required if no improvement is seen as she continues in our clinic.    Previous labs reviewed today. Date: 08/18/24 CMP, Vit D, Vit  B12, and CRP, CBC  Labs done today CMP, Insulin , HgbA1c, Thyroid  Panel, and CBC   Generalized Obesity: BMI (Calculated): 32.47   Aquarius is currently in the action stage of change and her goal is to begin weight loss efforts. I recommend Alana begin the structured treatment plan as follows:  She has agreed to Category 2 Plan  Exercise goals: Older adults should follow the adult guidelines. When older adults cannot meet the adult guidelines, they should be as physically active as their abilities and conditions will allow.  Behavioral modification strategies:increasing lean protein intake, increasing vegetables, increase H2O intake, no skipping meals, keeping healthy foods in the home, and avoiding temptations  She was informed of the importance of frequent follow-up visits to maximize her success with intensive lifestyle modifications for her multiple health conditions. She was informed we would discuss her lab results at her next visit unless there is a critical issue that needs  to be addressed sooner. Mende agreed to keep her next visit at the agreed upon time to discuss these results.  Objective:  General: Cooperative, alert, well developed, in no acute distress. HEENT: Conjunctivae and lids unremarkable. Cardiovascular: Regular rhythm.  Lungs: Normal work of breathing. Neurologic: No focal deficits.   Lab Results  Component Value Date   CREATININE 1.15 (H) 09/28/2024   BUN 19 09/28/2024   NA 138 09/28/2024   K 4.4 09/28/2024   CL 103 09/28/2024   CO2 22 09/28/2024   Lab Results  Component Value Date   ALT 14 09/28/2024   AST 21 09/28/2024   ALKPHOS 116 09/28/2024   BILITOT 0.4 09/28/2024   No results found for: HGBA1C No results found for: INSULIN  Lab Results  Component Value Date   TSH 1.210 08/18/2024   Lab Results  Component Value Date   CHOL 212 (H) 08/13/2023   HDL 78 08/13/2023   LDLCALC 111 (H) 08/13/2023   TRIG 132 08/13/2023   CHOLHDL 2.7  08/13/2023   Lab Results  Component Value Date   WBC 6.2 08/18/2024   HGB 13.4 08/18/2024   HCT 42.5 08/18/2024   MCV 95 08/18/2024   PLT 267 08/18/2024   Lab Results  Component Value Date   IRON 98 09/28/2024   TIBC 281 09/28/2024   FERRITIN 98 09/28/2024    Attestation Statements:  Applicable history such as the following:  allergies, medications, problem list, medical history, surgical history, family history, social history, and previous encounter notes reviewed by clinician on day of visit:  Time spent on visit in care of the patient today including the items listed below was 70 minutes.    30 minutes were spent talking about the history, 35 minutes for face to face counseling implementing the plan, discussing the specifics of how to arrange meals, meal planning, water intake.   I spent face to face time discussing his/her plan, including breakfast, additional breakfast options, lunch, and dinner options, grocery list, and snacks.  I reviewed her indirect calorimetry. I discussed the implications for the diet plan.    Discussed the bio-impedence test (fat %, muscle mass, and water weight) and allowed the patient to ask questions.   Discussed the following information sheets: Category 2, Grocery List, 100 Calorie Snacks, 200 Calorie Snacks, and Microwave Meals.   I reviewed the labs which were ordered from her visit on 08/18/24.   I additionally spent time documenting, reviewing, and checking the codes before submitting.   This may have been prepared with the assistance of Engineer, Civil (consulting).  Occasional wrong-word or sound-a-like substitutions may have occurred due to the inherent limitations of voice recognition software.    Clayborne Daring, DO

## 2024-11-09 LAB — COMPREHENSIVE METABOLIC PANEL WITH GFR
ALT: 18 IU/L (ref 0–32)
AST: 25 IU/L (ref 0–40)
Albumin: 3.8 g/dL (ref 3.8–4.8)
Alkaline Phosphatase: 114 IU/L (ref 49–135)
BUN/Creatinine Ratio: 14 (ref 12–28)
BUN: 14 mg/dL (ref 8–27)
Bilirubin Total: 0.4 mg/dL (ref 0.0–1.2)
CO2: 25 mmol/L (ref 20–29)
Calcium: 9.2 mg/dL (ref 8.7–10.3)
Chloride: 104 mmol/L (ref 96–106)
Creatinine, Ser: 0.98 mg/dL (ref 0.57–1.00)
Globulin, Total: 2.7 g/dL (ref 1.5–4.5)
Glucose: 86 mg/dL (ref 70–99)
Potassium: 4.7 mmol/L (ref 3.5–5.2)
Sodium: 140 mmol/L (ref 134–144)
Total Protein: 6.5 g/dL (ref 6.0–8.5)
eGFR: 61 mL/min/1.73 (ref >59)

## 2024-11-09 LAB — CBC WITH DIFFERENTIAL/PLATELET
Basophils Absolute: 0.1 x10E3/uL (ref 0.0–0.2)
Basos: 1 %
EOS (ABSOLUTE): 0.1 x10E3/uL (ref 0.0–0.4)
Eos: 1 %
Hematocrit: 42.1 % (ref 34.0–46.6)
Hemoglobin: 13.7 g/dL (ref 11.1–15.9)
Immature Grans (Abs): 0 x10E3/uL (ref 0.0–0.1)
Immature Granulocytes: 0 %
Lymphocytes Absolute: 1.9 x10E3/uL (ref 0.7–3.1)
Lymphs: 33 %
MCH: 30.2 pg (ref 26.6–33.0)
MCHC: 32.5 g/dL (ref 31.5–35.7)
MCV: 93 fL (ref 79–97)
Monocytes Absolute: 0.4 x10E3/uL (ref 0.1–0.9)
Monocytes: 7 %
Neutrophils Absolute: 3.3 x10E3/uL (ref 1.4–7.0)
Neutrophils: 58 %
Platelets: 284 x10E3/uL (ref 150–450)
RBC: 4.53 x10E6/uL (ref 3.77–5.28)
RDW: 12.9 % (ref 11.7–15.4)
WBC: 5.7 x10E3/uL (ref 3.4–10.8)

## 2024-11-09 LAB — TSH+T4F+T3FREE
Free T4: 1.16 ng/dL (ref 0.82–1.77)
T3, Free: 2.9 pg/mL (ref 2.0–4.4)
TSH: 1.6 u[IU]/mL (ref 0.450–4.500)

## 2024-11-09 LAB — INSULIN, RANDOM: INSULIN: 14.3 u[IU]/mL (ref 2.6–24.9)

## 2024-11-09 LAB — HEMOGLOBIN A1C
Est. average glucose Bld gHb Est-mCnc: 111 mg/dL
Hgb A1c MFr Bld: 5.5 % (ref 4.8–5.6)

## 2024-11-22 ENCOUNTER — Ambulatory Visit: Admitting: Bariatrics

## 2024-11-22 ENCOUNTER — Encounter: Payer: Self-pay | Admitting: Bariatrics

## 2024-11-22 VITALS — BP 95/65 | HR 80 | Ht 69.0 in | Wt 213.0 lb

## 2024-11-22 DIAGNOSIS — Z6831 Body mass index (BMI) 31.0-31.9, adult: Secondary | ICD-10-CM

## 2024-11-22 DIAGNOSIS — E88819 Insulin resistance, unspecified: Secondary | ICD-10-CM

## 2024-11-22 DIAGNOSIS — E669 Obesity, unspecified: Secondary | ICD-10-CM

## 2024-11-22 DIAGNOSIS — E66811 Obesity, class 1: Secondary | ICD-10-CM

## 2024-11-22 DIAGNOSIS — Z0000 Encounter for general adult medical examination without abnormal findings: Secondary | ICD-10-CM

## 2024-11-22 NOTE — Progress Notes (Signed)
 "                                                                                                                        First follow-up after initial visit.        WEIGHT SUMMARY AND BIOMETRICS  Weight Lost Since Last Visit: 7lb  Weight Gained Since Last Visit: 0   Vitals BP: 95/65 Pulse Rate: 80 SpO2: 99 %   Anthropometric Measurements Height: 5' 9 (1.753 m) Weight: 213 lb (96.6 kg) BMI (Calculated): 31.44 Weight at Last Visit: 220lb Weight Lost Since Last Visit: 7lb Weight Gained Since Last Visit: 0 Starting Weight: 220lb Total Weight Loss (lbs): 7 lb (3.175 kg) Peak Weight: 250lb   Body Composition  Body Fat %: 43.5 % Fat Mass (lbs): 92.8 lbs Muscle Mass (lbs): 114.4 lbs Total Body Water (lbs): 80.8 lbs Visceral Fat Rating : 13   Other Clinical Data Fasting: no Labs: no Today's Visit #: 2 Starting Date: 11/08/24    OBESITY Donna [name] is here to discuss her progress with her obesity treatment plan along with follow-up of her obesity related diagnoses.    Nutrition Plan: the Category 2 plan - 90% adherence.  Current exercise: walking  Interim History:  She is down 7 lbs since her initial visit. Eating all of the food on the plan., Protein intake is as prescribed, Is not skipping meals, and Water intake is adequate.  Initial positives regarding the dietary plan: She is in a routine for breakfast and lunch.  Initial challenges regarding  the dietary plan: She has been hungry at times, usually afternoon and evening.   Hunger is moderately controlled.  Cravings are moderately controlled.  Assessment/Plan:   Insulin  Resistance Donna [name] has had elevated fasting insulin  readings. Goal is HgbA1c < 5.7, fasting insulin  at l0 or less, and preferably at 5.  She reports  polyphagia. Medication(s): none Lab Results  Component Value Date   HGBA1C 5.5 11/08/2024   Lab Results  Component Value Date   INSULIN  14.3 11/08/2024    Plan Medication(s): none Will  work on the agreed upon plan. Will minimize refined carbohydrates ( sweets and starches), and focus more on complex carbohydrates.  Increase the micronutrients found in leafy greens, which include magnesium, polyphenols, and vitamin C which have been postulated to help with insulin  sensitivity. Minimize fast food and cook more meals at home.  Increase fiber to 25 to 30 grams daily.  Discussed portion size and brands of foods that would be helpful to maintain her plan and make her weight loss journey more profitable. Lunch option handout given and discussed. Will pay attention to increasing protein, up fiber and water to satiate hunger.   Health Maintenance:   Obesity   Plan: Labs reviewed today (CMP,  CBC, HgbA1c, insulin , vitamin D , and thyroid  panel).     Generalized Obesity: Current BMI BMI (Calculated): 31.44   Pharmacotherapy Plan  Donna [name] is currently in the action stage of change. As such, her goal  is to continue with weight loss efforts.  She has agreed to the Category 2 plan.  Exercise goals: Older adults should determine their level of effort for physical activity relative to their level of fitness.  She is walking some days and will increase her walking.  Behavioral modification strategies: increasing lean protein intake, no meal skipping, meal planning , increase water intake, better snacking choices, planning for success, keep healthy foods in the home, weigh protein portions, measure portion sizes, and mindful eating.  Donna [name] has agreed to follow-up with our clinic in 4 to 5 weeks.   Objective:   VITALS: Per patient if applicable, see vitals. GENERAL: Alert and in no acute distress. CARDIOPULMONARY: No increased WOB. Speaking in clear sentences.  PSYCH: Pleasant and cooperative. Speech normal rate and rhythm. Affect is appropriate. Insight and judgement are appropriate. Attention is focused, linear, and appropriate.  NEURO: Oriented as arrived to appointment on  time with no prompting.   Attestation Statements:   This was prepared with the assistance of Engineer, Civil (consulting).  Occasional wrong-word or sound-a-like substitutions may have occurred due to the inherent limitations of voice recognition software.   Clayborne Daring, DO  "

## 2025-01-03 ENCOUNTER — Ambulatory Visit: Admitting: Family Medicine

## 2025-01-04 ENCOUNTER — Telehealth: Admitting: Bariatrics

## 2025-01-04 DIAGNOSIS — E669 Obesity, unspecified: Secondary | ICD-10-CM

## 2025-01-04 DIAGNOSIS — E88819 Insulin resistance, unspecified: Secondary | ICD-10-CM

## 2025-01-04 DIAGNOSIS — Z6831 Body mass index (BMI) 31.0-31.9, adult: Secondary | ICD-10-CM

## 2025-01-13 ENCOUNTER — Ambulatory Visit: Admitting: Bariatrics

## 2025-01-17 ENCOUNTER — Ambulatory Visit: Admitting: Family Medicine
